# Patient Record
Sex: Female | Born: 1965
Health system: Southern US, Community
[De-identification: ages and names within clinical notes are randomized; demographics above are authoritative.]

## PROBLEM LIST (undated history)

## (undated) DIAGNOSIS — Z789 Other specified health status: Secondary | ICD-10-CM

## (undated) DIAGNOSIS — Z973 Presence of spectacles and contact lenses: Secondary | ICD-10-CM

## (undated) DIAGNOSIS — K219 Gastro-esophageal reflux disease without esophagitis: Secondary | ICD-10-CM

## (undated) DIAGNOSIS — D649 Anemia, unspecified: Secondary | ICD-10-CM

## (undated) DIAGNOSIS — Z8619 Personal history of other infectious and parasitic diseases: Secondary | ICD-10-CM

## (undated) HISTORY — DX: Personal history of other infectious and parasitic diseases: Z86.19

## (undated) HISTORY — PX: FRACTURE SURGERY: SHX138

## (undated) HISTORY — PX: HEMORROIDECTOMY: SUR656

## (undated) HISTORY — PX: COLONOSCOPY: SHX174

## (undated) HISTORY — PX: INTRAUTERINE DEVICE INSERTION: SHX323

## (undated) HISTORY — DX: Gastro-esophageal reflux disease without esophagitis: K21.9

## (undated) HISTORY — DX: Anemia, unspecified: D64.9

---

## 2009-12-01 DIAGNOSIS — R5381 Other malaise: Secondary | ICD-10-CM | POA: Insufficient documentation

## 2009-12-01 DIAGNOSIS — J309 Allergic rhinitis, unspecified: Secondary | ICD-10-CM | POA: Insufficient documentation

## 2009-12-01 DIAGNOSIS — G43909 Migraine, unspecified, not intractable, without status migrainosus: Secondary | ICD-10-CM | POA: Insufficient documentation

## 2009-12-01 DIAGNOSIS — R41840 Attention and concentration deficit: Secondary | ICD-10-CM | POA: Insufficient documentation

## 2009-12-03 DIAGNOSIS — N765 Ulceration of vagina: Secondary | ICD-10-CM | POA: Insufficient documentation

## 2009-12-03 DIAGNOSIS — M546 Pain in thoracic spine: Secondary | ICD-10-CM | POA: Insufficient documentation

## 2009-12-03 DIAGNOSIS — M549 Dorsalgia, unspecified: Secondary | ICD-10-CM | POA: Insufficient documentation

## 2009-12-03 DIAGNOSIS — D649 Anemia, unspecified: Secondary | ICD-10-CM | POA: Insufficient documentation

## 2009-12-13 DIAGNOSIS — E559 Vitamin D deficiency, unspecified: Secondary | ICD-10-CM | POA: Insufficient documentation

## 2009-12-13 DIAGNOSIS — K5900 Constipation, unspecified: Secondary | ICD-10-CM | POA: Insufficient documentation

## 2012-06-11 ENCOUNTER — Other Ambulatory Visit (HOSPITAL_COMMUNITY)
Admission: RE | Admit: 2012-06-11 | Discharge: 2012-06-11 | Disposition: A | Discharge status: Home / Self Care | Payer: Self-pay | Source: Ambulatory Visit | Attending: Family Medicine | Admitting: Family Medicine

## 2012-06-11 DIAGNOSIS — Z124 Encounter for screening for malignant neoplasm of cervix: Secondary | ICD-10-CM | POA: Insufficient documentation

## 2014-07-21 ENCOUNTER — Emergency Department (HOSPITAL_COMMUNITY): Payer: 59

## 2014-07-21 ENCOUNTER — Emergency Department (HOSPITAL_COMMUNITY)
Admission: EM | Admit: 2014-07-21 | Discharge: 2014-07-21 | Disposition: A | Discharge status: Home / Self Care | Payer: 59 | Attending: Emergency Medicine | Admitting: Emergency Medicine

## 2014-07-21 ENCOUNTER — Encounter (HOSPITAL_COMMUNITY): Payer: Self-pay

## 2014-07-21 DIAGNOSIS — Y9389 Activity, other specified: Secondary | ICD-10-CM | POA: Diagnosis not present

## 2014-07-21 DIAGNOSIS — S161XXA Strain of muscle, fascia and tendon at neck level, initial encounter: Secondary | ICD-10-CM | POA: Insufficient documentation

## 2014-07-21 DIAGNOSIS — Y92410 Unspecified street and highway as the place of occurrence of the external cause: Secondary | ICD-10-CM | POA: Diagnosis not present

## 2014-07-21 DIAGNOSIS — S199XXA Unspecified injury of neck, initial encounter: Secondary | ICD-10-CM | POA: Diagnosis present

## 2014-07-21 DIAGNOSIS — R911 Solitary pulmonary nodule: Secondary | ICD-10-CM | POA: Insufficient documentation

## 2014-07-21 DIAGNOSIS — Y998 Other external cause status: Secondary | ICD-10-CM | POA: Diagnosis not present

## 2014-07-21 LAB — I-STAT CHEM 8, ED
BUN: 13 mg/dL (ref 6–23)
Calcium, Ion: 1.16 mmol/L (ref 1.12–1.23)
Chloride: 103 mmol/L (ref 96–112)
Creatinine, Ser: 1 mg/dL (ref 0.50–1.10)
GLUCOSE: 73 mg/dL (ref 70–99)
HEMATOCRIT: 39 % (ref 36.0–46.0)
HEMOGLOBIN: 13.3 g/dL (ref 12.0–15.0)
POTASSIUM: 3.6 mmol/L (ref 3.5–5.1)
SODIUM: 140 mmol/L (ref 135–145)
TCO2: 25 mmol/L (ref 0–100)

## 2014-07-21 LAB — I-STAT BETA HCG BLOOD, ED (MC, WL, AP ONLY): I-stat hCG, quantitative: <5 m[IU]/mL (ref <5)

## 2014-07-21 MED ORDER — MORPHINE SULFATE 4 MG/ML IJ SOLN
4.0000 mg | Freq: Once | INTRAMUSCULAR | Status: AC
  Administered 2014-07-21: 4 mg via INTRAVENOUS
  Filled 2014-07-21: qty 1

## 2014-07-21 MED ORDER — ONDANSETRON HCL 4 MG/2ML IJ SOLN
4.0000 mg | Freq: Once | INTRAMUSCULAR | Status: AC
  Administered 2014-07-21: 4 mg via INTRAVENOUS
  Filled 2014-07-21: qty 2

## 2014-07-21 MED ORDER — IOHEXOL 300 MG/ML  SOLN
80.0000 mL | Freq: Once | INTRAMUSCULAR | Status: AC | PRN
  Administered 2014-07-21: 80 mL via INTRAVENOUS

## 2014-07-21 MED ORDER — OXYCODONE-ACETAMINOPHEN 5-325 MG PO TABS: ORAL_TABLET | ORAL | Status: DC

## 2014-07-21 MED ORDER — SODIUM CHLORIDE 0.9 % IV SOLN
INTRAVENOUS | Status: DC
  Administered 2014-07-21: 08:00:00 via INTRAVENOUS

## 2014-07-21 NOTE — ED Notes (Signed)
Soft c collar given for support and comfort
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
## 2014-07-21 NOTE — Discharge Instructions (Signed)
Your imaging today shows nodules in your lungs. Your primary care doctor must be made aware of this and further imaging may be ordered. Please make them aware that they will need to obtain records for further management.  Take percocet for breakthrough pain, do not drink alcohol, drive, care for children or do other critical tasks while taking percocet.   It is very important that you take deep breaths to prevent lung collapse and infection.  Take 10 deep breaths every hour to prevent lung collapse.  If you develop cough, fever or shortness of breath return immediately to the emergency room.  Do not hesitate to return to the emergency room for any new, worsening or concerning symptoms.  Please obtain primary care using resource guide below. But the minute you were seen in the emergency room and that they will need to obtain records for further outpatient management.   Cervical Sprain A cervical sprain is an injury in the neck in which the strong, fibrous tissues (ligaments) that connect your neck bones stretch or tear. Cervical sprains can range from mild to severe. Severe cervical sprains can cause the neck vertebrae to be unstable. This can lead to damage of the spinal cord and can result in serious nervous system problems. The amount of time it takes for a cervical sprain to get better depends on the cause and extent of the injury. Most cervical sprains heal in 1 to 3 weeks. CAUSES  Severe cervical sprains may be caused by:   Contact sport injuries (such as from football, rugby, wrestling, hockey, auto racing, gymnastics, diving, martial arts, or boxing).   Motor vehicle collisions.   Whiplash injuries. This is an injury from a sudden forward and backward whipping movement of the head and neck.  Falls.  Mild cervical sprains may be caused by:   Being in an awkward position, such as while cradling a telephone between your ear and shoulder.   Sitting in a chair that does not  offer proper support.   Working at a poorly Marketing executive station.   Looking up or down for long periods of time.  SYMPTOMS   Pain, soreness, stiffness, or a burning sensation in the front, back, or sides of the neck. This discomfort may develop immediately after the injury or slowly, 24 hours or more after the injury.   Pain or tenderness directly in the middle of the back of the neck.   Shoulder or upper back pain.   Limited ability to move the neck.   Headache.   Dizziness.   Weakness, numbness, or tingling in the hands or arms.   Muscle spasms.   Difficulty swallowing or chewing.   Tenderness and swelling of the neck.  DIAGNOSIS  Most of the time your health care provider can diagnose a cervical sprain by taking your history and doing a physical exam. Your health care provider will ask about previous neck injuries and any known neck problems, such as arthritis in the neck. X-rays may be taken to find out if there are any other problems, such as with the bones of the neck. Other tests, such as a CT scan or MRI, may also be needed.  TREATMENT  Treatment depends on the severity of the cervical sprain. Mild sprains can be treated with rest, keeping the neck in place (immobilization), and pain medicines. Severe cervical sprains are immediately immobilized. Further treatment is done to help with pain, muscle spasms, and other symptoms and may include:  Medicines, such as pain  relievers, numbing medicines, or muscle relaxants.   Physical therapy. This may involve stretching exercises, strengthening exercises, and posture training. Exercises and improved posture can help stabilize the neck, strengthen muscles, and help stop symptoms from returning.  HOME CARE INSTRUCTIONS   Put ice on the injured area.   Put ice in a plastic bag.   Place a towel between your skin and the bag.   Leave the ice on for 15-20 minutes, 3-4 times a day.   If your injury was  severe, you may have been given a cervical collar to wear. A cervical collar is a two-piece collar designed to keep your neck from moving while it heals.  Do not remove the collar unless instructed by your health care provider.  If you have long hair, keep it outside of the collar.  Ask your health care provider before making any adjustments to your collar. Minor adjustments may be required over time to improve comfort and reduce pressure on your chin or on the back of your head.  Ifyou are allowed to remove the collar for cleaning or bathing, follow your health care provider's instructions on how to do so safely.  Keep your collar clean by wiping it with mild soap and water and drying it completely. If the collar you have been given includes removable pads, remove them every 1-2 days and hand wash them with soap and water. Allow them to air dry. They should be completely dry before you wear them in the collar.  If you are allowed to remove the collar for cleaning and bathing, wash and dry the skin of your neck. Check your skin for irritation or sores. If you see any, tell your health care provider.  Do not drive while wearing the collar.   Only take over-the-counter or prescription medicines for pain, discomfort, or fever as directed by your health care provider.   Keep all follow-up appointments as directed by your health care provider.   Keep all physical therapy appointments as directed by your health care provider.   Make any needed adjustments to your workstation to promote good posture.   Avoid positions and activities that make your symptoms worse.   Warm up and stretch before being active to help prevent problems.  SEEK MEDICAL CARE IF:   Your pain is not controlled with medicine.   You are unable to decrease your pain medicine over time as planned.   Your activity level is not improving as expected.  SEEK IMMEDIATE MEDICAL CARE IF:   You develop any  bleeding.  You develop stomach upset.  You have signs of an allergic reaction to your medicine.   Your symptoms get worse.   You develop new, unexplained symptoms.   You have numbness, tingling, weakness, or paralysis in any part of your body.  MAKE SURE YOU:   Understand these instructions.  Will watch your condition.  Will get help right away if you are not doing well or get worse. Document Released: 03/19/2007 Document Revised: 05/27/2013 Document Reviewed: 11/27/2012 [facility] Patient Information 2015 [place], [place]. This information is not intended to replace advice given to you by your health care provider. Make sure you discuss any questions you have with your health care provider.  Pulmonary Nodule A pulmonary nodule is a small, round growth of tissue in the lung. Pulmonary nodules can range in size from less than 1/5 inch (4 mm) to a little bigger than an inch (25 mm). Most pulmonary nodules are detected when imaging  tests of the lung are being performed for a different problem. Pulmonary nodules are usually not cancerous (benign). However, some pulmonary nodules are cancerous (malignant). Follow-up treatment or testing is based on the size of the pulmonary nodule and your risk of getting lung cancer.  CAUSES Benign pulmonary nodules can be caused by various things. Some of the causes include:   Bacterial, fungal, or viral infections. This is usually an old infection that is no longer active, but it can sometimes be a current, active infection.  A benign mass of tissue.  Inflammation from conditions such as rheumatoid arthritis.   Abnormal blood vessels in the lungs. Malignant pulmonary nodules can result from lung cancer or from cancers that spread to the lung from other places in the body. SIGNS AND SYMPTOMS Pulmonary nodules usually do not cause symptoms. DIAGNOSIS Most often, pulmonary nodules are found incidentally when an X-ray or CT scan is performed to look  for some other problem in the lung area. To help determine whether a pulmonary nodule is benign or malignant, your health care provider will take a medical history and order a variety of tests. Tests done may include:   Blood tests.  A skin test called a tuberculin test. This test is used to determine if you have been exposed to the germ that causes tuberculosis.   Chest X-rays. If possible, a new X-ray may be compared with X-rays you have had in the past.   CT scan. This test shows smaller pulmonary nodules more clearly than an X-ray.   Positron emission tomography (PET) scan. In this test, a safe amount of a radioactive substance is injected into the bloodstream. Then, the scan takes a picture of the pulmonary nodule. The radioactive substance is eliminated from your body in your urine.   Biopsy. A tiny piece of the pulmonary nodule is removed so it can be checked under a microscope. TREATMENT  Pulmonary nodules that are benign normally do not require any treatment because they usually do not cause symptoms or breathing problems. Your health care provider may want to monitor the pulmonary nodule through follow-up CT scans. The frequency of these CT scans will vary based on the size of the nodule and the risk factors for lung cancer. For example, CT scans will need to be done more frequently if the pulmonary nodule is larger and if you have a history of smoking and a family history of cancer. Further testing or biopsies may be done if any follow-up CT scan shows that the size of the pulmonary nodule has increased. HOME CARE INSTRUCTIONS  Only take over-the-counter or prescription medicines as directed by your health care provider.  Keep all follow-up appointments with your health care provider. SEEK MEDICAL CARE IF:  You have trouble breathing when you are active.   You feel sick or unusually tired.   You do not feel like eating.   You lose weight without trying to.   You  develop chills or night sweats.  SEEK IMMEDIATE MEDICAL CARE IF:  You cannot catch your breath, or you begin wheezing.   You cannot stop coughing.   You cough up blood.   You become dizzy or feel like you are going to pass out.   You have sudden chest pain.   You have a fever or persistent symptoms for more than 2-3 days.   You have a fever and your symptoms suddenly get worse. MAKE SURE YOU:  Understand these instructions.  Will watch your condition.  Will get help right away if you are not doing well or get worse. Document Released: 03/19/2009 Document Revised: 01/22/2013 Document Reviewed: 11/11/2012 [facility] Patient Information 2015 [place], [place]. This information is not intended to replace advice given to you by your health care provider. Make sure you discuss any questions you have with your health care provider.   Emergency Department Resource Guide 1) Find a Doctor and Pay Out of Pocket Although you won't have to find out who is covered by your insurance plan, it is a good idea to ask around and get recommendations. You will then need to call the office and see if the doctor you have chosen will accept you as a new patient and what types of options they offer for patients who are self-pay. Some doctors offer discounts or will set up payment plans for their patients who do not have insurance, but you will need to ask so you aren't surprised when you get to your appointment.  2) Contact Your Local Health Department Not all health departments have doctors that can see patients for sick visits, but many do, so it is worth a call to see if yours does. If you don't know where your local health department is, you can check in your phone book. The CDC also has a tool to help you locate your state's health department, and many state websites also have listings of all of their local health departments.  3) Find a Walk-in Clinic If your illness is not likely to be very severe  or complicated, you may want to try a walk in clinic. These are popping up all over the country in pharmacies, drugstores, and shopping centers. They're usually staffed by nurse practitioners or physician assistants that have been trained to treat common illnesses and complaints. They're usually fairly quick and inexpensive. However, if you have serious medical issues or chronic medical problems, these are probably not your best option.  No Primary Care Doctor: - Call Health Connect at  [phone] - they can help you locate a primary care doctor that  accepts your insurance, provides certain services, etc. - Physician Referral Service- [phone]  Chronic Pain Problems: Organization         Address  Phone   Notes  [facility]  [phone] Patients need to be referred by their primary care doctor.   Medication Assistance: Organization         Address  Phone   Notes  [facility] Medication [facility] [address] [place], [place] [address] [phone] --Must be a resident of [facility] -- Must have NO insurance coverage whatsoever (no Medicaid/ Medicare, etc.) -- The pt. MUST have a primary care doctor that directs their care regularly and follows them in the community   [facility]ist  [phone]   [employer]  [phone]    Agencies that provide inexpensive medical care: Organization         Address  Phone   Notes  [facility]  [phone]   [facility]    [phone]   [facility] [address] [place], [place] [address] [phone]   [facility] [address] [place], [place] [phone]   Planned Parenthood    [phone]   [facility]    [phone]   Community Health and [facility]  [address], [place] Phone:  [phone], Fax:  [phone] Hours of Operation:  9 am - 6 pm, M-F.  Also accepts  Medicaid/Medicare and self-pay.  [facility]  [address], [place] Phone: [phone], Fax: [phone]. Hours of Operation:  8:30 am - 5:30 pm, M-F.  Also accepts Medicaid and self-pay.  [facility] [place] [address], [place] Phone: [phone]   [facility] [address] Natasha Bence [place], [place] [phone], Ext. [phone] Mondays & Thursdays: 7-9 AM.  First 15 patients are seen on a first come, first serve basis.    Medicaid-accepting [facility] Providers:  Organization         Address  Phone   Notes  [facility] [address], [place] [phone] Also accepts self-pay patients.  [facility] [address] Laurell Josephs [place], [place]  [phone]   [facility] [address], [place] [phone]   [facility] [address] [place] [phone]   Renaye Rakers [address], [place]   [phone] Only accepts [place] Access [place] patients after they have their name applied to their card.   Self-Pay (no insurance) in [facility]:  Organization         Address  Phone   Notes  Sickle Cell Patients, [facility] [address] [place], [place] [phone]   [facility] [address] [place], [place] [phone]   [facility] [place] [facility]  [address], [place] [phone]   [facility]/Dr. Osei-Bonsu  [address], [place] or [address], [place] [phone] Phone number for both [place] and [place] locations is the same.  [facility]gent [facility] and [facility] [address], [place] [phone]   [facility] [address], [place] or [address] [phone]   [facility] [address] [place] [phone], phone; [phone], fax Sees patients 1st and 3rd Saturday of every month.  Must not qualify for public or private insurance (i.e. Medicaid, Medicare, [place] Health Choice, Veterans' Benefits)  Household income should be no more than 200% of the poverty level The clinic cannot treat you if you are pregnant or think you are pregnant  Sexually transmitted diseases are not treated at the clinic.    Dental Care: Organization         Address  Phone  Notes  [facility] Department of [facility] [address] [place], [place] [phone] Accepts children up to age 76 who are enrolled in [place] or [place] Health Choice; pregnant women with a Medicaid card; and children who have applied for Medicaid or [place] Health Choice, but were declined, whose parents can pay a reduced fee at time of service.  [facility] Department of [facility]  [address], [place] [phone] Accepts children up to age 74 who are enrolled in [place] or [place] Health Choice; pregnant women with a Medicaid card; and children who have applied for Medicaid or  Health Choice, but were declined, whose parents can pay a reduced fee at time of service.  [facility]  [address] [place], [place] [phone] Patients are seen by appointment only. Walk-ins are not accepted. [facility] will see patients 36 years of age and older. Monday - Tuesday (8am-5pm) Most Wednesdays (8:30-5pm) $30 per visit, cash only  [facility] Adult Dental Access PRO[facility]  [address], [facility] [phone] Patients are seen by appointment only. Walk-ins are not accepted. [facility] will see patients 81 years of age and older. One Wednesday Evening (Monthly: Volunteer Based).  $30 per visit, cash only  [employer]  [phone] for adults; Children under age 41, call Graduate Pediatric Dentistry at [phone]. Children aged  60-14, please call [phone] to request a pediatric application.  Dental services are provided in all areas of dental care including fillings, crowns and bridges, complete and partial dentures, implants, gum treatment, root canals, and extractions. Preventive care is also provided. Treatment is provided to both adults and children. Patients are selected via a lottery and there is often a waiting list.   [facility] [address], [place]  [phone] [email or web address]   [facility] [address] [place], [place] [phone], Ext. [phone] Second and Fourth Thursday of each month, opens at 6:30 AM; Clinic ends at 9 AM.  Patients are seen on a first-come first-served basis, and a limited number are seen during each clinic.   [facility]  [address] Ether Griffins [place], [place] [phone]   Eligibility Requirements You must have lived in [place], [place], or [place] counties for at least the last three months.   You cannot be eligible for state or federal sponsored [place], including [employer], [place], or [employer].   You generally cannot be eligible for healthcare insurance through your employer.    How to apply: Eligibility screenings are held every Tuesday and Wednesday afternoon from 1:00 pm until 4:00 pm. You do not need an appointment for the interview!  [facility] [address], [place], [place] [phone]   [facility] Health Department  [phone]   [facility] Health Department  [phone]   [facility] Health Department  [phone]    Behavioral Health Resources in the Community: Intensive Outpatient Programs Organization         Address  Phone  Notes  [facility] [address] [place], [place] [phone]   [facility] [address] [place], [place] [phone]   [facility] [address],  [place], [place]  [phone]   [facility] Mental Health [address]  [place], [place] [phone] or [phone]   Substance Abuse Resources Organization         Address  Phone  Notes  [facility]  [phone]   [facility]  [phone]   [facility]  [phone]   Floydene Flock  [phone]   Residential & Outpatient Substance Abuse Program  [phone]   Psychological Services Organization         Address  Phone  Notes  [facility]al Health  [phone]   [facility]  [phone]   [facility] Mental Health [address] [place] [phone] or [phone]    Mobile Crisis Teams Organization         Address  Phone  Notes  [facility], Mobile Crisis Care Unit  [phone]   [facility]  [address] [place], [place] [phone]   [facility] [address] [place] [phone]    Self-Help/Support Groups Organization  Address  Phone             Notes  Mental Health Assoc. of  - variety of support groups  336- I7437963 Call for more information  Narcotics Anonymous (NA), [facility] [address], [employer] [place]  2 meetings at this location   Statistician         Address  Phone  Notes  [facility] [address],    [place]  [phone]   [facility]  [address] [place] [address], [place], [place] [phone]   [facility] [address] [place], [place] [phone] Admissions: 8am-3pm M-F  [facility] [address],    [place], [place] [phone]   [facility] [address] [place], [place], [place] [phone]   [facility] [address],  [place], [place] [phone]   [facility] - Intensive Outpatient [address]ure[address], [place], [place] [phone]   [facility]  (Addiction Recovery Care [facility]oc.) [address] [place]  [place], [place] [phone] or [phone]   [facility] ([facility]) [address], [place], [place] [phone] Accepts Medicaid  [facility] [address],  [place] [phone] Substance Abuse/Addiction Treatment   [facility] Organization         Address  Phone  Notes  [facility]  5407583440   Angie Fava, PhD [address] Ervin Knack [place], [place]   [phone] or [phone]   [facility]   [address] [place], [place] [phone]   [facility] [address] [place], [place] [phone] Insurance/Medicaid/sponsorship through [facility] and Families [address]                                    [place], [place] [phone] Therapy/tele-psych/case  [facility] [address], [place] [phone]    Dr. Lolly Mustache  [phone]   [facility] [place]  [employer] [facility] Dept. 1) [address], [place] 2) [address] [place] 3)  [address], [place] [phone]  [phone]   [facility] Child Abuse Hotline 9066406669 or [phone] (After Hours)

## 2014-07-21 NOTE — ED Notes (Signed)
Pt still in CT

## 2014-07-21 NOTE — ED Notes (Signed)
Pt escorted to discharge window. Pt verbalized understanding discharge instructions. In no acute distress.  

## 2014-07-21 NOTE — ED Notes (Signed)
Bed: WA03 Expected date: 07/21/14 Expected time: 6:15 AM Means of arrival: Ambulance Comments: Neck and back pain

## 2014-07-21 NOTE — ED Notes (Signed)
Pt alert and oriented x4. Respirations even and unlabored, bilateral symmetrical rise and fall of chest. Skin warm and dry. In no acute distress. Denies needs.   

## 2014-07-26 ENCOUNTER — Encounter (HOSPITAL_COMMUNITY): Payer: Self-pay | Admitting: Emergency Medicine

## 2014-07-26 ENCOUNTER — Emergency Department (HOSPITAL_COMMUNITY)
Admission: EM | Admit: 2014-07-26 | Discharge: 2014-07-26 | Disposition: A | Discharge status: Home / Self Care | Payer: BC Managed Care – PPO | Attending: Emergency Medicine | Admitting: Emergency Medicine

## 2014-07-26 ENCOUNTER — Emergency Department (HOSPITAL_COMMUNITY): Payer: BC Managed Care – PPO

## 2014-07-26 DIAGNOSIS — S59902A Unspecified injury of left elbow, initial encounter: Secondary | ICD-10-CM | POA: Diagnosis not present

## 2014-07-26 DIAGNOSIS — S6292XB Unspecified fracture of left wrist and hand, initial encounter for open fracture: Secondary | ICD-10-CM

## 2014-07-26 DIAGNOSIS — S4992XA Unspecified injury of left shoulder and upper arm, initial encounter: Secondary | ICD-10-CM | POA: Insufficient documentation

## 2014-07-26 DIAGNOSIS — Y92003 Bedroom of unspecified non-institutional (private) residence as the place of occurrence of the external cause: Secondary | ICD-10-CM | POA: Diagnosis not present

## 2014-07-26 DIAGNOSIS — W06XXXA Fall from bed, initial encounter: Secondary | ICD-10-CM | POA: Insufficient documentation

## 2014-07-26 DIAGNOSIS — S62632B Displaced fracture of distal phalanx of right middle finger, initial encounter for open fracture: Secondary | ICD-10-CM | POA: Diagnosis not present

## 2014-07-26 DIAGNOSIS — S6992XA Unspecified injury of left wrist, hand and finger(s), initial encounter: Secondary | ICD-10-CM | POA: Diagnosis present

## 2014-07-26 DIAGNOSIS — Y998 Other external cause status: Secondary | ICD-10-CM | POA: Diagnosis not present

## 2014-07-26 DIAGNOSIS — Y9389 Activity, other specified: Secondary | ICD-10-CM | POA: Insufficient documentation

## 2014-07-26 MED ORDER — ONDANSETRON HCL 4 MG/2ML IJ SOLN: 4.0000 mg | Freq: Once | INTRAMUSCULAR | Status: DC

## 2014-07-26 MED ORDER — HYDROCODONE-ACETAMINOPHEN 5-325 MG PO TABS: 1.0000 | ORAL_TABLET | Freq: Four times a day (QID) | ORAL | Status: DC | PRN

## 2014-07-26 MED ORDER — HYDROCODONE-ACETAMINOPHEN 5-325 MG PO TABS
2.0000 | ORAL_TABLET | Freq: Once | ORAL | Status: AC
  Administered 2014-07-26: 2 via ORAL
  Filled 2014-07-26: qty 2

## 2014-07-26 MED ORDER — ONDANSETRON 4 MG PO TBDP
4.0000 mg | ORAL_TABLET | Freq: Once | ORAL | Status: AC
  Administered 2014-07-26: 4 mg via ORAL
  Filled 2014-07-26: qty 1

## 2014-07-26 MED ORDER — OXYCODONE-ACETAMINOPHEN 5-325 MG PO TABS
2.0000 | ORAL_TABLET | Freq: Once | ORAL | Status: DC
  Filled 2014-07-26: qty 2

## 2014-07-26 NOTE — ED Notes (Signed)
Ortho tech at bedside 

## 2014-07-26 NOTE — Discharge Instructions (Signed)
Cast or Splint Care °Casts and splints support injured limbs and keep bones from moving while they heal. It is important to care for your cast or splint at home.   °HOME CARE INSTRUCTIONS °· Keep the cast or splint uncovered during the drying period. It can take 24 to 48 hours to dry if it is made of plaster. A fiberglass cast will dry in less than 1 hour. °· Do not rest the cast on anything harder than a pillow for the first 24 hours. °· Do not put weight on your injured limb or apply pressure to the cast until your health care provider gives you permission. °· Keep the cast or splint dry. Wet casts or splints can lose their shape and may not support the limb as well. A wet cast that has lost its shape can also create harmful pressure on your skin when it dries. Also, wet skin can become infected. °· Cover the cast or splint with a plastic bag when bathing or when out in the rain or snow. If the cast is on the trunk of the body, take sponge baths until the cast is removed. °· If your cast does become wet, dry it with a towel or a blow dryer on the cool setting only. °· Keep your cast or splint clean. Soiled casts may be wiped with a moistened cloth. °· Do not place any hard or soft foreign objects under your cast or splint, such as cotton, toilet paper, lotion, or powder. °· Do not try to scratch the skin under the cast with any object. The object could get stuck inside the cast. Also, scratching could lead to an infection. If itching is a problem, use a blow dryer on a cool setting to relieve discomfort. °· Do not trim or cut your cast or remove padding from inside of it. °· Exercise all joints next to the injury that are not immobilized by the cast or splint. For example, if you have a long leg cast, exercise the hip joint and toes. If you have an arm cast or splint, exercise the shoulder, elbow, thumb, and fingers. °· Elevate your injured arm or leg on 1 or 2 pillows for the first 1 to 3 days to decrease  swelling and pain. It is best if you can comfortably elevate your cast so it is higher than your heart. °SEEK MEDICAL CARE IF:  °· Your cast or splint cracks. °· Your cast or splint is too tight or too loose. °· You have unbearable itching inside the cast. °· Your cast becomes wet or develops a soft spot or area. °· You have a bad smell coming from inside your cast. °· You get an object stuck under your cast. °· Your skin around the cast becomes red or raw. °· You have new pain or worsening pain after the cast has been applied. °SEEK IMMEDIATE MEDICAL CARE IF:  °· You have fluid leaking through the cast. °· You are unable to move your fingers or toes. °· You have discolored (blue or white), cool, painful, or very swollen fingers or toes beyond the cast. °· You have tingling or numbness around the injured area. °· You have severe pain or pressure under the cast. °· You have any difficulty with your breathing or have shortness of breath. °· You have chest pain. °Document Released: 05/19/2000 Document Revised: 03/12/2013 Document Reviewed: 11/28/2012 °[employer]® Patient Information ©2015 [employer]. This information is not intended to replace advice given to you by your health care   provider. Make sure you discuss any questions you have with your health care provider.  Hand Fracture, Metacarpals Fractures of metacarpals are breaks in the bones of the hand. They extend from the knuckles to the wrist. These bones can undergo many types of fractures. There are different ways of treating these fractures, all of which may be correct. TREATMENT  Hand fractures can be treated with:   Non-reduction - The fracture is casted without changing the positions of the fracture (bone pieces) involved. This fracture is usually left in a cast for 4 to 6 weeks or as your caregiver thinks necessary.  Closed reduction - The bones are moved back into position without surgery and then casted.  ORIF (open reduction and internal  fixation) - The fracture site is opened and the bone pieces are fixed into place with some type of hardware, such as screws, etc. They are then casted. Your caregiver will discuss the type of fracture you have and the treatment that should be best for that problem. If surgery is chosen, let your caregivers know about the following.  LET YOUR CAREGIVERS KNOW ABOUT:  Allergies.  Medications you are taking, including herbs, eye drops, over the counter medications, and creams.  Use of steroids (by mouth or creams).  Previous problems with anesthetics or novocaine.  Possibility of pregnancy.  History of blood clots (thrombophlebitis).  History of bleeding or blood problems.  Previous surgeries.  Other health problems. AFTER THE PROCEDURE After surgery, you will be taken to the recovery area where a nurse will watch and check your progress. Once you are awake, stable, and taking fluids well, barring other problems, you'll be allowed to go home. Once home, an ice pack applied to your operative site may help with pain and keep the swelling down. HOME CARE INSTRUCTIONS   Follow your caregiver's instructions as to activities, exercises, physical therapy, and driving a car.  Daily exercise is helpful for keeping range of motion and strength. Exercise as instructed.  To lessen swelling, keep the injured hand elevated above the level of your heart as much as possible.  Apply ice to the injury for 15-20 minutes each hour while awake for the first 2 days. Put the ice in a plastic bag and place a thin towel between the bag of ice and your cast.  Move the fingers of your casted hand several times a day.  If a plaster or fiberglass cast was applied:  Do not try to scratch the skin under the cast using a sharp or pointed object.  Check the skin around the cast every day. You may put lotion on red or sore areas.  Keep your cast dry. Your cast can be protected during bathing with a plastic bag.  Do not put your cast into the water.  If a plaster splint was applied:  Wear your splint for as long as directed by your caregiver or until seen again.  Do not get your splint wet. Protect it during bathing with a plastic bag.  You may loosen the elastic bandage around the splint if your fingers start to get numb, tingle, get cold or turn blue.  Do not put pressure on your cast or splint; this may cause it to break. Especially, do not lean plaster casts on hard surfaces for 24 hours after application.  Take medications as directed by your caregiver.  Only take over-the-counter or prescription medicines for pain, discomfort, or fever as directed by your caregiver.  Follow-up as provided by  your caregiver. This is very important in order to avoid permanent injury or disability and chronic pain. SEEK MEDICAL CARE IF:   Increased bleeding (more than a small spot) from beneath your cast or splint if there is beneath the cast as with an open reduction.  Redness, swelling, or increasing pain in the wound or from beneath your cast or splint.  Pus coming from wound or from beneath your cast or splint.  An unexplained oral temperature above 102 F (38.9 C) develops, or as your caregiver suggests.  A foul smell coming from the wound or dressing or from beneath your cast or splint.  You have a problem moving any of your fingers. SEEK IMMEDIATE MEDICAL CARE IF:   You develop a rash  You have difficulty breathing  You have any allergy problems If you do not have a window in your cast for observing the wound, a discharge or minor bleeding may show up as a stain on the outside of your cast. Report these findings to your caregiver. MAKE SURE YOU:   Understand these instructions.  Will watch your condition.  Will get help right away if you are not doing well or get worse. Document Released: 05/22/2005 Document Revised: 08/14/2011 Document Reviewed: 01/09/2008 [facility] Patient  Information 2015 [place], [place]. This information is not intended to replace advice given to you by your health care provider. Make sure you discuss any questions you have with your health care provider.
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
## 2014-07-27 ENCOUNTER — Other Ambulatory Visit: Payer: Self-pay | Admitting: Orthopedic Surgery

## 2014-07-27 ENCOUNTER — Encounter (HOSPITAL_BASED_OUTPATIENT_CLINIC_OR_DEPARTMENT_OTHER): Payer: Self-pay | Admitting: *Deleted

## 2014-07-28 ENCOUNTER — Encounter (HOSPITAL_BASED_OUTPATIENT_CLINIC_OR_DEPARTMENT_OTHER)
Admission: RE | Disposition: A | Discharge status: Home / Self Care | Payer: Self-pay | Source: Ambulatory Visit | Attending: Orthopedic Surgery

## 2014-07-28 ENCOUNTER — Encounter (HOSPITAL_BASED_OUTPATIENT_CLINIC_OR_DEPARTMENT_OTHER): Payer: Self-pay | Admitting: *Deleted

## 2014-07-28 ENCOUNTER — Ambulatory Visit (HOSPITAL_BASED_OUTPATIENT_CLINIC_OR_DEPARTMENT_OTHER): Payer: BC Managed Care – PPO | Admitting: Anesthesiology

## 2014-07-28 ENCOUNTER — Ambulatory Visit (HOSPITAL_BASED_OUTPATIENT_CLINIC_OR_DEPARTMENT_OTHER)
Admission: RE | Admit: 2014-07-28 | Discharge: 2014-07-28 | Disposition: A | Discharge status: Home / Self Care | Payer: BC Managed Care – PPO | Source: Ambulatory Visit | Attending: Orthopedic Surgery | Admitting: Orthopedic Surgery

## 2014-07-28 DIAGNOSIS — Y9289 Other specified places as the place of occurrence of the external cause: Secondary | ICD-10-CM | POA: Insufficient documentation

## 2014-07-28 DIAGNOSIS — S62613A Displaced fracture of proximal phalanx of left middle finger, initial encounter for closed fracture: Secondary | ICD-10-CM | POA: Insufficient documentation

## 2014-07-28 DIAGNOSIS — Y939 Activity, unspecified: Secondary | ICD-10-CM | POA: Diagnosis not present

## 2014-07-28 DIAGNOSIS — S62615A Displaced fracture of proximal phalanx of left ring finger, initial encounter for closed fracture: Secondary | ICD-10-CM | POA: Diagnosis not present

## 2014-07-28 DIAGNOSIS — S62617A Displaced fracture of proximal phalanx of left little finger, initial encounter for closed fracture: Secondary | ICD-10-CM | POA: Insufficient documentation

## 2014-07-28 DIAGNOSIS — Z888 Allergy status to other drugs, medicaments and biological substances status: Secondary | ICD-10-CM | POA: Insufficient documentation

## 2014-07-28 HISTORY — PX: CLOSED REDUCTION METACARPAL WITH PERCUTANEOUS PINNING: SHX5613

## 2014-07-28 HISTORY — DX: Other specified health status: Z78.9

## 2014-07-28 HISTORY — DX: Presence of spectacles and contact lenses: Z97.3

## 2014-07-28 SURGERY — CLOSED REDUCTION, FRACTURE, METACARPAL BONE, WITH PERCUTANEOUS PINNING
Anesthesia: General | Site: Finger | Laterality: Left

## 2014-07-28 MED ORDER — HYDROMORPHONE HCL 1 MG/ML IJ SOLN: 0.2500 mg | INTRAMUSCULAR | Status: DC | PRN

## 2014-07-28 MED ORDER — ONDANSETRON HCL 4 MG/2ML IJ SOLN
INTRAMUSCULAR | Status: DC | PRN
  Administered 2014-07-28: 4 mg via INTRAVENOUS

## 2014-07-28 MED ORDER — FENTANYL CITRATE 0.05 MG/ML IJ SOLN
50.0000 ug | INTRAMUSCULAR | Status: DC | PRN
  Administered 2014-07-28: 100 ug via INTRAVENOUS

## 2014-07-28 MED ORDER — PROPOFOL INFUSION 10 MG/ML OPTIME
INTRAVENOUS | Status: DC | PRN
  Administered 2014-07-28: 140 ug/kg/min via INTRAVENOUS

## 2014-07-28 MED ORDER — MIDAZOLAM HCL 2 MG/2ML IJ SOLN
INTRAMUSCULAR | Status: AC
  Filled 2014-07-28: qty 2

## 2014-07-28 MED ORDER — FENTANYL CITRATE 0.05 MG/ML IJ SOLN
INTRAMUSCULAR | Status: AC
  Filled 2014-07-28: qty 2

## 2014-07-28 MED ORDER — ONDANSETRON HCL 4 MG/2ML IJ SOLN: 4.0000 mg | Freq: Once | INTRAMUSCULAR | Status: DC | PRN

## 2014-07-28 MED ORDER — MIDAZOLAM HCL 2 MG/2ML IJ SOLN
1.0000 mg | INTRAMUSCULAR | Status: DC | PRN
  Administered 2014-07-28: 2 mg via INTRAVENOUS

## 2014-07-28 MED ORDER — MEPERIDINE HCL 25 MG/ML IJ SOLN: 6.2500 mg | INTRAMUSCULAR | Status: DC | PRN

## 2014-07-28 MED ORDER — LACTATED RINGERS IV SOLN
INTRAVENOUS | Status: DC
Start: 2014-07-28 — End: 2014-07-28
  Administered 2014-07-28 (×2): via INTRAVENOUS

## 2014-07-28 MED ORDER — CEFAZOLIN SODIUM-DEXTROSE 2-3 GM-% IV SOLR
2.0000 g | INTRAVENOUS | Status: AC
  Administered 2014-07-28: 2 g via INTRAVENOUS

## 2014-07-28 MED ORDER — CEFAZOLIN SODIUM-DEXTROSE 2-3 GM-% IV SOLR
INTRAVENOUS | Status: AC
  Filled 2014-07-28: qty 50

## 2014-07-28 MED ORDER — HYDROCODONE-ACETAMINOPHEN 5-325 MG PO TABS: ORAL_TABLET | ORAL | Status: DC

## 2014-07-28 MED ORDER — CHLORHEXIDINE GLUCONATE 4 % EX LIQD: 60.0000 mL | Freq: Once | CUTANEOUS | Status: DC

## 2014-07-28 MED ORDER — FENTANYL CITRATE 0.05 MG/ML IJ SOLN
INTRAMUSCULAR | Status: AC
  Filled 2014-07-28: qty 6

## 2014-07-28 SURGICAL SUPPLY — 58 items
BANDAGE ELASTIC 3 VELCRO ST LF (GAUZE/BANDAGES/DRESSINGS) ×3 IMPLANT
BLADE MINI RND TIP GREEN BEAV (BLADE) IMPLANT
BLADE SURG 15 STRL LF DISP TIS (BLADE) ×1 IMPLANT
BLADE SURG 15 STRL SS (BLADE) ×2
BNDG ELASTIC 2 VLCR STRL LF (GAUZE/BANDAGES/DRESSINGS) IMPLANT
BNDG ESMARK 4X9 LF (GAUZE/BANDAGES/DRESSINGS) ×3 IMPLANT
BNDG GAUZE ELAST 4 BULKY (GAUZE/BANDAGES/DRESSINGS) ×3 IMPLANT
CHLORAPREP W/TINT 26ML (MISCELLANEOUS) ×3 IMPLANT
CORDS BIPOLAR (ELECTRODE) IMPLANT
COVER BACK TABLE 60X90IN (DRAPES) ×3 IMPLANT
COVER MAYO STAND STRL (DRAPES) ×3 IMPLANT
CUFF TOURNIQUET SINGLE 18IN (TOURNIQUET CUFF) ×3 IMPLANT
DRAPE EXTREMITY T 121X128X90 (DRAPE) ×3 IMPLANT
DRAPE OEC MINIVIEW 54X84 (DRAPES) ×3 IMPLANT
DRAPE SURG 17X23 STRL (DRAPES) ×3 IMPLANT
GAUZE SPONGE 4X4 12PLY STRL (GAUZE/BANDAGES/DRESSINGS) ×3 IMPLANT
GAUZE XEROFORM 1X8 LF (GAUZE/BANDAGES/DRESSINGS) ×3 IMPLANT
GLOVE BIO SURGEON STRL SZ7 (GLOVE) ×3 IMPLANT
GLOVE BIO SURGEON STRL SZ7.5 (GLOVE) ×3 IMPLANT
GLOVE BIOGEL PI IND STRL 7.5 (GLOVE) ×1 IMPLANT
GLOVE BIOGEL PI IND STRL 8 (GLOVE) ×1 IMPLANT
GLOVE BIOGEL PI IND STRL 8.5 (GLOVE) ×1 IMPLANT
GLOVE BIOGEL PI INDICATOR 7.5 (GLOVE) ×2
GLOVE BIOGEL PI INDICATOR 8 (GLOVE) ×2
GLOVE BIOGEL PI INDICATOR 8.5 (GLOVE) ×2
GLOVE SURG ORTHO 8.0 STRL STRW (GLOVE) ×3 IMPLANT
GOWN STRL REUS W/ TWL LRG LVL3 (GOWN DISPOSABLE) ×2 IMPLANT
GOWN STRL REUS W/ TWL XL LVL3 (GOWN DISPOSABLE) ×1 IMPLANT
GOWN STRL REUS W/TWL LRG LVL3 (GOWN DISPOSABLE) ×4
GOWN STRL REUS W/TWL XL LVL3 (GOWN DISPOSABLE) ×8 IMPLANT
K-WIRE .035X4 (WIRE) ×21 IMPLANT
NEEDLE HYPO 22GX1.5 SAFETY (NEEDLE) IMPLANT
NEEDLE HYPO 25X1 1.5 SAFETY (NEEDLE) IMPLANT
NS IRRIG 1000ML POUR BTL (IV SOLUTION) ×3 IMPLANT
PACK BASIN DAY SURGERY FS (CUSTOM PROCEDURE TRAY) ×3 IMPLANT
PAD CAST 3X4 CTTN HI CHSV (CAST SUPPLIES) IMPLANT
PAD CAST 4YDX4 CTTN HI CHSV (CAST SUPPLIES) IMPLANT
PADDING CAST ABS 4INX4YD NS (CAST SUPPLIES) ×2
PADDING CAST ABS COTTON 4X4 ST (CAST SUPPLIES) ×1 IMPLANT
PADDING CAST COTTON 3X4 STRL (CAST SUPPLIES)
PADDING CAST COTTON 4X4 STRL (CAST SUPPLIES)
SLEEVE SCD COMPRESS KNEE MED (MISCELLANEOUS) ×3 IMPLANT
SLING ARM MED ADULT FOAM STRAP (SOFTGOODS) ×3 IMPLANT
SPLINT PLASTER CAST XFAST 3X15 (CAST SUPPLIES) ×20 IMPLANT
SPLINT PLASTER CAST XFAST 4X15 (CAST SUPPLIES) IMPLANT
SPLINT PLASTER XTRA FAST SET 4 (CAST SUPPLIES)
SPLINT PLASTER XTRA FASTSET 3X (CAST SUPPLIES) ×40
STOCKINETTE 4X48 STRL (DRAPES) ×3 IMPLANT
SUT ETHILON 3 0 PS 1 (SUTURE) IMPLANT
SUT ETHILON 4 0 PS 2 18 (SUTURE) IMPLANT
SUT MERSILENE 4 0 P 3 (SUTURE) IMPLANT
SUT VIC AB 3-0 PS1 18 (SUTURE)
SUT VIC AB 3-0 PS1 18XBRD (SUTURE) IMPLANT
SUT VICRYL 4-0 PS2 18IN ABS (SUTURE) IMPLANT
SYR BULB 3OZ (MISCELLANEOUS) IMPLANT
SYR CONTROL 10ML LL (SYRINGE) IMPLANT
TOWEL OR 17X24 6PK STRL BLUE (TOWEL DISPOSABLE) ×6 IMPLANT
UNDERPAD 30X30 INCONTINENT (UNDERPADS AND DIAPERS) ×3 IMPLANT

## 2014-07-28 NOTE — Op Note (Signed)
[id number] 

## 2014-07-28 NOTE — Brief Op Note (Signed)
07/28/2014  3:31 PM  PATIENT:  Altovise Viveiros  49 y.o. female  PRE-OPERATIVE DIAGNOSIS:  DISPLACED PROXIMAL PHALANX FRACTURES LEFT MIDDLE RING AND SMALL FINGERS  POST-OPERATIVE DIAGNOSIS:  Displaced Proximal Phalanx Fractures Left Hand  PROCEDURE:  Procedure(s): CLOSED REDUCTION METACARPAL WITH PERCUTANEOUS PINNING PROXIMAL PHALANX FRACTURES LEFT MIDDLE RING AND SMALL FINGERS (Left)  SURGEON:  Surgeon(s) and Role:    * Betha LoaKevin Travoris Bushey, MD - Primary    * Cindee SaltGary Lenox Bink, MD - Assisting  PHYSICIAN ASSISTANT:   ASSISTANTS: Cindee SaltGary Kerigan Narvaez, MD   ANESTHESIA:   regional and general  EBL:  Total I/O In: 1000 [I.V.:1000] Out: -   BLOOD ADMINISTERED:none  DRAINS: none   LOCAL MEDICATIONS USED:  NONE  SPECIMEN:  No Specimen  DISPOSITION OF SPECIMEN:  N/A  COUNTS:  YES  TOURNIQUET:   Total Tourniquet Time Documented: Upper Arm (Left) - 35 minutes Total: Upper Arm (Left) - 35 minutes   DICTATION: .Other Dictation: Dictation Number [id number]  PLAN OF CARE: Discharge to home after PACU  PATIENT DISPOSITION:  PACU - hemodynamically stable.

## 2014-07-28 NOTE — Anesthesia Preprocedure Evaluation (Signed)
Anesthesia Evaluation  Patient identified by MRN, date of birth, ID band Patient awake    Reviewed: Allergy & Precautions, NPO status , Patient's Chart, lab work & pertinent test results  Airway Mallampati: I  TM Distance: >3 FB Neck ROM: Full    Dental   Pulmonary          Cardiovascular     Neuro/Psych    GI/Hepatic   Endo/Other    Renal/GU      Musculoskeletal   Abdominal   Peds  Hematology   Anesthesia Other Findings   Reproductive/Obstetrics                             Anesthesia Physical Anesthesia Plan  ASA: II  Anesthesia Plan: General   Post-op Pain Management:    Induction: Intravenous  Airway Management Planned: LMA  Additional Equipment:   Intra-op Plan:   Post-operative Plan: Extubation in OR  Informed Consent: I have reviewed the patients History and Physical, chart, labs and discussed the procedure including the risks, benefits and alternatives for the proposed anesthesia with the patient or authorized representative who has indicated his/her understanding and acceptance.     Plan Discussed with: CRNA and Surgeon  Anesthesia Plan Comments:         Anesthesia Quick Evaluation  

## 2014-07-28 NOTE — Anesthesia Procedure Notes (Signed)
Procedure Name: MAC Date/Time: 07/28/2014 2:30 PM Performed by: Zenia ResidesPAYNE, Jeremi Losito D Pre-anesthesia Checklist: Patient identified, Emergency Drugs available, Suction available, Patient being monitored and Timeout performed Oxygen Delivery Method: Simple face mask

## 2014-07-28 NOTE — Op Note (Signed)
Intra-operative fluoroscopic images in the AP, lateral, and oblique views were taken and evaluated by myself.  Reduction and hardware placement were confirmed.  There was no intraarticular penetration of permanent hardware.  

## 2014-07-28 NOTE — Discharge Instructions (Addendum)
[facility] Instructions °Hand Surgery ° °Wound Care: °Keep your hand elevated above the level of your heart.  Do not allow it to dangle by your side.  Keep the dressing dry and do not remove it unless your doctor advises you to do so.  He will usually change it at the time of your post-op visit.  Moving your fingers is advised to stimulate circulation but will depend on the site of your surgery.  If you have a splint applied, your doctor will advise you regarding movement. ° °Activity: °Do not drive or operate machinery today.  Rest today and then you may return to your normal activity and work as indicated by your physician. ° °Diet:  °Drink liquids today or eat a light diet.  You may resume a regular diet tomorrow.   ° °General expectations: °Pain for two to three days. °Fingers may become slightly swollen. ° °Call your doctor if any of the following occur: °Severe pain not relieved by pain medication. °Elevated temperature. °Dressing soaked with blood. °Inability to move fingers. °White or bluish color to fingers. ° ° °Regional Anesthesia Blocks ° °1. Numbness or the inability to move the "blocked" extremity may last from 3-48 hours after placement. The length of time depends on the medication injected and your individual response to the medication. If the numbness is not going away after 48 hours, call your surgeon. ° °2. The extremity that is blocked will need to be protected until the numbness is gone and the  Strength has returned. Because you cannot feel it, you will need to take extra care to avoid injury. Because it may be weak, you may have difficulty moving it or using it. You may not know what position it is in without looking at it while the block is in effect. ° °3. For blocks in the legs and feet, returning to weight bearing and walking needs to be done carefully. You will need to wait until the numbness is entirely gone and the strength has returned. You should be able to move your leg and foot  normally before you try and bear weight or walk. You will need someone to be with you when you first try to ensure you do not fall and possibly risk injury. ° °4. Bruising and tenderness at the needle site are common side effects and will resolve in a few days. ° °5. Persistent numbness or new problems with movement should be communicated to the surgeon or the [facility] ([phone])/ [facility] ([phone]). ° ° °Post Anesthesia Home Care Instructions ° °Activity: °Get plenty of rest for the remainder of the day. A responsible adult should stay with you for 24 hours following the procedure.  °For the next 24 hours, DO NOT: °-Drive a car °-Operate machinery °-Drink alcoholic beverages °-Take any medication unless instructed by your physician °-Make any legal decisions or sign important papers. ° °Meals: °Start with liquid foods such as gelatin or soup. Progress to regular foods as tolerated. Avoid greasy, spicy, heavy foods. If nausea and/or vomiting occur, drink only clear liquids until the nausea and/or vomiting subsides. Call your physician if vomiting continues. ° °Special Instructions/Symptoms: °Your throat may feel dry or sore from the anesthesia or the breathing tube placed in your throat during surgery. If this causes discomfort, gargle with warm salt water. The discomfort should disappear within 24 hours. ° °
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
## 2014-07-28 NOTE — Anesthesia Postprocedure Evaluation (Signed)
Anesthesia Post Note  Patient: Crystal Morris  Procedure(s) Performed: Procedure(s) (LRB): CLOSED REDUCTION METACARPAL WITH PERCUTANEOUS PINNING PROXIMAL PHALANX FRACTURES LEFT MIDDLE RING AND SMALL FINGERS (Left)  Anesthesia type: regional  Patient location: PACU  Post pain: Pain level controlled  Post assessment: Patient's Cardiovascular Status Stable  Last Vitals:  Filed Vitals:   07/28/14 1538  BP:   Pulse: 77  Temp:   Resp: 17    Post vital signs: Reviewed and stable  Level of consciousness: awake and pain free  Complications: No apparent anesthesia complications

## 2014-07-28 NOTE — Progress Notes (Signed)
Assisted Dr. Ossey with left, ultrasound guided, supraclavicular block. Side rails up, monitors on throughout procedure. See vital signs in flow sheet. Tolerated Procedure well. 

## 2014-07-28 NOTE — Transfer of Care (Signed)
Immediate Anesthesia Transfer of Care Note  Patient: Crystal Morris  Procedure(s) Performed: Procedure(s): CLOSED REDUCTION METACARPAL WITH PERCUTANEOUS PINNING PROXIMAL PHALANX FRACTURES LEFT MIDDLE RING AND SMALL FINGERS (Left)  Patient Location: PACU  Anesthesia Type:MAC and Regional  Level of Consciousness: awake, alert  and oriented  Airway & Oxygen Therapy: Patient Spontanous Breathing and Patient connected to face mask oxygen  Post-op Assessment: Report given to RN and Post -op Vital signs reviewed and stable  Post vital signs: Reviewed and stable  Last Vitals:  Filed Vitals:   07/28/14 1538  BP:   Pulse: 77  Temp:   Resp: 17    Complications: No apparent anesthesia complications
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
## 2014-07-30 ENCOUNTER — Encounter (HOSPITAL_BASED_OUTPATIENT_CLINIC_OR_DEPARTMENT_OTHER): Payer: Self-pay | Admitting: Orthopedic Surgery

## 2014-08-03 LAB — POCT HEMOGLOBIN-HEMACUE: Hemoglobin: 8.8 g/dL — ABNORMAL LOW (ref 12.0–15.0)

## 2014-08-09 ENCOUNTER — Emergency Department (HOSPITAL_COMMUNITY)
Admission: EM | Admit: 2014-08-09 | Discharge: 2014-08-09 | Disposition: A | Discharge status: Home / Self Care | Payer: BC Managed Care – PPO | Attending: Emergency Medicine | Admitting: Emergency Medicine

## 2014-08-09 ENCOUNTER — Emergency Department (HOSPITAL_COMMUNITY): Payer: BC Managed Care – PPO

## 2014-08-09 ENCOUNTER — Encounter (HOSPITAL_COMMUNITY): Payer: Self-pay | Admitting: *Deleted

## 2014-08-09 DIAGNOSIS — M79642 Pain in left hand: Secondary | ICD-10-CM | POA: Insufficient documentation

## 2014-08-09 DIAGNOSIS — Z8781 Personal history of (healed) traumatic fracture: Secondary | ICD-10-CM | POA: Insufficient documentation

## 2014-08-09 DIAGNOSIS — G8918 Other acute postprocedural pain: Secondary | ICD-10-CM
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
## 2014-08-09 NOTE — Discharge Instructions (Signed)
Continue your to pin care as discussed by your surgeon.

## 2014-08-09 NOTE — ED Provider Notes (Signed)
CSN: [id number]     Arrival date & time 08/09/14  1237 History   First MD Initiated Contact with Patient 08/09/14 1255     Chief Complaint  Patient presents with  . post op problem, hand fracture      HPI  Patient is reevaluation of her left hand. Recent hand fracture with fractures of the P1 of the third, fourth, and fifth rays. Underwent K wire stabilization with Dr. Merlyn LotKuzma. The pins were protruding through her skin. She was changing her dressing for 5 days ago when the radial most been making partially dislodged. She saw Dr. Merlyn LotKuzma. The pin was cut and rebent, and slightly shortened. She states that when she took her splint off again to clean it she noticed that the pin tip  is now not visible through the skin and she was concerned. States it might be "a little more swollen" no drainage. No redness. Minimal pain. Still taking OxyContin at home.  Past Medical History  Diagnosis Date  . Medical history non-contributory   . Wears glasses    Past Surgical History  Procedure Laterality Date  . Hemorroidectomy    . Intrauterine device insertion    . Closed reduction metacarpal with percutaneous pinning Left 07/28/2014    Procedure: CLOSED REDUCTION METACARPAL WITH PERCUTANEOUS PINNING PROXIMAL PHALANX FRACTURES LEFT MIDDLE RING AND SMALL FINGERS;  Surgeon: Betha LoaKevin Kuzma, MD;  Location: [facility];  Service: Orthopedics;  Laterality: Left;   History reviewed. No pertinent family history. History  Substance Use Topics  . Smoking status: Never Smoker   . Smokeless tobacco: Not on file  . Alcohol Use: No   OB History    No data available     Review of Systems  Musculoskeletal:       Pain left hand. Decreasing each day. No redness or drainage.      Allergies  Oxycodone  Home Medications   Prior to Admission medications   Medication Sig Start Date End Date Taking? Authorizing Provider  aspirin-acetaminophen-caffeine (EXCEDRIN MIGRAINE) [phone] MG per tablet Take 1  tablet by mouth every 6 (six) hours as needed for headache.   Yes Historical Provider, MD  diphenhydrAMINE (BENADRYL) 25 mg capsule Take 25 mg by mouth at bedtime as needed for itching, allergies or sleep.   Yes Historical Provider, MD  HYDROcodone-acetaminophen (NORCO) 5-325 MG per tablet Take 1-2 tablets by mouth every 6 (six) hours as needed for moderate pain. 07/26/14  Yes Arthor CaptainAbigail Harris, PA-C  HYDROcodone-acetaminophen (NORCO) 5-325 MG per tablet 1-2 tabs po q6 hours prn pain Patient not taking: Reported on 08/09/2014 07/28/14   Betha LoaKevin Kuzma, MD   BP 118/92 mmHg  Pulse 81  Temp(Src) 98.3 F (36.8 C) (Oral)  Resp 16  SpO2 100% Physical Exam  Musculoskeletal:       Hands:   ED Course  Procedures (including critical care time) Labs Review Labs Reviewed - No data to display  Imaging Review No results found.   EKG Interpretation None      MDM   Final diagnoses:  Post-op pain    X-rays obtained. Her K wires also be crossing and peripherally stabilizing her fractures. I discussed with her that her head shows no sign of infection. Astra follow-up with her surgeon. Do not feel that the pin and that has this. The skin is not appear to be causing compromise or skin or obvious infection. That her follow with her surgeon regarding this.    Rolland PorterMark Elyna Pangilinan, MD 08/09/14 [id number]

## 2014-08-11 ENCOUNTER — Emergency Department (HOSPITAL_COMMUNITY): Payer: BC Managed Care – PPO

## 2014-08-11 ENCOUNTER — Emergency Department (HOSPITAL_COMMUNITY)
Admission: EM | Admit: 2014-08-11 | Discharge: 2014-08-11 | Disposition: A | Discharge status: Home / Self Care | Payer: BC Managed Care – PPO | Attending: Emergency Medicine | Admitting: Emergency Medicine

## 2014-08-11 ENCOUNTER — Encounter (HOSPITAL_COMMUNITY): Payer: Self-pay | Admitting: *Deleted

## 2014-08-11 DIAGNOSIS — R0602 Shortness of breath: Secondary | ICD-10-CM | POA: Insufficient documentation

## 2014-08-11 DIAGNOSIS — Z862 Personal history of diseases of the blood and blood-forming organs and certain disorders involving the immune mechanism: Secondary | ICD-10-CM

## 2014-08-11 DIAGNOSIS — R531 Weakness: Secondary | ICD-10-CM | POA: Diagnosis not present

## 2014-08-11 DIAGNOSIS — D649 Anemia, unspecified: Secondary | ICD-10-CM | POA: Diagnosis present

## 2014-08-11 DIAGNOSIS — R630 Anorexia: Secondary | ICD-10-CM | POA: Insufficient documentation

## 2014-08-11 DIAGNOSIS — Z79899 Other long term (current) drug therapy: Secondary | ICD-10-CM | POA: Diagnosis not present

## 2014-08-11 DIAGNOSIS — R42 Dizziness and giddiness: Secondary | ICD-10-CM | POA: Diagnosis not present

## 2014-08-11 DIAGNOSIS — R5383 Other fatigue: Secondary | ICD-10-CM | POA: Diagnosis not present

## 2014-08-11 LAB — BASIC METABOLIC PANEL
ANION GAP: 8 (ref 5–15)
BUN: 19 mg/dL (ref 6–23)
CHLORIDE: 102 mmol/L (ref 96–112)
CO2: 25 mmol/L (ref 19–32)
CREATININE: 1.26 mg/dL — AB (ref 0.50–1.10)
Calcium: 8.9 mg/dL (ref 8.4–10.5)
GFR calc non Af Amer: 49 mL/min — ABNORMAL LOW (ref >90)
GFR, EST AFRICAN AMERICAN: 57 mL/min — AB (ref >90)
Glucose, Bld: 89 mg/dL (ref 70–99)
Potassium: 3.6 mmol/L (ref 3.5–5.1)
Sodium: 135 mmol/L (ref 135–145)

## 2014-08-11 LAB — CBC
HCT: 33 % — ABNORMAL LOW (ref 36.0–46.0)
Hemoglobin: 10 g/dL — ABNORMAL LOW (ref 12.0–15.0)
MCH: 24.6 pg — ABNORMAL LOW (ref 26.0–34.0)
MCHC: 30.3 g/dL (ref 30.0–36.0)
MCV: 81.1 fL (ref 78.0–100.0)
PLATELETS: 489 10*3/uL — AB (ref 150–400)
RBC: 4.07 MIL/uL (ref 3.87–5.11)
RDW: 17.4 % — ABNORMAL HIGH (ref 11.5–15.5)
WBC: 4.1 10*3/uL (ref 4.0–10.5)

## 2014-08-11 LAB — BRAIN NATRIURETIC PEPTIDE: B Natriuretic Peptide: 10.4 pg/mL (ref 0.0–100.0)

## 2014-08-11 LAB — I-STAT TROPONIN, ED: Troponin i, poc: 0 ng/mL (ref 0.00–0.08)

## 2014-08-11 NOTE — ED Notes (Signed)
Patient transported to X-ray 

## 2014-08-11 NOTE — ED Provider Notes (Signed)
CSN: [id number]     Arrival date & time 08/11/14  1713 History   First MD Initiated Contact with Patient 08/11/14 1856     Chief Complaint  Patient presents with  . Anemia     (Consider location/radiation/quality/duration/timing/severity/associated sxs/prior Treatment) HPI Pt is a 49yo female with hx of anemia, reports having a Hgb of 8.8 two weeks ago and Hgb of 9 two days ago per pt, presenting to ED with c/o gradually worsening generalized weakness and DOE.  States she cannot clean her house w/o becoming fatigued.  Pt states she believes her Hgb is down again and she needs a transfusion as she is eating a lot of ice and feels more fatigued.  Pt denies chest pain. Denies SOB at this time. Denies abdominal pain, n/v/d. Denies headache.  Pt states her PCP has already r/o GI bleed. Denies blood in stool or urine. Denies hx of blood clots. Denies leg pain or swelling. Pt does have splint on left hand, currently being followed by Dr. Merlyn Lot for recent hand surgery.    Past Medical History  Diagnosis Date  . Medical history non-contributory   . Wears glasses    Past Surgical History  Procedure Laterality Date  . Hemorroidectomy    . Intrauterine device insertion    . Closed reduction metacarpal with percutaneous pinning Left 07/28/2014    Procedure: CLOSED REDUCTION METACARPAL WITH PERCUTANEOUS PINNING PROXIMAL PHALANX FRACTURES LEFT MIDDLE RING AND SMALL FINGERS;  Surgeon: Betha Loa, MD;  Location: [facility];  Service: Orthopedics;  Laterality: Left;   No family history on file. History  Substance Use Topics  . Smoking status: Never Smoker   . Smokeless tobacco: Not on file  . Alcohol Use: No   OB History    No data available     Review of Systems  Constitutional: Positive for fatigue and unexpected weight change (10 lbs in 3 weeks). Negative for chills.  Respiratory: Positive for shortness of breath. Negative for cough.   Cardiovascular: Negative for chest pain and  palpitations.  Gastrointestinal: Negative for nausea, vomiting, abdominal pain and diarrhea.  Musculoskeletal: Negative for myalgias and back pain.  Neurological: Positive for weakness (generalized) and light-headedness. Negative for dizziness, tremors, syncope, numbness and headaches.  All other systems reviewed and are negative.     Allergies  Oxycodone  Home Medications   Prior to Admission medications   Medication Sig Start Date End Date Taking? Authorizing Provider  aspirin-acetaminophen-caffeine (EXCEDRIN MIGRAINE) [phone] MG per tablet Take 1 tablet by mouth every 6 (six) hours as needed for headache.   Yes Historical Provider, MD  diphenhydrAMINE (BENADRYL) 25 mg capsule Take 25 mg by mouth at bedtime as needed for itching, allergies or sleep.   Yes Historical Provider, MD  escitalopram (LEXAPRO) 20 MG tablet Take 1 tablet by mouth daily. 06/11/14  Yes Historical Provider, MD  HYDROcodone-acetaminophen (NORCO) 5-325 MG per tablet Take 1-2 tablets by mouth every 6 (six) hours as needed for moderate pain. 07/26/14  Yes Arthor Captain, PA-C  Polyethyl Glycol-Propyl Glycol (SYSTANE OP) Place 1 drop into both eyes at bedtime.   Yes Historical Provider, MD  PREMPRO 0.3-1.5 MG per tablet Take 1 tablet by mouth daily. 06/11/14  Yes Historical Provider, MD  HYDROcodone-acetaminophen (NORCO) 5-325 MG per tablet 1-2 tabs po q6 hours prn pain Patient not taking: Reported on 08/09/2014 07/28/14   Betha Loa, MD   BP 147/86 mmHg  Pulse 92  Temp(Src) 98.1 F (36.7 C) (Oral)  Resp  18  SpO2 100% Physical Exam  Constitutional: She is oriented to person, place, and time. She appears well-developed and well-nourished. No distress.  HENT:  Head: Normocephalic and atraumatic.  Eyes: Conjunctivae and EOM are normal. Pupils are equal, round, and reactive to light. No scleral icterus.  Neck: Normal range of motion. Neck supple.  Cardiovascular: Normal rate, regular rhythm and normal heart sounds.    Regular rate and rhythm  Pulmonary/Chest: Effort normal and breath sounds normal. No respiratory distress. She has no wheezes. She has no rales. She exhibits no tenderness.  No respiratory distress, no accessory muscle use. Able to speak in full sentences w/o difficulty. Lungs: CTAB  Abdominal: Soft. Bowel sounds are normal. She exhibits no distension and no mass. There is no tenderness. There is no rebound and no guarding.  Soft, non-distended, non-tender.  Musculoskeletal:  Left hand in splint (s/p hand surgery) Bilateral lower extremities: no edema, no erythema, non-tender  Neurological: She is alert and oriented to person, place, and time.  Skin: Skin is warm and dry. She is not diaphoretic.  Nursing note and vitals reviewed.   ED Course  Procedures (including critical care time) Labs Review Labs Reviewed  CBC - Abnormal; Notable for the following:    Hemoglobin 10.0 (*)    HCT 33.0 (*)    MCH 24.6 (*)    RDW 17.4 (*)    Platelets 489 (*)    All other components within normal limits  BASIC METABOLIC PANEL - Abnormal; Notable for the following:    Creatinine, Ser 1.26 (*)    GFR calc non Af Amer 49 (*)    GFR calc Af Amer 57 (*)    All other components within normal limits  BRAIN NATRIURETIC PEPTIDE  I-STAT TROPOININ, ED    Imaging Review Dg Chest 2 View  08/11/2014   CLINICAL DATA:  Anemia. Sent here for blood transfusion. Fatigue, shortness of breath, and decreased appetite.  EXAM: CHEST  2 VIEW  COMPARISON:  CT chest 07/21/2014  FINDINGS: The heart size and mediastinal contours are within normal limits. Both lungs are clear. The visualized skeletal structures are unremarkable.  IMPRESSION: No active cardiopulmonary disease.   Electronically Signed   By: Burman Nieves M.D.   On: 08/11/2014 20:23     EKG Interpretation   Date/Time:  Tuesday August 11 2014 19:49:35 EST Ventricular Rate:  87 PR Interval:  165 QRS Duration: 74 QT Interval:  368 QTC Calculation:  443 R Axis:   70 Text Interpretation:  Sinus rhythm No prior for comparison Confirmed by  Gwendolyn Grant  MD, BLAIR ([id number]) on 08/11/2014 8:00:24 PM      MDM   Final diagnoses:  Generalized weakness  Other fatigue  History of anemia    Pt is a 49yo female with hx of anemia, reports Hgb of 8.8 last week and Hgb of 9 two days ago.  Today, Hgb is 10.0 but pt came to ED as she reports increased craving for ice chips, weight loss of 10 pounds in 3 weeks, generalized weakness and worsening fatigue. Pt concerned she needs a blood transfusion. Denies CP or SOB in ED but states she becomes SOB on exertion. Denies previous CAD.  Denies n/v/d.  Pt states her PCP already ruled out a GI bleed. Pt denies blood in stool or urine.   Discussed pt with Dr. Gwendolyn Grant, due to Hgb being only slightly below normal levels at 10.0 today, additional SOB workup should be performed including CXR and BNP, will  also add troponin.  Labs: unremarkable. CXR: no active cardiopulmonary disease. Upon ambulation with pulse-ox, pt found to have an O2 Sat of 92% on RA, down from 100% on RA at rest. Discussed this with Dr. Gwendolyn GrantWalden who states he doubts PE, no additional workup for SOB indicated in ED setting.  Filed Vitals:   08/11/14 2006  BP: 147/86  Pulse: 92  Temp: 98.1 F (36.7 C)  Resp: 18  O2 Sat- 100% on RA.  Discussed pt with Dr. Gwendolyn GrantWalden after he examined pt. Agrees she may be discharged home as labs are unremarkable, Hgb of 10.0 does not warrant a blood transfusion.  EKG: unremarkable.  Advised to f/u with her PCP for recheck of symptoms in 2-3 days. Return precautions provided. Pt verbalized understanding and agreement with tx plan.     Junius Finnerrin O'Malley, PA-C 08/11/14 2135  Elwin MochaBlair Walden, MD 08/12/14 [phone]

## 2014-08-11 NOTE — ED Notes (Signed)
Pt O2 sat dipped to 92% during ambulation.

## 2014-08-11 NOTE — ED Notes (Signed)
Pt reports anemia and that her Dr sent her for blood transfusion because she is symptomatic, fatigue, sob, decreased appetite. 2 wks ago hemoglobin 8.8, 2 days ago 9 per pt report. Pt sts they have ruled out GI bleeding.

## 2014-08-12 ENCOUNTER — Telehealth: Payer: Self-pay | Admitting: Oncology

## 2014-08-12 NOTE — Telephone Encounter (Signed)
pt confirmed appt on 09/09/14 at 10:30 Dx: chronic and worsening anemia Referring Dr. Clelia CroftShaw

## 2014-08-13 ENCOUNTER — Telehealth: Payer: Self-pay | Admitting: Oncology

## 2014-08-13 NOTE — Telephone Encounter (Signed)
CHART DELIVERED ON 08/13/14.  TG °

## 2014-09-09 ENCOUNTER — Telehealth: Payer: Self-pay | Admitting: Oncology

## 2014-09-09 ENCOUNTER — Ambulatory Visit: Payer: BC Managed Care – PPO

## 2014-09-09 ENCOUNTER — Other Ambulatory Visit: Payer: BC Managed Care – PPO

## 2014-09-09 ENCOUNTER — Encounter: Payer: Self-pay | Admitting: Oncology

## 2014-09-09 ENCOUNTER — Ambulatory Visit (HOSPITAL_BASED_OUTPATIENT_CLINIC_OR_DEPARTMENT_OTHER): Payer: BC Managed Care – PPO | Admitting: Oncology

## 2014-09-09 VITALS — BP 129/86 | HR 103 | Temp 98.5°F | Resp 20 | Ht 62.0 in | Wt 129.5 lb

## 2014-09-09 DIAGNOSIS — D509 Iron deficiency anemia, unspecified: Secondary | ICD-10-CM | POA: Diagnosis not present

## 2014-09-09 NOTE — Progress Notes (Signed)
Please see consult note.  
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
## 2014-09-09 NOTE — Telephone Encounter (Signed)
gave and printed appt sched and avs for pt for April and July....sed added tx.

## 2014-09-09 NOTE — Progress Notes (Signed)
Checked in new pt with no financial concerns at this time. ° °

## 2014-09-10 ENCOUNTER — Ambulatory Visit
Admission: RE | Admit: 2014-09-10 | Discharge: 2014-09-10 | Disposition: A | Discharge status: Home / Self Care | Payer: BC Managed Care – PPO | Source: Ambulatory Visit | Attending: Family Medicine | Admitting: Family Medicine

## 2014-09-10 ENCOUNTER — Other Ambulatory Visit: Payer: Self-pay | Admitting: Family Medicine

## 2014-09-10 DIAGNOSIS — R52 Pain, unspecified: Secondary | ICD-10-CM

## 2014-09-11 ENCOUNTER — Other Ambulatory Visit: Payer: Self-pay | Admitting: *Deleted

## 2014-09-11 ENCOUNTER — Ambulatory Visit (HOSPITAL_BASED_OUTPATIENT_CLINIC_OR_DEPARTMENT_OTHER): Payer: BC Managed Care – PPO

## 2014-09-11 DIAGNOSIS — D509 Iron deficiency anemia, unspecified: Secondary | ICD-10-CM | POA: Diagnosis not present

## 2014-09-11 MED ORDER — SODIUM CHLORIDE 0.9 % IV SOLN
Freq: Once | INTRAVENOUS | Status: AC
  Administered 2014-09-11: 09:00:00 via INTRAVENOUS

## 2014-09-11 MED ORDER — ONDANSETRON HCL 8 MG PO TABS
8.0000 mg | ORAL_TABLET | Freq: Once | ORAL | Status: AC
  Administered 2014-09-11: 8 mg via ORAL

## 2014-09-11 MED ORDER — SODIUM CHLORIDE 0.9 % IV SOLN
510.0000 mg | Freq: Once | INTRAVENOUS | Status: AC
  Administered 2014-09-11: 510 mg via INTRAVENOUS
  Filled 2014-09-11: qty 17

## 2014-09-11 MED ORDER — ONDANSETRON HCL 8 MG PO TABS
ORAL_TABLET | ORAL | Status: AC
  Filled 2014-09-11: qty 1

## 2014-09-11 NOTE — Patient Instructions (Signed)

## 2014-09-16 ENCOUNTER — Telehealth: Payer: Self-pay | Admitting: *Deleted

## 2014-09-16 NOTE — Telephone Encounter (Signed)
Patient called and moved appt from this week to next week

## 2014-09-18 ENCOUNTER — Ambulatory Visit: Payer: BC Managed Care – PPO

## 2014-09-23 ENCOUNTER — Ambulatory Visit (HOSPITAL_BASED_OUTPATIENT_CLINIC_OR_DEPARTMENT_OTHER): Payer: BC Managed Care – PPO

## 2014-09-23 VITALS — BP 116/81 | HR 92 | Temp 97.7°F | Resp 20

## 2014-09-23 DIAGNOSIS — D509 Iron deficiency anemia, unspecified: Secondary | ICD-10-CM | POA: Diagnosis not present

## 2014-09-23 MED ORDER — SODIUM CHLORIDE 0.9 % IV SOLN
Freq: Once | INTRAVENOUS | Status: AC
  Administered 2014-09-23: 14:00:00 via INTRAVENOUS

## 2014-09-23 MED ORDER — SODIUM CHLORIDE 0.9 % IV SOLN
510.0000 mg | Freq: Once | INTRAVENOUS | Status: AC
  Administered 2014-09-23: 510 mg via INTRAVENOUS
  Filled 2014-09-23: qty 17

## 2014-09-23 NOTE — Patient Instructions (Signed)

## 2014-10-28 ENCOUNTER — Telehealth: Payer: Self-pay | Admitting: Oncology

## 2014-10-28 NOTE — Telephone Encounter (Signed)
Called and left a message with [facility]

## 2014-12-16 ENCOUNTER — Other Ambulatory Visit: Payer: BC Managed Care – PPO

## 2014-12-16 ENCOUNTER — Ambulatory Visit: Payer: BC Managed Care – PPO | Admitting: Oncology

## 2014-12-17 ENCOUNTER — Other Ambulatory Visit: Payer: BC Managed Care – PPO

## 2014-12-17 ENCOUNTER — Ambulatory Visit: Payer: BC Managed Care – PPO | Admitting: Oncology

## 2015-01-01 ENCOUNTER — Telehealth: Payer: Self-pay | Admitting: *Deleted

## 2015-01-01 ENCOUNTER — Ambulatory Visit (HOSPITAL_BASED_OUTPATIENT_CLINIC_OR_DEPARTMENT_OTHER): Payer: 59 | Admitting: Oncology

## 2015-01-01 ENCOUNTER — Telehealth: Payer: Self-pay | Admitting: Oncology

## 2015-01-01 ENCOUNTER — Encounter: Payer: Self-pay | Admitting: *Deleted

## 2015-01-01 ENCOUNTER — Other Ambulatory Visit (HOSPITAL_BASED_OUTPATIENT_CLINIC_OR_DEPARTMENT_OTHER): Payer: 59

## 2015-01-01 VITALS — BP 131/91 | HR 92 | Temp 98.4°F | Resp 17 | Ht 62.0 in | Wt 133.2 lb

## 2015-01-01 DIAGNOSIS — D509 Iron deficiency anemia, unspecified: Secondary | ICD-10-CM

## 2015-01-01 LAB — IRON AND TIBC CHCC
%SAT: 33 % (ref 21–57)
Iron: 117 ug/dL (ref 41–142)
TIBC: 359 ug/dL (ref 236–444)
UIBC: 242 ug/dL (ref 120–384)

## 2015-01-01 LAB — CBC WITH DIFFERENTIAL/PLATELET
BASO%: 0.3 % (ref 0.0–2.0)
BASOS ABS: 0 10*3/uL (ref 0.0–0.1)
EOS%: 0.2 % (ref 0.0–7.0)
Eosinophils Absolute: 0 10*3/uL (ref 0.0–0.5)
HEMATOCRIT: 33.7 % — AB (ref 34.8–46.6)
HGB: 10.7 g/dL — ABNORMAL LOW (ref 11.6–15.9)
LYMPH%: 38.1 % (ref 14.0–49.7)
MCH: 26.5 pg (ref 25.1–34.0)
MCHC: 31.8 g/dL (ref 31.5–36.0)
MCV: 83.2 fL (ref 79.5–101.0)
MONO#: 0.3 10*3/uL (ref 0.1–0.9)
MONO%: 8 % (ref 0.0–14.0)
NEUT#: 1.8 10*3/uL (ref 1.5–6.5)
NEUT%: 53.4 % (ref 38.4–76.8)
PLATELETS: 306 10*3/uL (ref 145–400)
RBC: 4.05 10*6/uL (ref 3.70–5.45)
RDW: 13.1 % (ref 11.2–14.5)
WBC: 3.4 10*3/uL — ABNORMAL LOW (ref 3.9–10.3)
lymph#: 1.3 10*3/uL (ref 0.9–3.3)

## 2015-01-01 LAB — COMPREHENSIVE METABOLIC PANEL (CC13)
ALT: 17 U/L (ref 0–55)
AST: 18 U/L (ref 5–34)
Albumin: 4 g/dL (ref 3.5–5.0)
Alkaline Phosphatase: 35 U/L — ABNORMAL LOW (ref 40–150)
Anion Gap: 8 mEq/L (ref 3–11)
BUN: 15.1 mg/dL (ref 7.0–26.0)
CALCIUM: 9.3 mg/dL (ref 8.4–10.4)
CHLORIDE: 103 meq/L (ref 98–109)
CO2: 27 mEq/L (ref 22–29)
Creatinine: 1.1 mg/dL (ref 0.6–1.1)
EGFR: 66 mL/min/{1.73_m2} — AB (ref >90)
GLUCOSE: 68 mg/dL — AB (ref 70–140)
Potassium: 3.8 mEq/L (ref 3.5–5.1)
SODIUM: 139 meq/L (ref 136–145)
TOTAL PROTEIN: 7.5 g/dL (ref 6.4–8.3)
Total Bilirubin: 0.2 mg/dL (ref 0.20–1.20)

## 2015-01-01 LAB — FERRITIN CHCC: Ferritin: 284 ng/ml — ABNORMAL HIGH (ref 9–269)

## 2015-01-01 NOTE — Telephone Encounter (Signed)
Per staff message and POF I have scheduled appts. Advised scheduler of appts. JMW  

## 2015-01-01 NOTE — Telephone Encounter (Signed)
per pof to sch pt appt-sent Dr Feng per Michelle to adv pt need to be in Infusion by 11-awaiting reply by Dr Feng so I can call pt to advise of appt on 8/1- °

## 2015-01-01 NOTE — Progress Notes (Signed)
Hematology and Oncology Follow Up Visit  Crystal Morris [id number] 1965-06-21 49 y.o. 01/01/2015 10:15 AM  Principle Diagnosis: 49 year old woman with Iron deficiency diagnosed in March 2016. She presented there with a hemoglobin of 8.8, MCV of 81, RDW of 17.4, platelet count of 489 and ferritin of 18.   Prior Therapy: Feraheme at total of 1 g completed on 09/23/2014.  Current therapy: Observation and surveillance.  Interim History: Crystal Morris presents today for a follow-up visit. Since the last visit, she received IV iron without any complications. She reports her symptoms have improved including fatigue and tiredness but not completely resolved. She does not report any hematochezia or melanoma. Does not report any hemoptysis or hematemesis. Does not report any GI symptoms. She has not reported any constitutional symptoms of weight loss or appetite changes and she has gained weight.  She does not report any headaches, blurry vision, syncope or seizures. She does not report any fevers, chills, sweats. She does not report any chest pain, palpitation orthopnea. Does not report any cough, wheezing or hemoptysis. She does not report any nausea, vomiting, abdominal pain, or early satiety. She does not report any frequency urgency or hesitancy. She does not report any skeletal complaints. Remaining review of systems unremarkable.  Medications: I have reviewed the patient's current medications.  Current Outpatient Prescriptions  Medication Sig Dispense Refill  . aspirin-acetaminophen-caffeine (EXCEDRIN MIGRAINE) 250-250-65 MG per tablet Take 1 tablet by mouth every 6 (six) hours as needed for headache.    . diphenhydrAMINE (BENADRYL) 25 mg capsule Take 25 mg by mouth at bedtime as needed for itching, allergies or sleep.    Marland Kitchen escitalopram (LEXAPRO) 20 MG tablet Take 1 tablet by mouth daily.    Marland Kitchen HYDROcodone-acetaminophen (NORCO) 5-325 MG per tablet Take 1-2 tablets by mouth every 6 (six) hours as needed  for moderate pain. 20 tablet 0  . Polyethyl Glycol-Propyl Glycol (SYSTANE OP) Place 1 drop into both eyes at bedtime.    Marland Kitchen PREMPRO 0.3-1.5 MG per tablet Take 1 tablet by mouth daily.     No current facility-administered medications for this visit.     Allergies:  Allergies  Allergen Reactions  . Oxycodone Itching    Past Medical History, Surgical history, Social history, and Family History were reviewed and updated.   Physical Exam: Blood pressure 131/91, pulse 92, temperature 98.4 F (36.9 C), temperature source Oral, resp. rate 17, height  (1.575 m), weight 133 lb 3.2 oz (60.419 kg), SpO2 100 %. ECOG: 0 General appearance: alert and cooperative Head: Normocephalic, without obvious abnormality Neck: no adenopathy Lymph nodes: Cervical, supraclavicular, and axillary nodes normal. Heart:regular rate and rhythm, S1, S2 normal, no murmur, click, rub or gallop Lung:chest clear, no wheezing, rales, normal symmetric air entry Abdomin: soft, non-tender, without masses or organomegaly EXT:no erythema, induration, or nodules   Lab Results: Lab Results  Component Value Date   WBC 3.4* 01/01/2015   HGB 10.7* 01/01/2015   HCT 33.7* 01/01/2015   MCV 83.2 01/01/2015   PLT 306 01/01/2015     Chemistry      Component Value Date/Time   NA 139 01/01/2015 0930   NA 135 08/11/2014 1802   K 3.8 01/01/2015 0930   K 3.6 08/11/2014 1802   CL 102 08/11/2014 1802   CO2 27 01/01/2015 0930   CO2 25 08/11/2014 1802   BUN 15.1 01/01/2015 0930   BUN 19 08/11/2014 1802   CREATININE 1.1 01/01/2015 0930   CREATININE 1.26* 08/11/2014 1802  Component Value Date/Time   CALCIUM 9.3 01/01/2015 0930   CALCIUM 8.9 08/11/2014 1802   ALKPHOS 35* 01/01/2015 0930   AST 18 01/01/2015 0930   ALT 17 01/01/2015 0930   BILITOT 0.20 01/01/2015 0930       Impression and Plan:   Assessment and Plan:   49 year old woman with the following issues:  1. Iron deficiency anemia manifesting  itself with a hemoglobin that have been fluctuating between 9.3 and 10.8 last 4 years. Her MCV is currently around 80.7 with iron level of 46, ferritin level of 18 and they're both low. Her iron binding capacity which is increased at 579 consistent with iron deficiency. She is intolerant to oral iron and was symptomatic.  She is status post Feraheme replacement with improvement in her hemoglobin. I'm rechecking her iron studies and repeating iron infusion if needed to.  2. Colon cancer screening: She will require colonoscopy given the fact that she is 49 years of age and has developed iron deficiency.    3. Follow-up: I will see her back in 3 months to recheck her iron stores and follow-up on her symptomatology.            [facility], MD 7/29/201610:15 AM

## 2015-01-01 NOTE — Telephone Encounter (Signed)
per reply from Michelle-cld pt & gave pt Fereheme appt times & dates

## 2015-01-07 ENCOUNTER — Ambulatory Visit: Payer: Self-pay

## 2015-01-12 ENCOUNTER — Encounter: Payer: Self-pay | Admitting: Oncology

## 2015-01-12 NOTE — Progress Notes (Signed)
I mailed amag application for patient for possible asst with feraheme.

## 2015-01-14 ENCOUNTER — Ambulatory Visit (HOSPITAL_BASED_OUTPATIENT_CLINIC_OR_DEPARTMENT_OTHER): Payer: 59

## 2015-01-14 VITALS — BP 128/88 | HR 95 | Temp 99.0°F | Resp 20

## 2015-01-14 DIAGNOSIS — D509 Iron deficiency anemia, unspecified: Secondary | ICD-10-CM | POA: Diagnosis not present

## 2015-01-14 MED ORDER — SODIUM CHLORIDE 0.9 % IV SOLN
Freq: Once | INTRAVENOUS | Status: AC
  Administered 2015-01-14: 15:00:00 via INTRAVENOUS

## 2015-01-14 MED ORDER — SODIUM CHLORIDE 0.9 % IV SOLN
510.0000 mg | Freq: Once | INTRAVENOUS | Status: AC
  Administered 2015-01-14: 510 mg via INTRAVENOUS
  Filled 2015-01-14: qty 17

## 2015-01-14 NOTE — Patient Instructions (Signed)

## 2015-03-03 LAB — HM MAMMOGRAPHY

## 2015-03-23 ENCOUNTER — Telehealth: Payer: Self-pay | Admitting: Oncology

## 2015-03-23 ENCOUNTER — Other Ambulatory Visit (HOSPITAL_BASED_OUTPATIENT_CLINIC_OR_DEPARTMENT_OTHER): Payer: 59

## 2015-03-23 ENCOUNTER — Ambulatory Visit (HOSPITAL_BASED_OUTPATIENT_CLINIC_OR_DEPARTMENT_OTHER): Payer: 59 | Admitting: Oncology

## 2015-03-23 VITALS — BP 129/82 | HR 92 | Temp 98.4°F | Resp 18 | Ht 62.0 in | Wt 137.4 lb

## 2015-03-23 DIAGNOSIS — D509 Iron deficiency anemia, unspecified: Secondary | ICD-10-CM

## 2015-03-23 LAB — COMPREHENSIVE METABOLIC PANEL (CC13)
ALK PHOS: 48 U/L (ref 40–150)
ALT: 31 U/L (ref 0–55)
AST: 26 U/L (ref 5–34)
Albumin: 3.9 g/dL (ref 3.5–5.0)
Anion Gap: 9 mEq/L (ref 3–11)
BUN: 19.1 mg/dL (ref 7.0–26.0)
CO2: 25 meq/L (ref 22–29)
CREATININE: 1 mg/dL (ref 0.6–1.1)
Calcium: 8.7 mg/dL (ref 8.4–10.4)
Chloride: 105 mEq/L (ref 98–109)
EGFR: 80 mL/min/{1.73_m2} — ABNORMAL LOW (ref >90)
GLUCOSE: 86 mg/dL (ref 70–140)
Potassium: 4.9 mEq/L (ref 3.5–5.1)
SODIUM: 138 meq/L (ref 136–145)
TOTAL PROTEIN: 7.6 g/dL (ref 6.4–8.3)

## 2015-03-23 LAB — CBC WITH DIFFERENTIAL/PLATELET
BASO%: 0.3 % (ref 0.0–2.0)
Basophils Absolute: 0 10*3/uL (ref 0.0–0.1)
EOS%: 0.8 % (ref 0.0–7.0)
Eosinophils Absolute: 0 10*3/uL (ref 0.0–0.5)
HEMATOCRIT: 37.4 % (ref 34.8–46.6)
HGB: 11.6 g/dL (ref 11.6–15.9)
LYMPH#: 1.5 10*3/uL (ref 0.9–3.3)
LYMPH%: 37.9 % (ref 14.0–49.7)
MCH: 26.7 pg (ref 25.1–34.0)
MCHC: 31 g/dL — ABNORMAL LOW (ref 31.5–36.0)
MCV: 86.2 fL (ref 79.5–101.0)
MONO#: 0.3 10*3/uL (ref 0.1–0.9)
MONO%: 7.5 % (ref 0.0–14.0)
NEUT#: 2.1 10*3/uL (ref 1.5–6.5)
NEUT%: 53.5 % (ref 38.4–76.8)
Platelets: 302 10*3/uL (ref 145–400)
RBC: 4.34 10*6/uL (ref 3.70–5.45)
RDW: 13 % (ref 11.2–14.5)
WBC: 4 10*3/uL (ref 3.9–10.3)

## 2015-03-23 LAB — IRON AND TIBC CHCC
%SAT: 40 % (ref 21–57)
IRON: 143 ug/dL — AB (ref 41–142)
TIBC: 360 ug/dL (ref 236–444)
UIBC: 217 ug/dL (ref 120–384)

## 2015-03-23 LAB — FERRITIN CHCC: Ferritin: 420 ng/ml — ABNORMAL HIGH (ref 9–269)

## 2015-03-23 NOTE — Progress Notes (Signed)
Hematology and Oncology Follow Up Visit  Ina HomesRoselyn Morris [id number] 08-Dec-1965 49 y.o. 03/23/2015 8:40 AM  Principle Diagnosis: 49 year old woman with Iron deficiency diagnosed in March 2016. She presented there with a hemoglobin of 8.8, MCV of 81, RDW of 17.4, platelet count of 489 and ferritin of 18.   Prior Therapy: Feraheme at total of 1 g completed on 09/23/2014. She received 510 mg on 01/14/2015.  Current therapy: Observation and surveillance.  Interim History: Ms. Crystal Morris presents today for a follow-up visit. Since the last visit, she reports doing relatively fair. She does report periodic fatigue and weight gain but still able to perform activities of daily living. She received IV iron on 01/14/2015 without any complications. She does not report any hematochezia or melanoma. Does not report any hemoptysis or hematemesis. Does not report any GI symptoms. She has not reported any constitutional symptoms of weight loss or appetite changes and she has gained weight.  She does not report any headaches, blurry vision, syncope or seizures. She does not report any fevers, chills, sweats. She does not report any chest pain, palpitation orthopnea. Does not report any cough, wheezing or hemoptysis. She does not report any nausea, vomiting, abdominal pain, or early satiety. She does not report any frequency urgency or hesitancy. She does not report any skeletal complaints. Remaining review of systems unremarkable.  Medications: I have reviewed the patient's current medications.  Current Outpatient Prescriptions  Medication Sig Dispense Refill  . aspirin-acetaminophen-caffeine (EXCEDRIN MIGRAINE) 250-250-65 MG per tablet Take 1 tablet by mouth every 6 (six) hours as needed for headache.    . diphenhydrAMINE (BENADRYL) 25 mg capsule Take 25 mg by mouth at bedtime as needed for itching, allergies or sleep.    Crystal Morris. escitalopram (LEXAPRO) 20 MG tablet Take 1 tablet by mouth daily.    Crystal Morris. Polyethyl Glycol-Propyl  Glycol (SYSTANE OP) Place 1 drop into both eyes at bedtime.    Crystal Morris. PREMPRO 0.3-1.5 MG per tablet Take 1 tablet by mouth daily.    Crystal Morris. zolpidem (AMBIEN CR) 12.5 MG CR tablet As directed  5   No current facility-administered medications for this visit.     Allergies:  Allergies  Allergen Reactions  . Oxycodone Itching    Past Medical History, Surgical history, Social history, and Family History were reviewed and updated.   Physical Exam: Blood pressure 129/82, pulse 92, temperature 98.4 F (36.9 C), temperature source Oral, resp. rate 18, height 5\' 2"  (1.575 m), weight 137 lb 6.4 oz (62.324 kg), SpO2 100 %. ECOG: 0 General appearance: alert and cooperative appeared in no distress. Head: Normocephalic, without obvious abnormality Neck: no adenopathy Lymph nodes: Cervical, supraclavicular, and axillary nodes normal. Heart:regular rate and rhythm, S1, S2 normal, no murmur, click, rub or gallop Lung:chest clear, no wheezing, rales, normal symmetric air entry Abdomin: soft, non-tender, without masses or organomegaly shifting dullness or ascites. EXT:no erythema, induration, or nodules   Lab Results: Lab Results  Component Value Date   WBC 4.0 03/23/2015   HGB 11.6 03/23/2015   HCT 37.4 03/23/2015   MCV 86.2 03/23/2015   PLT 302 03/23/2015     Chemistry      Component Value Date/Time   NA 139 01/01/2015 0930   NA 135 08/11/2014 1802   K 3.8 01/01/2015 0930   K 3.6 08/11/2014 1802   CL 102 08/11/2014 1802   CO2 27 01/01/2015 0930   CO2 25 08/11/2014 1802   BUN 15.1 01/01/2015 0930   BUN 19 08/11/2014 1802  CREATININE 1.1 01/01/2015 0930   CREATININE 1.26* 08/11/2014 1802      Component Value Date/Time   CALCIUM 9.3 01/01/2015 0930   CALCIUM 8.9 08/11/2014 1802   ALKPHOS 35* 01/01/2015 0930   AST 18 01/01/2015 0930   ALT 17 01/01/2015 0930   BILITOT 0.20 01/01/2015 0930       Impression and Plan:  49 year old woman with the following issues:  1. Iron  deficiency anemia diagnosed in April 2016 with a hemoglobin of 8.8, MCV of 81 ferritin of 18. She received IV iron infusion in 2 separate occasions including April 2016 and August 2016. Her iron stores are close to adequate at this time with a hemoglobin have normalized. The plan is to continue to measure her iron levels periodically and replace as needed. She does not report any GI blood losses and predominantly related to menstruation.  2. Fatigue and tiredness: I think it's multifactorial with elements of possible thyroid disease. She had noted weight gain and she'll follow-up with her primary care provider regarding this issue.  3. Colon cancer screening: I continue to urge her to obtain a colonoscopy given her iron deficiency as well as the fact that she is 49 years of age and will require colon cancer screening.     [facility], MD 10/18/20168:40 AM

## 2015-03-23 NOTE — Telephone Encounter (Signed)
Gave adn pritned appt sched and avs for pt for Jan 2017 °

## 2015-05-04 ENCOUNTER — Encounter: Payer: Self-pay | Admitting: Oncology

## 2015-05-04 NOTE — Progress Notes (Signed)
Received medical release form from [employer] requesting bills from 07/21/14 to present.  I faxed bills on 04/26/15.  I've left 5 messages for Crystal Morris [phone] to confirmed she received the requested info with no return call.

## 2015-06-03 ENCOUNTER — Other Ambulatory Visit: Payer: Self-pay | Admitting: Nurse Practitioner

## 2015-06-10 MED FILL — ZOLPIDEM TART ER 12.5 MG TA: 12.5 | 30 days supply | Qty: 30 | Fill #0

## 2015-06-15 ENCOUNTER — Ambulatory Visit: Payer: 59 | Admitting: Oncology

## 2015-06-15 ENCOUNTER — Other Ambulatory Visit: Payer: 59

## 2015-07-08 MED FILL — ESCITALOPRAM 20 MG TABLET: 20 | 30 days supply | Qty: 30 | Fill #1

## 2015-07-08 MED FILL — PREMPRO 0.625-5 MG TABLET: 0.625-5 | 28 days supply | Qty: 28 | Fill #1

## 2015-07-08 MED FILL — ZOLPIDEM TART ER 12.5 MG TA: 12.5 | 30 days supply | Qty: 30 | Fill #1

## 2015-08-09 MED FILL — PREMPRO 0.625-5 MG TABLET: 0.625-5 | 28 days supply | Qty: 28 | Fill #2

## 2015-08-09 MED FILL — ESCITALOPRAM 20 MG TABLET: 20 | 30 days supply | Qty: 30 | Fill #2

## 2015-08-09 MED FILL — ZOLPIDEM TART ER 12.5 MG TA: 12.5 | 30 days supply | Qty: 30 | Fill #2

## 2015-08-23 DIAGNOSIS — F329 Major depressive disorder, single episode, unspecified: Secondary | ICD-10-CM | POA: Diagnosis not present

## 2015-09-08 MED FILL — PREMPRO 0.625-5 MG TABLET: 0.625-5 | 28 days supply | Qty: 28 | Fill #3

## 2015-09-08 MED FILL — ESCITALOPRAM 20 MG TABLET: 20 | 30 days supply | Qty: 30 | Fill #3

## 2015-09-08 MED FILL — ZOLPIDEM TART ER 12.5 MG TA: 12.5 | 30 days supply | Qty: 30 | Fill #3

## 2015-09-21 DIAGNOSIS — F329 Major depressive disorder, single episode, unspecified: Secondary | ICD-10-CM | POA: Diagnosis not present

## 2015-10-05 MED FILL — ESCITALOPRAM 20 MG TABLET: 20 | 30 days supply | Qty: 30 | Fill #4

## 2015-10-05 MED FILL — PREMPRO 0.625-5 MG TABLET: 0.625-5 | 28 days supply | Qty: 28 | Fill #4

## 2015-10-06 MED FILL — ZOLPIDEM TART ER 12.5 MG TA: 12.5 | 30 days supply | Qty: 30 | Fill #4

## 2015-10-06 MED FILL — FLUoxetine HCL 20 MG TABS: 20 | 30 days supply | Qty: 30 | Fill #0

## 2015-10-08 MED FILL — RESTASIS 0.05% EYE EMULSION: 0.05 | 90 days supply | Qty: 180 | Fill #0

## 2015-10-29 MED FILL — PREMPRO 0.625-5 MG TABLET: 0.625-5 | 28 days supply | Qty: 28 | Fill #0

## 2015-10-29 MED FILL — FLUoxetine HCL 20 MG TABS: 20 | 30 days supply | Qty: 30 | Fill #1

## 2015-11-03 MED FILL — ZOLPIDEM TART ER 12.5 MG TA: 12.5 | 30 days supply | Qty: 30 | Fill #0

## 2015-11-29 MED FILL — FLUoxetine HCL 20 MG TABS: 20 | 30 days supply | Qty: 30 | Fill #2

## 2015-11-29 MED FILL — PREMPRO 0.625-5 MG TABLET: 0.625-5 | 28 days supply | Qty: 28 | Fill #1

## 2015-12-01 MED FILL — ZOLPIDEM TART ER 12.5 MG TA: 12.5 | 30 days supply | Qty: 30 | Fill #1

## 2015-12-03 IMAGING — CR DG HAND COMPLETE 3+V*L*
3 series · 3 of 3 positions shown · non-contrast
Comparison: 07/26/2014

CLINICAL DATA: Surgery on [DATE].  Pain.

EXAM:
LEFT HAND - COMPLETE 3+ VIEW

[x hand pa left]
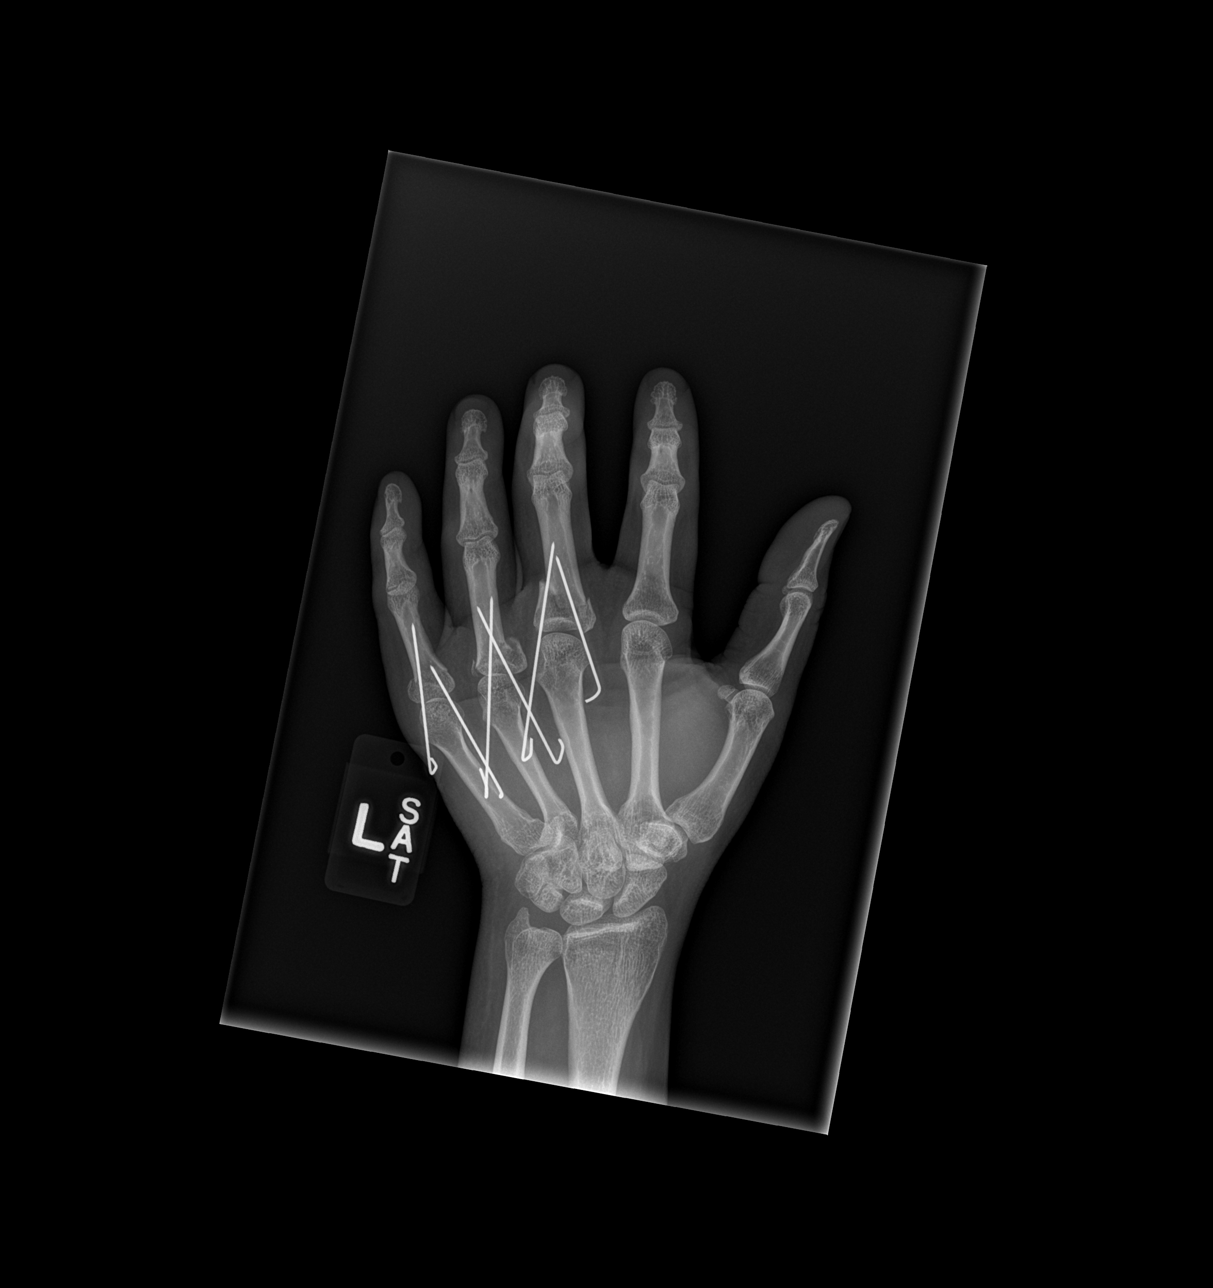

[x hand obl left]
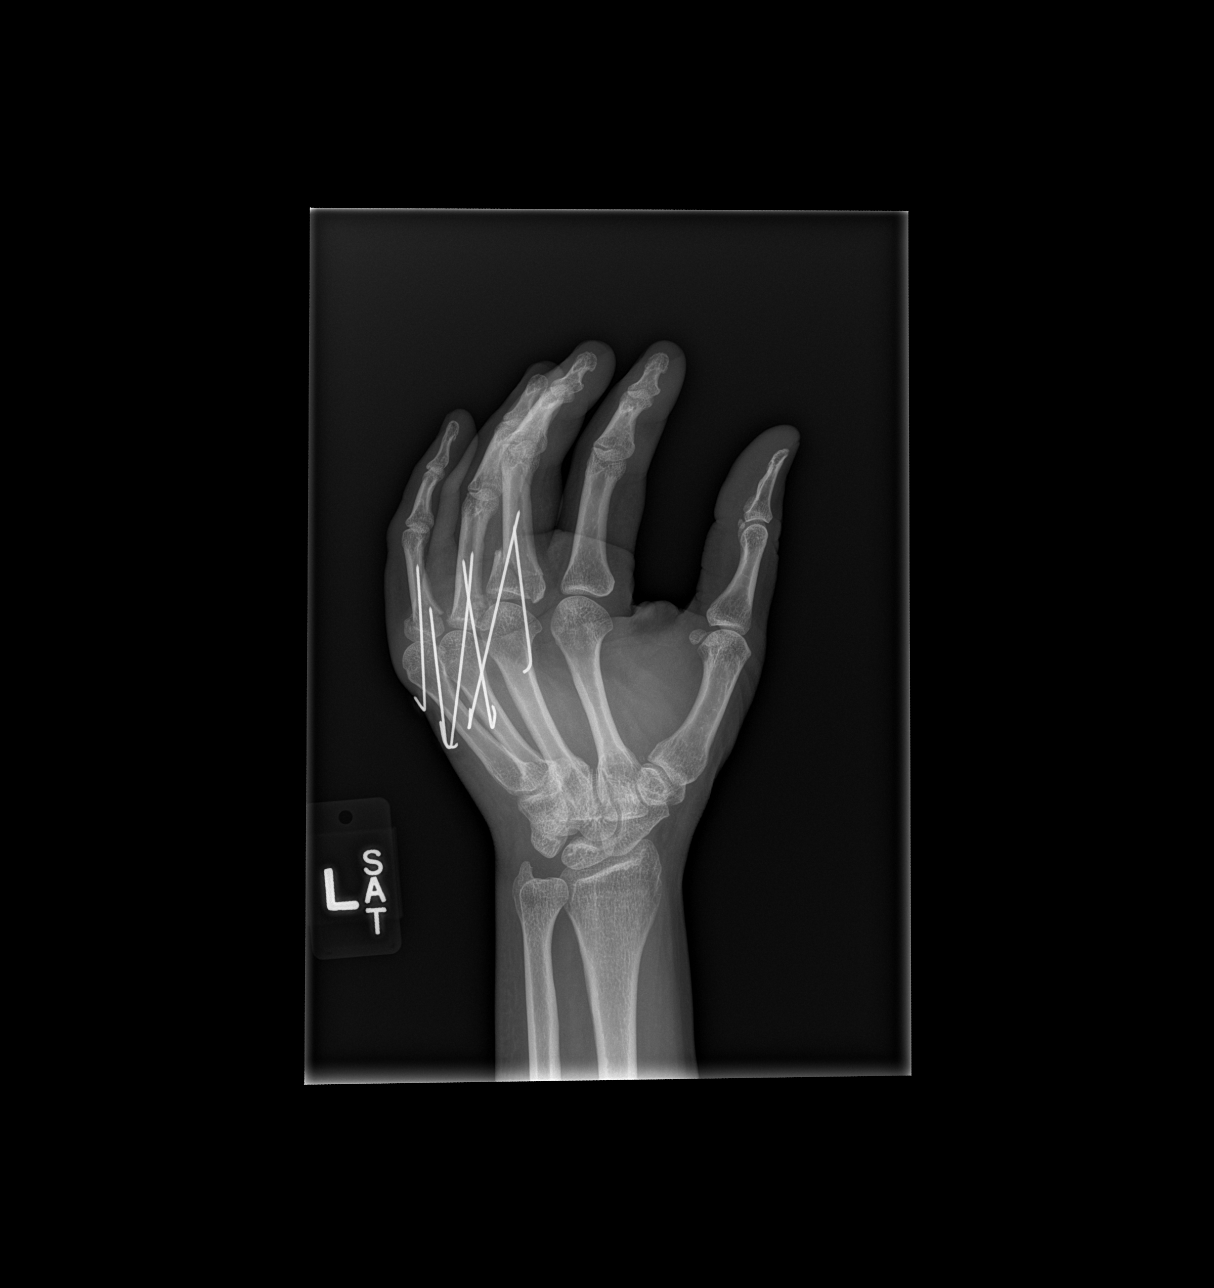

[x hand lat left]
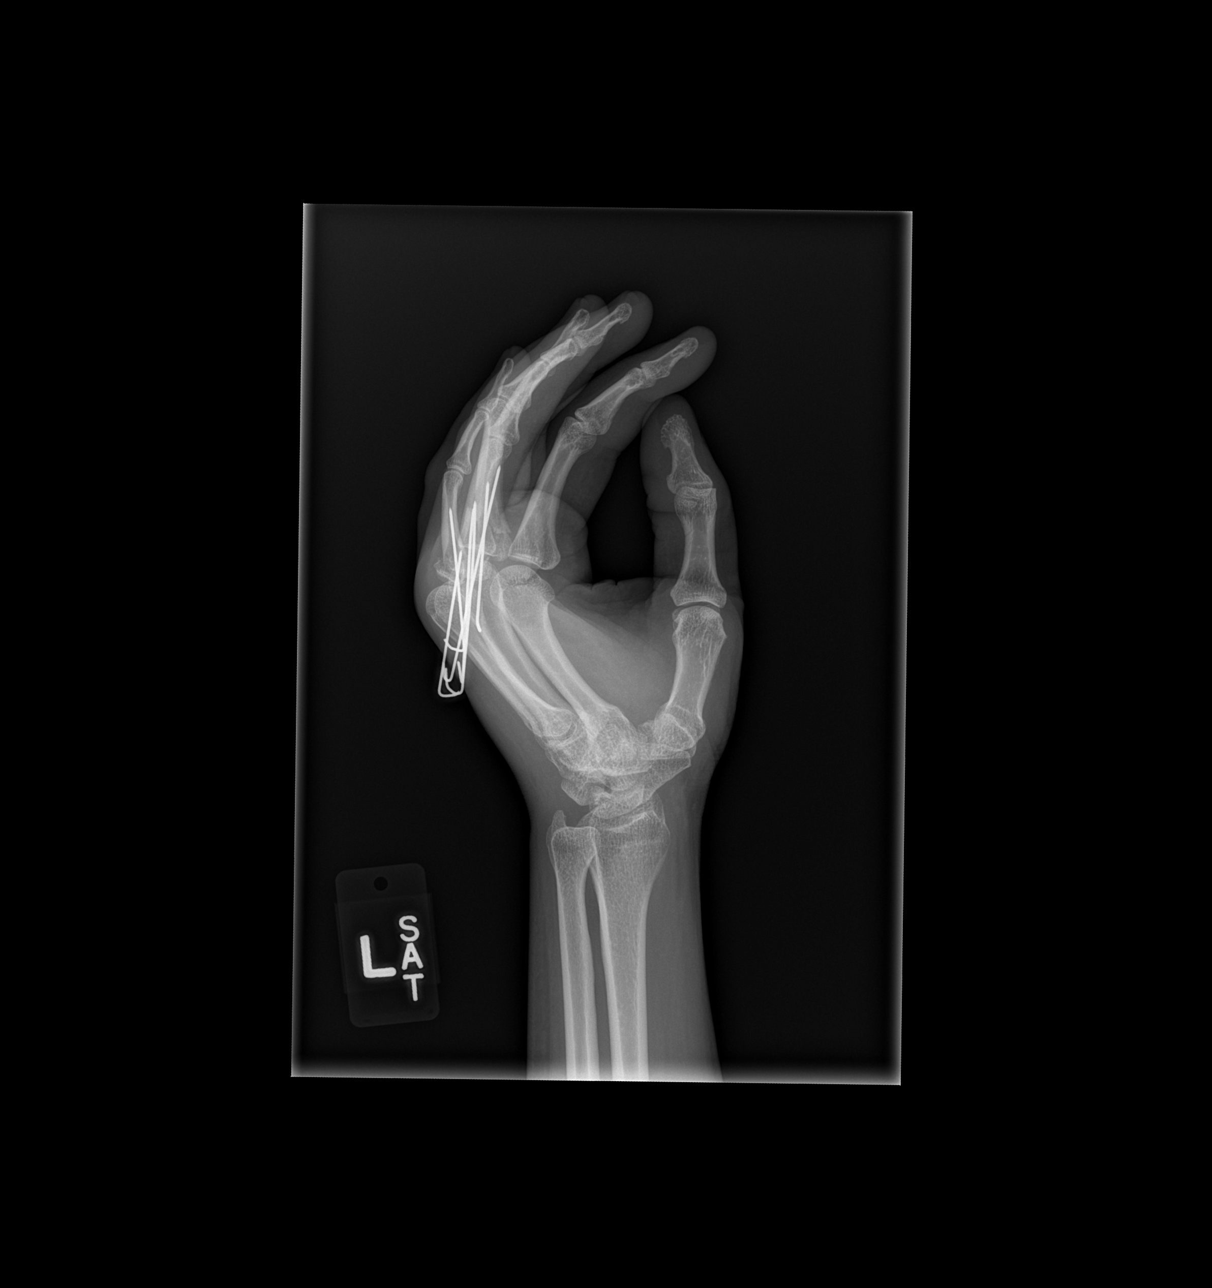

[3 of 3 positions shown; findings below may reference images not displayed]

FINDINGS: Multiple pins are identified across proximal phalangeal fractures of
the third through fifth digits. No new fracture identified.
Alignment is not significantly changed. Minimal if any healing has
occurred.
IMPRESSION: Fixation of proximal phalangeal fractures of the third through fifth
digits. No new findings.

## 2015-12-27 MED FILL — PREMPRO 0.625-5 MG TABLET: 0.625-5 | 28 days supply | Qty: 28 | Fill #2

## 2015-12-27 MED FILL — FLUoxetine HCL 20 MG TABS: 20 | 30 days supply | Qty: 30 | Fill #3

## 2015-12-29 MED FILL — ZOLPIDEM TART ER 12.5 MG TA: 12.5 | 30 days supply | Qty: 30 | Fill #2

## 2016-01-21 DIAGNOSIS — R41840 Attention and concentration deficit: Secondary | ICD-10-CM | POA: Diagnosis not present

## 2016-01-21 DIAGNOSIS — F419 Anxiety disorder, unspecified: Secondary | ICD-10-CM | POA: Diagnosis not present

## 2016-01-21 MED FILL — VYVANSE 20 MG CAPSULE: 20 | 30 days supply | Qty: 30 | Fill #0

## 2016-01-21 MED FILL — ESCITALOPRAM 20 MG TABLET: 20 | 30 days supply | Qty: 30 | Fill #0

## 2016-01-21 MED FILL — PREMPRO 0.625-5 MG TABLET: 0.625-5 | 28 days supply | Qty: 28 | Fill #0

## 2016-01-28 MED FILL — ZOLPIDEM TART ER 12.5 MG TA: 12.5 | 30 days supply | Qty: 30 | Fill #3

## 2016-02-10 MED FILL — VYVANSE 20 MG CAPSULE: 20 | 30 days supply | Qty: 30 | Fill #0

## 2016-02-25 MED FILL — PREMPRO 0.625-5 MG TABLET: 0.625-5 | 28 days supply | Qty: 28 | Fill #1

## 2016-02-25 MED FILL — ZOLPIDEM TART ER 12.5 MG TA: 12.5 | 30 days supply | Qty: 30 | Fill #4

## 2016-02-25 MED FILL — ESCITALOPRAM 20 MG TABLET: 20 | 30 days supply | Qty: 30 | Fill #1

## 2016-03-24 MED FILL — PREMPRO 0.625-5 MG TABLET: 0.625-5 | 28 days supply | Qty: 28 | Fill #2

## 2016-03-24 MED FILL — ZOLPIDEM TART ER 12.5 MG TA: 12.5 | 30 days supply | Qty: 30 | Fill #5

## 2016-03-24 MED FILL — ESCITALOPRAM 20 MG TABLET: 20 | 30 days supply | Qty: 30 | Fill #2

## 2016-03-30 DIAGNOSIS — J020 Streptococcal pharyngitis: Secondary | ICD-10-CM | POA: Diagnosis not present

## 2016-03-30 DIAGNOSIS — J029 Acute pharyngitis, unspecified: Secondary | ICD-10-CM | POA: Diagnosis not present

## 2016-03-30 MED FILL — AMOXICILLIN 875 MG TABLET: 875 | 10 days supply | Qty: 20 | Fill #0

## 2016-03-30 MED FILL — LIDOCAINE 2% VISCOUS SOLN: 2 | 5 days supply | Qty: 100 | Fill #0

## 2016-04-04 DIAGNOSIS — F909 Attention-deficit hyperactivity disorder, unspecified type: Secondary | ICD-10-CM | POA: Diagnosis not present

## 2016-04-04 DIAGNOSIS — K5904 Chronic idiopathic constipation: Secondary | ICD-10-CM | POA: Diagnosis not present

## 2016-04-04 DIAGNOSIS — F329 Major depressive disorder, single episode, unspecified: Secondary | ICD-10-CM | POA: Diagnosis not present

## 2016-04-26 MED FILL — ESCITALOPRAM 20 MG TABLET: 20 | 30 days supply | Qty: 30 | Fill #3

## 2016-04-26 MED FILL — PREMPRO 0.625-5 MG TABLET: 0.625-5 | 28 days supply | Qty: 28 | Fill #3

## 2016-05-09 MED FILL — LINZESS 72 MCG CAPSULE: 72 | 30 days supply | Qty: 30 | Fill #0

## 2016-05-17 MED FILL — PREMPRO 0.625-5 MG TABLET: 0.625-5 | 28 days supply | Qty: 28 | Fill #4

## 2016-05-23 MED FILL — ESCITALOPRAM 20 MG TABLET: 20 | 30 days supply | Qty: 30 | Fill #4

## 2016-05-26 MED FILL — ZOLPIDEM TART ER 12.5 MG TA: 12.5 | 30 days supply | Qty: 30 | Fill #0

## 2016-06-07 DIAGNOSIS — N898 Other specified noninflammatory disorders of vagina: Secondary | ICD-10-CM | POA: Diagnosis not present

## 2016-06-07 DIAGNOSIS — N760 Acute vaginitis: Secondary | ICD-10-CM | POA: Diagnosis not present

## 2016-06-07 DIAGNOSIS — N8110 Cystocele, unspecified: Secondary | ICD-10-CM | POA: Diagnosis not present

## 2016-06-07 DIAGNOSIS — K623 Rectal prolapse: Secondary | ICD-10-CM | POA: Diagnosis not present

## 2016-06-07 MED FILL — VYVANSE 50 MG CAPSULE: 50 | 30 days supply | Qty: 30 | Fill #0

## 2016-06-07 MED FILL — SULFAMETHOXAZOLE/TMP DS TAB: 800-160 | 7 days supply | Qty: 14 | Fill #0

## 2016-06-12 MED FILL — FLUCONAZOLE 100 MG TABLET: 100 | 3 days supply | Qty: 3 | Fill #0

## 2016-06-17 DIAGNOSIS — J209 Acute bronchitis, unspecified: Secondary | ICD-10-CM | POA: Diagnosis not present

## 2016-06-17 DIAGNOSIS — R05 Cough: Secondary | ICD-10-CM | POA: Diagnosis not present

## 2016-06-17 DIAGNOSIS — R5381 Other malaise: Secondary | ICD-10-CM | POA: Diagnosis not present

## 2016-06-17 DIAGNOSIS — J069 Acute upper respiratory infection, unspecified: Secondary | ICD-10-CM | POA: Diagnosis not present

## 2016-06-23 DIAGNOSIS — J069 Acute upper respiratory infection, unspecified: Secondary | ICD-10-CM | POA: Diagnosis not present

## 2016-06-30 DIAGNOSIS — N898 Other specified noninflammatory disorders of vagina: Secondary | ICD-10-CM | POA: Diagnosis not present

## 2016-06-30 DIAGNOSIS — N9089 Other specified noninflammatory disorders of vulva and perineum: Secondary | ICD-10-CM | POA: Diagnosis not present

## 2016-06-30 MED FILL — valACYclovir HCL 1 GM TABS: 1 | 10 days supply | Qty: 20 | Fill #0

## 2016-06-30 MED FILL — metroNIDAZOLE 500 MG TABS: 500 | 7 days supply | Qty: 14 | Fill #0

## 2016-06-30 MED FILL — LIDOCAINE 2% VISCOUS SOLN: 2 | 5 days supply | Qty: 100 | Fill #0

## 2016-06-30 MED FILL — ESCITALOPRAM 20 MG TABLET: 20 | 30 days supply | Qty: 30 | Fill #5

## 2016-06-30 MED FILL — PREMPRO 0.625-5 MG TABLET: 0.625-5 | 28 days supply | Qty: 28 | Fill #5

## 2016-06-30 MED FILL — ZOLPIDEM TART ER 12.5 MG TA: 12.5 | 30 days supply | Qty: 30 | Fill #1

## 2016-06-30 MED FILL — LINZESS 72 MCG CAPSULE: 72 | 30 days supply | Qty: 30 | Fill #1

## 2016-07-05 MED FILL — VYVANSE 50 MG CAPSULE: 50 | 30 days supply | Qty: 30 | Fill #0

## 2016-07-17 DIAGNOSIS — N949 Unspecified condition associated with female genital organs and menstrual cycle: Secondary | ICD-10-CM | POA: Diagnosis not present

## 2016-07-17 DIAGNOSIS — N898 Other specified noninflammatory disorders of vagina: Secondary | ICD-10-CM | POA: Diagnosis not present

## 2016-07-17 MED FILL — FLUCONAZOLE 150 MG TAB: 150 | 3 days supply | Qty: 2 | Fill #0

## 2016-07-17 MED FILL — LIDOCAINE 2% VISCOUS SOLN: 2 | 5 days supply | Qty: 100 | Fill #0

## 2016-07-21 DIAGNOSIS — N950 Postmenopausal bleeding: Secondary | ICD-10-CM | POA: Insufficient documentation

## 2016-07-21 DIAGNOSIS — Z124 Encounter for screening for malignant neoplasm of cervix: Secondary | ICD-10-CM | POA: Diagnosis not present

## 2016-07-21 DIAGNOSIS — N765 Ulceration of vagina: Secondary | ICD-10-CM | POA: Diagnosis not present

## 2016-07-21 DIAGNOSIS — R102 Pelvic and perineal pain: Secondary | ICD-10-CM | POA: Diagnosis not present

## 2016-07-21 LAB — HM PAP SMEAR: HM Pap smear: NORMAL

## 2016-07-21 MED FILL — CLOBETASOL 0.05% OINTMENT: 0.05 | 10 days supply | Qty: 30 | Fill #0

## 2016-07-28 MED FILL — SULFAMETHOXAZOLE/TMP DS TAB: 800-160 | 7 days supply | Qty: 14 | Fill #0

## 2016-08-01 DIAGNOSIS — N765 Ulceration of vagina: Secondary | ICD-10-CM | POA: Diagnosis not present

## 2016-08-01 DIAGNOSIS — N83202 Unspecified ovarian cyst, left side: Secondary | ICD-10-CM | POA: Diagnosis not present

## 2016-08-01 DIAGNOSIS — N950 Postmenopausal bleeding: Secondary | ICD-10-CM | POA: Diagnosis not present

## 2016-08-01 DIAGNOSIS — N949 Unspecified condition associated with female genital organs and menstrual cycle: Secondary | ICD-10-CM | POA: Diagnosis not present

## 2016-08-01 MED FILL — CLOBETASOL 0.05% OINTMENT: 0.05 | 20 days supply | Qty: 30 | Fill #0

## 2016-08-04 MED FILL — VYVANSE 50 MG CAPSULE: 50 | 30 days supply | Qty: 30 | Fill #0

## 2016-08-08 MED FILL — PREMPRO 0.625-5 MG TABLET: 0.625-5 | 28 days supply | Qty: 28 | Fill #0

## 2016-08-08 MED FILL — ESCITALOPRAM 20 MG TABLET: 20 | 30 days supply | Qty: 30 | Fill #0

## 2016-08-08 MED FILL — ZOLPIDEM TART ER 12.5 MG TA: 12.5 | 30 days supply | Qty: 30 | Fill #0

## 2016-08-09 MED FILL — LINZESS 72 MCG CAPSULE: 72 | 30 days supply | Qty: 30 | Fill #0

## 2016-09-14 MED FILL — ZOLPIDEM TART ER 12.5 MG TA: 12.5 | 30 days supply | Qty: 30 | Fill #1

## 2016-09-14 MED FILL — ESCITALOPRAM 20 MG TABLET: 20 | 30 days supply | Qty: 30 | Fill #0

## 2016-09-27 MED FILL — PREMPRO 0.625-5 MG TABLET: 0.625-5 | 28 days supply | Qty: 28 | Fill #0

## 2016-09-28 MED FILL — VYVANSE 50 MG CAPSULE: 50 | 30 days supply | Qty: 30 | Fill #0

## 2016-10-27 MED FILL — LINZESS 72 MCG CAPSULE: 72 | 30 days supply | Qty: 30 | Fill #0

## 2016-10-27 MED FILL — ZOLPIDEM TART ER 12.5 MG TA: 12.5 | 30 days supply | Qty: 30 | Fill #0

## 2016-10-27 MED FILL — ESCITALOPRAM 20 MG TABLET: 20 | 30 days supply | Qty: 30 | Fill #0

## 2016-10-27 MED FILL — PREMPRO 0.625-5 MG TABLET: 0.625-5 | 28 days supply | Qty: 28 | Fill #0

## 2016-12-19 MED FILL — ZOLPIDEM TART ER 12.5 MG TA: 12.5 | 30 days supply | Qty: 30 | Fill #1

## 2016-12-19 MED FILL — ESCITALOPRAM 20 MG TABLET: 20 | 30 days supply | Qty: 30 | Fill #1

## 2016-12-19 MED FILL — LINZESS 72 MCG CAPSULE: 72 | 30 days supply | Qty: 30 | Fill #1

## 2016-12-19 MED FILL — PREMPRO 0.625-5 MG TABLET: 0.625-5 | 28 days supply | Qty: 28 | Fill #1

## 2017-01-18 MED FILL — ZOLPIDEM TART ER 12.5 MG TA: 12.5 | 30 days supply | Qty: 30 | Fill #2

## 2017-01-18 MED FILL — ESCITALOPRAM 20 MG TABLET: 20 | 30 days supply | Qty: 30 | Fill #2

## 2017-01-18 MED FILL — LINZESS 72 MCG CAPSULE: 72 | 30 days supply | Qty: 30 | Fill #2

## 2017-01-18 MED FILL — PREMPRO 0.625-5 MG TABLET: 0.625-5 | 28 days supply | Qty: 28 | Fill #2

## 2017-03-01 MED FILL — ESCITALOPRAM 20 MG TABLET: 20 | 30 days supply | Qty: 30 | Fill #0

## 2017-03-01 MED FILL — LINZESS 72 MCG CAPSULE: 72 | 30 days supply | Qty: 30 | Fill #0

## 2017-03-01 MED FILL — PREMPRO 0.625-5 MG TABLET: 0.625-5 | 28 days supply | Qty: 28 | Fill #0

## 2017-03-15 ENCOUNTER — Other Ambulatory Visit: Payer: Self-pay | Admitting: Family Medicine

## 2017-03-15 DIAGNOSIS — F419 Anxiety disorder, unspecified: Secondary | ICD-10-CM | POA: Diagnosis not present

## 2017-03-15 DIAGNOSIS — Z0000 Encounter for general adult medical examination without abnormal findings: Secondary | ICD-10-CM | POA: Diagnosis not present

## 2017-03-15 DIAGNOSIS — E2839 Other primary ovarian failure: Secondary | ICD-10-CM

## 2017-03-15 DIAGNOSIS — Z23 Encounter for immunization: Secondary | ICD-10-CM | POA: Diagnosis not present

## 2017-03-15 DIAGNOSIS — R5383 Other fatigue: Secondary | ICD-10-CM | POA: Diagnosis not present

## 2017-03-15 DIAGNOSIS — R0602 Shortness of breath: Secondary | ICD-10-CM | POA: Diagnosis not present

## 2017-03-15 DIAGNOSIS — Z1231 Encounter for screening mammogram for malignant neoplasm of breast: Secondary | ICD-10-CM

## 2017-03-15 DIAGNOSIS — E559 Vitamin D deficiency, unspecified: Secondary | ICD-10-CM | POA: Diagnosis not present

## 2017-03-15 DIAGNOSIS — Z780 Asymptomatic menopausal state: Secondary | ICD-10-CM

## 2017-03-16 MED FILL — ZOLPIDEM TART ER 12.5 MG TA: 12.5 | 30 days supply | Qty: 30 | Fill #0

## 2017-04-05 ENCOUNTER — Ambulatory Visit: Payer: 59

## 2017-04-05 ENCOUNTER — Inpatient Hospital Stay
Admission: RE | Admit: 2017-04-05 | Discharge: 2017-04-05 | Disposition: A | Discharge status: Home / Self Care | Payer: 59 | Source: Ambulatory Visit | Attending: Family Medicine | Admitting: Family Medicine

## 2017-05-03 DIAGNOSIS — K5900 Constipation, unspecified: Secondary | ICD-10-CM | POA: Diagnosis not present

## 2017-05-03 DIAGNOSIS — R739 Hyperglycemia, unspecified: Secondary | ICD-10-CM | POA: Diagnosis not present

## 2017-05-03 DIAGNOSIS — R509 Fever, unspecified: Secondary | ICD-10-CM | POA: Diagnosis not present

## 2017-05-03 DIAGNOSIS — Z780 Asymptomatic menopausal state: Secondary | ICD-10-CM | POA: Diagnosis not present

## 2017-05-03 DIAGNOSIS — G4700 Insomnia, unspecified: Secondary | ICD-10-CM | POA: Diagnosis not present

## 2017-05-03 MED FILL — ESCITALOPRAM 20 MG TABLET: 20 | 30 days supply | Qty: 30 | Fill #0

## 2017-05-03 MED FILL — PREMPRO 0.625-5 MG TABLET: 0.625-5 | 28 days supply | Qty: 28 | Fill #0

## 2017-05-03 MED FILL — LINZESS 72 MCG CAPSULE: 72 | 30 days supply | Qty: 30 | Fill #0

## 2017-05-04 DIAGNOSIS — R0602 Shortness of breath: Secondary | ICD-10-CM | POA: Diagnosis not present

## 2017-05-04 DIAGNOSIS — R942 Abnormal results of pulmonary function studies: Secondary | ICD-10-CM | POA: Diagnosis not present

## 2017-05-04 DIAGNOSIS — N959 Unspecified menopausal and perimenopausal disorder: Secondary | ICD-10-CM | POA: Diagnosis not present

## 2017-05-04 DIAGNOSIS — Z780 Asymptomatic menopausal state: Secondary | ICD-10-CM | POA: Diagnosis not present

## 2017-05-07 MED FILL — ZOLPIDEM TART ER 12.5 MG TA: 12.5 | 30 days supply | Qty: 30 | Fill #0

## 2017-05-08 ENCOUNTER — Ambulatory Visit: Payer: 59 | Admitting: Internal Medicine

## 2017-05-28 ENCOUNTER — Ambulatory Visit: Payer: 59 | Admitting: Internal Medicine

## 2017-06-20 MED FILL — ESCITALOPRAM 20 MG TABLET: 20 | 30 days supply | Qty: 30 | Fill #0

## 2017-06-20 MED FILL — PREMPRO 0.625-5 MG TABLET: 0.625-5 | 28 days supply | Qty: 28 | Fill #1

## 2017-07-03 ENCOUNTER — Ambulatory Visit: Payer: Self-pay | Admitting: Internal Medicine

## 2017-07-03 ENCOUNTER — Ambulatory Visit: Payer: 59 | Admitting: Internal Medicine

## 2017-08-17 MED FILL — PREMPRO 0.625-5 MG TABLET: 0.625-5 | 28 days supply | Qty: 28 | Fill #2

## 2017-08-17 MED FILL — ESCITALOPRAM 20 MG TABLET: 20 | 30 days supply | Qty: 30 | Fill #1

## 2017-10-18 ENCOUNTER — Ambulatory Visit (INDEPENDENT_AMBULATORY_CARE_PROVIDER_SITE_OTHER): Payer: Commercial Managed Care - PPO | Admitting: Internal Medicine

## 2017-10-18 ENCOUNTER — Encounter: Payer: Self-pay | Admitting: Internal Medicine

## 2017-10-18 VITALS — BP 130/82 | HR 92 | Temp 98.1°F | Ht 61.75 in | Wt 152.0 lb

## 2017-10-18 DIAGNOSIS — N951 Menopausal and female climacteric states: Secondary | ICD-10-CM | POA: Insufficient documentation

## 2017-10-18 DIAGNOSIS — F5101 Primary insomnia: Secondary | ICD-10-CM | POA: Insufficient documentation

## 2017-10-18 DIAGNOSIS — R519 Headache, unspecified: Secondary | ICD-10-CM | POA: Insufficient documentation

## 2017-10-18 DIAGNOSIS — R51 Headache: Secondary | ICD-10-CM | POA: Diagnosis not present

## 2017-10-18 MED ORDER — ZOLPIDEM TARTRATE ER 12.5 MG PO TBCR: 12.5000 mg | EXTENDED_RELEASE_TABLET | Freq: Every day | ORAL | 0 refills | Status: DC

## 2017-10-18 MED ORDER — PREMPRO 0.3-1.5 MG PO TABS: 1.0000 | ORAL_TABLET | Freq: Every day | ORAL | 3 refills | Status: DC

## 2017-10-18 MED ORDER — ESCITALOPRAM OXALATE 20 MG PO TABS: 20.0000 mg | ORAL_TABLET | Freq: Every day | ORAL | 3 refills | Status: DC

## 2017-10-18 MED FILL — ESCITALOPRAM 20 MG TABLET: 20 | 90 days supply | Qty: 90 | Fill #0

## 2017-10-18 MED FILL — PREMPRO 0.3 MG-1.5 MG TAB: 0.3-1.5 | 84 days supply | Qty: 84 | Fill #0

## 2017-10-18 MED FILL — ZOLPIDEM TART ER 12.5 MG TA: 12.5 | 30 days supply | Qty: 30 | Fill #0

## 2017-10-18 NOTE — Assessment & Plan Note (Signed)
Controlled on Prempro, refilled today

## 2017-10-18 NOTE — Assessment & Plan Note (Signed)
Stable on Ambien, refilled today

## 2017-10-18 NOTE — Progress Notes (Signed)
HPI  Pt presents to the clinic today to establish care and for management of the conditions listed below. She is transferring care from [facility].  Frequent Headaches: These occur 2 x day. She is not sure what triggers this. She takes Lexapro daily (she denies anxiety, depression or mood disorder) and Excedrin Migraine with good relief.  Ambien: She has trouble falling asleep and staying asleep. She takes Ambien CR nightly with good relief. She would like a refill of this medication today.  Menopausal Symptoms: Mainly hot flashes. Controlled on Prempro.  Flu: 03/2017 Tetanus: ? 2016 Pap Smear: 2018 Mammogram: 2018 Colon Screening: 2014 Vision Screening: annually Dentist: biannually  Past Medical History:  Diagnosis Date  . History of chicken pox   . Medical history non-contributory   . Wears glasses     Current Outpatient Medications  Medication Sig Dispense Refill  . aspirin-acetaminophen-caffeine (EXCEDRIN MIGRAINE) 250-250-65 MG per tablet Take 1 tablet by mouth every 6 (six) hours as needed for headache.    . diphenhydrAMINE (BENADRYL) 25 mg capsule Take 25 mg by mouth at bedtime as needed for itching, allergies or sleep.    Marland Kitchen escitalopram (LEXAPRO) 20 MG tablet Take 1 tablet by mouth daily.    Bertram Gala Glycol-Propyl Glycol (SYSTANE OP) Place 1 drop into both eyes at bedtime.    Marland Kitchen PREMPRO 0.3-1.5 MG per tablet Take 1 tablet by mouth daily.    Marland Kitchen zolpidem (AMBIEN CR) 12.5 MG CR tablet As directed  5   No current facility-administered medications for this visit.     Allergies  Allergen Reactions  . Oxycodone Itching    No family history on file.  Social History   Socioeconomic History  . Marital status: Married    Spouse name: Not on file  . Number of children: Not on file  . Years of education: Not on file  . Highest education level: Not on file  Occupational History  . Not on file  Social Needs  . Financial resource strain: Not on file  . Food  insecurity:    Worry: Not on file    Inability: Not on file  . Transportation needs:    Medical: Not on file    Non-medical: Not on file  Tobacco Use  . Smoking status: Never Smoker  . Smokeless tobacco: Never Used  Substance and Sexual Activity  . Alcohol use: No  . Drug use: No  . Sexual activity: Not on file  Lifestyle  . Physical activity:    Days per week: Not on file    Minutes per session: Not on file  . Stress: Not on file  Relationships  . Social connections:    Talks on phone: Not on file    Gets together: Not on file    Attends religious service: Not on file    Active member of club or organization: Not on file    Attends meetings of clubs or organizations: Not on file    Relationship status: Not on file  . Intimate partner violence:    Fear of current or ex partner: Not on file    Emotionally abused: Not on file    Physically abused: Not on file    Forced sexual activity: Not on file  Other Topics Concern  . Not on file  Social History Narrative  . Not on file    ROS:  Constitutional: Pt reports headaches. Denies fever, malaise, fatigue, or abrupt weight changes.  HEENT: Denies eye pain, eye redness,  ear pain, ringing in the ears, wax buildup, runny nose, nasal congestion, bloody nose, or sore throat. Respiratory: Denies difficulty breathing, shortness of breath, cough or sputum production.   Cardiovascular: Denies chest pain, chest tightness, palpitations or swelling in the hands or feet.  Gastrointestinal: Denies abdominal pain, bloating, constipation, diarrhea or blood in the stool.  GU: Denies frequency, urgency, pain with urination, blood in urine, odor or discharge. Musculoskeletal: Denies decrease in range of motion, difficulty with gait, muscle pain or joint pain and swelling.  Skin: Denies redness, rashes, lesions or ulcercations.  Neurological: Pt reports insomnia. Denies dizziness, difficulty with memory, difficulty with speech or problems with  balance and coordination.  Psych: Denies anxiety, depression, SI/HI.  No other specific complaints in a complete review of systems (except as listed in HPI above).  PE:  BP 130/82   Pulse 92   Temp 98.1 F (36.7 C) (Oral)   Ht 5' 1.75" (1.568 m)   Wt 152 lb (68.9 kg)   LMP  (Approximate) Comment: 15 years ago  SpO2 97%   BMI 28.03 kg/m  Wt Readings from Last 3 Encounters:  10/18/17 152 lb (68.9 kg)  03/23/15 137 lb 6.4 oz (62.3 kg)  01/01/15 133 lb 3.2 oz (60.4 kg)    General: Appears her stated age, well developed, well nourished in NAD. Skin: Dry and intact. Cardiovascular: Normal rate and rhythm. S1,S2 noted.  No murmur, rubs or gallops noted.  Pulmonary/Chest: Normal effort and positive vesicular breath sounds. No respiratory distress. No wheezes, rales or ronchi noted.  Neurological: Alert and oriented.   Psychiatric: Mood and affect normal. Behavior is normal. Judgment and thought content normal.    BMET    Component Value Date/Time   NA 138 03/23/2015 0808   K 4.9 03/23/2015 0808   CL 102 08/11/2014 1802   CO2 25 03/23/2015 0808   GLUCOSE 86 03/23/2015 0808   BUN 19.1 03/23/2015 0808   CREATININE 1.0 03/23/2015 0808   CALCIUM 8.7 03/23/2015 0808   GFRNONAA 49 (L) 08/11/2014 1802   GFRAA 57 (L) 08/11/2014 1802    Lipid Panel  No results found for: CHOL, TRIG, HDL, CHOLHDL, VLDL, LDLCALC  CBC    Component Value Date/Time   WBC 4.0 03/23/2015 0808   WBC 4.1 08/11/2014 1802   RBC 4.34 03/23/2015 0808   RBC 4.07 08/11/2014 1802   HGB 11.6 03/23/2015 0808   HCT 37.4 03/23/2015 0808   PLT 302 03/23/2015 0808   MCV 86.2 03/23/2015 0808   MCH 26.7 03/23/2015 0808   MCH 24.6 (L) 08/11/2014 1802   MCHC 31.0 (L) 03/23/2015 0808   MCHC 30.3 08/11/2014 1802   RDW 13.0 03/23/2015 0808   LYMPHSABS 1.5 03/23/2015 0808   MONOABS 0.3 03/23/2015 0808   EOSABS 0.0 03/23/2015 0808   BASOSABS 0.0 03/23/2015 0808    Hgb A1C No results found for:  HGBA1C   Assessment and Plan:

## 2017-10-18 NOTE — Assessment & Plan Note (Signed)
Chronic but stable on Lexapro and Excedrin Discussed how Lexapro may not be the best med for her and we should consider weaning off this in the future if she does not have any anxiety or depression Will monitor

## 2017-10-18 NOTE — Patient Instructions (Signed)

## 2017-11-30 ENCOUNTER — Other Ambulatory Visit: Payer: Self-pay | Admitting: Internal Medicine

## 2017-11-30 DIAGNOSIS — F5101 Primary insomnia: Secondary | ICD-10-CM

## 2017-11-30 MED FILL — ZOLPIDEM TART ER 12.5 MG TA: 12.5 | 30 days supply | Qty: 30 | Fill #0

## 2017-11-30 NOTE — Telephone Encounter (Signed)
Last filled 10/18/17 at [facility] for insomnia... Please advise

## 2018-01-07 ENCOUNTER — Other Ambulatory Visit: Payer: Self-pay | Admitting: Internal Medicine

## 2018-01-07 DIAGNOSIS — F5101 Primary insomnia: Secondary | ICD-10-CM

## 2018-01-07 MED FILL — ESCITALOPRAM 20 MG TABLET: 20 | 90 days supply | Qty: 90 | Fill #1

## 2018-01-07 MED FILL — PREMPRO 0.3 MG-1.5 MG TAB: 0.3-1.5 | 84 days supply | Qty: 84 | Fill #1

## 2018-01-09 MED FILL — ZOLPIDEM TART ER 12.5 MG TA: 12.5 | 30 days supply | Qty: 30 | Fill #0

## 2018-01-09 NOTE — Telephone Encounter (Signed)
Last filled 11/30/17.... Please advise

## 2018-02-15 ENCOUNTER — Other Ambulatory Visit: Payer: Self-pay | Admitting: Internal Medicine

## 2018-02-15 DIAGNOSIS — F5101 Primary insomnia: Secondary | ICD-10-CM

## 2018-02-15 NOTE — Telephone Encounter (Signed)
Last filled 01/09/18... Please advise

## 2018-02-21 ENCOUNTER — Telehealth: Payer: Self-pay | Admitting: Internal Medicine

## 2018-02-21 NOTE — Telephone Encounter (Signed)
Ok to work pt in, but if she misses appt, I won't work her in again.

## 2018-02-21 NOTE — Telephone Encounter (Signed)
Left message asking pt to call office  °

## 2018-02-21 NOTE — Telephone Encounter (Signed)
See below crm is it ok to work in pt?  Copied from CRM [phone]. Topic: Appointment Scheduling - Prior Auth Required for Appointment >> Feb 20, 2018  4:27 PM Trula SladeWalter, Linda F wrote: Reason for CRM:   Patient needs a physical before 04/03/18 for a report to be turned into her place of employment but there isn't an opening until 04/11/18.  Please advise.

## 2018-02-21 NOTE — Telephone Encounter (Signed)
Ok to work pt in, but if she misses appt, I won't work her in again. 

## 2018-02-28 NOTE — Telephone Encounter (Signed)
Pt calling to be worked in for a physical. Please call pt to schedule.

## 2018-03-01 NOTE — Telephone Encounter (Signed)
Left message asking pt to call office  °

## 2018-04-03 ENCOUNTER — Ambulatory Visit (INDEPENDENT_AMBULATORY_CARE_PROVIDER_SITE_OTHER): Payer: Commercial Managed Care - PPO | Admitting: Internal Medicine

## 2018-04-03 ENCOUNTER — Encounter: Payer: Self-pay | Admitting: Internal Medicine

## 2018-04-03 VITALS — BP 124/82 | HR 82 | Temp 98.1°F | Ht 61.0 in | Wt 136.0 lb

## 2018-04-03 DIAGNOSIS — Z23 Encounter for immunization: Secondary | ICD-10-CM

## 2018-04-03 DIAGNOSIS — Z0000 Encounter for general adult medical examination without abnormal findings: Secondary | ICD-10-CM

## 2018-04-03 LAB — LIPID PANEL
CHOL/HDL RATIO: 3
Cholesterol: 156 mg/dL (ref 0–200)
HDL: 52.3 mg/dL (ref >39.00)
LDL CALC: 74 mg/dL (ref 0–99)
NONHDL: 103.27
Triglycerides: 148 mg/dL (ref 0.0–149.0)
VLDL: 29.6 mg/dL (ref 0.0–40.0)

## 2018-04-03 LAB — CBC
HCT: 35.5 % — ABNORMAL LOW (ref 36.0–46.0)
Hemoglobin: 11.1 g/dL — ABNORMAL LOW (ref 12.0–15.0)
MCHC: 31.4 g/dL (ref 30.0–36.0)
MCV: 81.6 fl (ref 78.0–100.0)
PLATELETS: 302 10*3/uL (ref 150.0–400.0)
RBC: 4.35 Mil/uL (ref 3.87–5.11)
RDW: 14.1 % (ref 11.5–15.5)
WBC: 4 10*3/uL (ref 4.0–10.5)

## 2018-04-03 LAB — COMPREHENSIVE METABOLIC PANEL
ALT: 11 U/L (ref 0–35)
AST: 18 U/L (ref 0–37)
Albumin: 4 g/dL (ref 3.5–5.2)
Alkaline Phosphatase: 38 U/L — ABNORMAL LOW (ref 39–117)
BILIRUBIN TOTAL: 0.4 mg/dL (ref 0.2–1.2)
BUN: 15 mg/dL (ref 6–23)
CHLORIDE: 102 meq/L (ref 96–112)
CO2: 31 meq/L (ref 19–32)
CREATININE: 0.98 mg/dL (ref 0.40–1.20)
Calcium: 8.9 mg/dL (ref 8.4–10.5)
GFR: 76.25 mL/min (ref >60.00)
GLUCOSE: 78 mg/dL (ref 70–99)
Potassium: 3.7 mEq/L (ref 3.5–5.1)
SODIUM: 138 meq/L (ref 135–145)
Total Protein: 7 g/dL (ref 6.0–8.3)

## 2018-04-03 LAB — VITAMIN D 25 HYDROXY (VIT D DEFICIENCY, FRACTURES): VITD: 17.47 ng/mL — ABNORMAL LOW (ref 30.00–100.00)

## 2018-04-03 NOTE — Progress Notes (Signed)
Subjective:    Patient ID: Crystal Morris, female    DOB: 12/10/1965, 52 y.o.   MRN: [id number]  HPI  Pt presents to the clinic today for her annual exam.  She is asking for a refill of Vyvanse. She reports she was prescribed this for probable ADD by her previous PCP. She has not had a formal psych eval.  Flu: 03/2017 Tetanus: ? 2016 Pap Smear: 2018 Mammogram: 2018 Colon Screening: 2014 Vision Screening: annually Dentist: biannually  Diet: She does eat meat. She consumes fruits and veggies daily. She does eat fried foods. She drinks mostly water. Exercise: Walking  Review of Systems  Past Medical History:  Diagnosis Date  . History of chicken pox   . Medical history non-contributory   . Wears glasses     Current Outpatient Medications  Medication Sig Dispense Refill  . aspirin-acetaminophen-caffeine (EXCEDRIN MIGRAINE) 250-250-65 MG per tablet Take 1 tablet by mouth every 6 (six) hours as needed for headache.    . diphenhydrAMINE (BENADRYL) 25 mg capsule Take 25 mg by mouth at bedtime as needed for itching, allergies or sleep.    Marland Kitchen escitalopram (LEXAPRO) 20 MG tablet Take 1 tablet (20 mg total) by mouth daily. 90 tablet 3  . Polyethyl Glycol-Propyl Glycol (SYSTANE OP) Place 1 drop into both eyes at bedtime.    Marland Kitchen PREMPRO 0.3-1.5 MG tablet Take 1 tablet by mouth daily. 90 tablet 3  . zolpidem (AMBIEN CR) 12.5 MG CR tablet TAKE 1 TABLET BY MOUTH AT BEDTIME AS DIRECTED 30 tablet 0   No current facility-administered medications for this visit.     Allergies  Allergen Reactions  . Oxycodone Itching    No family history on file.  Social History   Socioeconomic History  . Marital status: Married    Spouse name: Not on file  . Number of children: Not on file  . Years of education: Not on file  . Highest education level: Not on file  Occupational History  . Not on file  Social Needs  . Financial resource strain: Not on file  . Food insecurity:    Worry: Not on  file    Inability: Not on file  . Transportation needs:    Medical: Not on file    Non-medical: Not on file  Tobacco Use  . Smoking status: Never Smoker  . Smokeless tobacco: Never Used  Substance and Sexual Activity  . Alcohol use: No  . Drug use: No  . Sexual activity: Not on file  Lifestyle  . Physical activity:    Days per week: Not on file    Minutes per session: Not on file  . Stress: Not on file  Relationships  . Social connections:    Talks on phone: Not on file    Gets together: Not on file    Attends religious service: Not on file    Active member of club or organization: Not on file    Attends meetings of clubs or organizations: Not on file    Relationship status: Not on file  . Intimate partner violence:    Fear of current or ex partner: Not on file    Emotionally abused: Not on file    Physically abused: Not on file    Forced sexual activity: Not on file  Other Topics Concern  . Not on file  Social History Narrative  . Not on file     Constitutional: Pt reports intermittent headaches. Denies fever, malaise, fatigue, headache or  abrupt weight changes.  HEENT: Denies eye pain, eye redness, ear pain, ringing in the ears, wax buildup, runny nose, nasal congestion, bloody nose, or sore throat. Respiratory: Denies difficulty breathing, shortness of breath, cough or sputum production.   Cardiovascular: Denies chest pain, chest tightness, palpitations or swelling in the hands or feet.  Gastrointestinal: Denies abdominal pain, bloating, constipation, diarrhea or blood in the stool.  GU: Denies urgency, frequency, pain with urination, burning sensation, blood in urine, odor or discharge. Musculoskeletal: Denies decrease in range of motion, difficulty with gait, muscle pain or joint pain and swelling.  Skin: Denies redness, rashes, lesions or ulcercations.  Neurological: Pt reports insomnia and inattention. Denies dizziness, difficulty with memory, difficulty with  speech or problems with balance and coordination.  Psych: Denies anxiety, depression, SI/HI.  No other specific complaints in a complete review of systems (except as listed in HPI above).     Objective:   Physical Exam   BP 124/82   Pulse 82   Temp 98.1 F (36.7 C) (Oral)   Ht 5\' 1"  (1.549 m)   Wt 136 lb (61.7 kg)   SpO2 100%   BMI 25.70 kg/m  Wt Readings from Last 3 Encounters:  04/03/18 136 lb (61.7 kg)  10/18/17 152 lb (68.9 kg)  03/23/15 137 lb 6.4 oz (62.3 kg)    General: Appears her stated age, well developed, well nourished in NAD. Skin: Warm, dry and intact.  HEENT: Head: normal shape and size; Eyes: sclera white, no icterus, conjunctiva pink, PERRLA and EOMs intact; Ears: Tm's gray and intact, normal light reflex; Throat/Mouth: Teeth present, mucosa pink and moist, no exudate, lesions or ulcerations noted.  Neck:  Neck supple, trachea midline. No masses, lumps or thyromegaly present.  Cardiovascular: Normal rate and rhythm. S1,S2 noted.  No murmur, rubs or gallops noted. No JVD or BLE edema. No carotid bruits noted. Pulmonary/Chest: Normal effort and positive vesicular breath sounds. No respiratory distress. No wheezes, rales or ronchi noted.  Abdomen: Soft and nontender. Normal bowel sounds. No distention or masses noted. Liver, spleen and kidneys non palpable. Musculoskeletal: Strength 5/5 BUE/BLE. No difficulty with gait.  Neurological: Alert and oriented. Cranial nerves II-XII grossly intact. Coordination normal.  Psychiatric: Mood and affect normal. Behavior is normal. Judgment and thought content normal.   BMET    Component Value Date/Time   NA 138 03/23/2015 0808   K 4.9 03/23/2015 0808   CL 102 08/11/2014 1802   CO2 25 03/23/2015 0808   GLUCOSE 86 03/23/2015 0808   BUN 19.1 03/23/2015 0808   CREATININE 1.0 03/23/2015 0808   CALCIUM 8.7 03/23/2015 0808   GFRNONAA 49 (L) 08/11/2014 1802   GFRAA 57 (L) 08/11/2014 1802    Lipid Panel  No results found  for: CHOL, TRIG, HDL, CHOLHDL, VLDL, LDLCALC  CBC    Component Value Date/Time   WBC 4.0 03/23/2015 0808   WBC 4.1 08/11/2014 1802   RBC 4.34 03/23/2015 0808   RBC 4.07 08/11/2014 1802   HGB 11.6 03/23/2015 0808   HCT 37.4 03/23/2015 0808   PLT 302 03/23/2015 0808   MCV 86.2 03/23/2015 0808   MCH 26.7 03/23/2015 0808   MCH 24.6 (L) 08/11/2014 1802   MCHC 31.0 (L) 03/23/2015 0808   MCHC 30.3 08/11/2014 1802   RDW 13.0 03/23/2015 0808   LYMPHSABS 1.5 03/23/2015 0808   MONOABS 0.3 03/23/2015 0808   EOSABS 0.0 03/23/2015 0808   BASOSABS 0.0 03/23/2015 0808    Hgb A1C No results  found for: HGBA1C         Assessment & Plan:   Preventative Health Maintenance:  Flu shot today Tetanus UTD Pap smear UTD Advised her to schedule her mammogram- ordered by her GYN Colon screening UTD Encouraged her to consume a balanced diet and exercise regimen Advised her to see an eye doctor and dentist annually Will check CBC, CMET, Lipid, Vit D today  Inattention:  Offered referral to psychology, but she declines at this time  RTC in 1 year, sooner if needed Nicki Reaper, NP

## 2018-04-03 NOTE — Patient Instructions (Signed)
Health Maintenance for Postmenopausal Women Menopause is a normal process in which your reproductive ability comes to an end. This process happens gradually over a span of months to years, usually between the ages of 22 and 9. Menopause is complete when you have missed 12 consecutive menstrual periods. It is important to talk with your health care provider about some of the most common conditions that affect postmenopausal women, such as heart disease, cancer, and bone loss (osteoporosis). Adopting a healthy lifestyle and getting preventive care can help to promote your health and wellness. Those actions can also lower your chances of developing some of these common conditions. What should I know about menopause? During menopause, you may experience a number of symptoms, such as:  Moderate-to-severe hot flashes.  Night sweats.  Decrease in sex drive.  Mood swings.  Headaches.  Tiredness.  Irritability.  Memory problems.  Insomnia.  Choosing to treat or not to treat menopausal changes is an individual decision that you make with your health care provider. What should I know about hormone replacement therapy and supplements? Hormone therapy products are effective for treating symptoms that are associated with menopause, such as hot flashes and night sweats. Hormone replacement carries certain risks, especially as you become older. If you are thinking about using estrogen or estrogen with progestin treatments, discuss the benefits and risks with your health care provider. What should I know about heart disease and stroke? Heart disease, heart attack, and stroke become more likely as you age. This may be due, in part, to the hormonal changes that your body experiences during menopause. These can affect how your body processes dietary fats, triglycerides, and cholesterol. Heart attack and stroke are both medical emergencies. There are many things that you can do to help prevent heart disease  and stroke:  Have your blood pressure checked at least every 1-2 years. High blood pressure causes heart disease and increases the risk of stroke.  If you are 53-22 years old, ask your health care provider if you should take aspirin to prevent a heart attack or a stroke.  Do not use any tobacco products, including cigarettes, chewing tobacco, or electronic cigarettes. If you need help quitting, ask your health care provider.  It is important to eat a healthy diet and maintain a healthy weight. ? Be sure to include plenty of vegetables, fruits, low-fat dairy products, and lean protein. ? Avoid eating foods that are high in solid fats, added sugars, or salt (sodium).  Get regular exercise. This is one of the most important things that you can do for your health. ? Try to exercise for at least 150 minutes each week. The type of exercise that you do should increase your heart rate and make you sweat. This is known as moderate-intensity exercise. ? Try to do strengthening exercises at least twice each week. Do these in addition to the moderate-intensity exercise.  Know your numbers.Ask your health care provider to check your cholesterol and your blood glucose. Continue to have your blood tested as directed by your health care provider.  What should I know about cancer screening? There are several types of cancer. Take the following steps to reduce your risk and to catch any cancer development as early as possible. Breast Cancer  Practice breast self-awareness. ? This means understanding how your breasts normally appear and feel. ? It also means doing regular breast self-exams. Let your health care provider know about any changes, no matter how small.  If you are 40  or older, have a clinician do a breast exam (clinical breast exam or CBE) every year. Depending on your age, family history, and medical history, it may be recommended that you also have a yearly breast X-ray (mammogram).  If you  have a family history of breast cancer, talk with your health care provider about genetic screening.  If you are at high risk for breast cancer, talk with your health care provider about having an MRI and a mammogram every year.  Breast cancer (BRCA) gene test is recommended for women who have family members with BRCA-related cancers. Results of the assessment will determine the need for genetic counseling and BRCA1 and for BRCA2 testing. BRCA-related cancers include these types: ? Breast. This occurs in males or females. ? Ovarian. ? Tubal. This may also be called fallopian tube cancer. ? Cancer of the abdominal or pelvic lining (peritoneal cancer). ? Prostate. ? Pancreatic.  Cervical, Uterine, and Ovarian Cancer Your health care provider may recommend that you be screened regularly for cancer of the pelvic organs. These include your ovaries, uterus, and vagina. This screening involves a pelvic exam, which includes checking for microscopic changes to the surface of your cervix (Pap test).  For women ages 21-65, health care providers may recommend a pelvic exam and a Pap test every three years. For women ages 79-65, they may recommend the Pap test and pelvic exam, combined with testing for human papilloma virus (HPV), every five years. Some types of HPV increase your risk of cervical cancer. Testing for HPV may also be done on women of any age who have unclear Pap test results.  Other health care providers may not recommend any screening for nonpregnant women who are considered low risk for pelvic cancer and have no symptoms. Ask your health care provider if a screening pelvic exam is right for you.  If you have had past treatment for cervical cancer or a condition that could lead to cancer, you need Pap tests and screening for cancer for at least 20 years after your treatment. If Pap tests have been discontinued for you, your risk factors (such as having a new sexual partner) need to be  reassessed to determine if you should start having screenings again. Some women have medical problems that increase the chance of getting cervical cancer. In these cases, your health care provider may recommend that you have screening and Pap tests more often.  If you have a family history of uterine cancer or ovarian cancer, talk with your health care provider about genetic screening.  If you have vaginal bleeding after reaching menopause, tell your health care provider.  There are currently no reliable tests available to screen for ovarian cancer.  Lung Cancer Lung cancer screening is recommended for adults 69-62 years old who are at high risk for lung cancer because of a history of smoking. A yearly low-dose CT scan of the lungs is recommended if you:  Currently smoke.  Have a history of at least 30 pack-years of smoking and you currently smoke or have quit within the past 15 years. A pack-year is smoking an average of one pack of cigarettes per day for one year.  Yearly screening should:  Continue until it has been 15 years since you quit.  Stop if you develop a health problem that would prevent you from having lung cancer treatment.  Colorectal Cancer  This type of cancer can be detected and can often be prevented.  Routine colorectal cancer screening usually begins at  age 42 and continues through age 45.  If you have risk factors for colon cancer, your health care provider may recommend that you be screened at an earlier age.  If you have a family history of colorectal cancer, talk with your health care provider about genetic screening.  Your health care provider may also recommend using home test kits to check for hidden blood in your stool.  A small camera at the end of a tube can be used to examine your colon directly (sigmoidoscopy or colonoscopy). This is done to check for the earliest forms of colorectal cancer.  Direct examination of the colon should be repeated every  5-10 years until age 71. However, if early forms of precancerous polyps or small growths are found or if you have a family history or genetic risk for colorectal cancer, you may need to be screened more often.  Skin Cancer  Check your skin from head to toe regularly.  Monitor any moles. Be sure to tell your health care provider: ? About any new moles or changes in moles, especially if there is a change in a mole's shape or color. ? If you have a mole that is larger than the size of a pencil eraser.  If any of your family members has a history of skin cancer, especially at a young age, talk with your health care provider about genetic screening.  Always use sunscreen. Apply sunscreen liberally and repeatedly throughout the day.  Whenever you are outside, protect yourself by wearing long sleeves, pants, a wide-brimmed hat, and sunglasses.  What should I know about osteoporosis? Osteoporosis is a condition in which bone destruction happens more quickly than new bone creation. After menopause, you may be at an increased risk for osteoporosis. To help prevent osteoporosis or the bone fractures that can happen because of osteoporosis, the following is recommended:  If you are 46-71 years old, get at least 1,000 mg of calcium and at least 600 mg of vitamin D per day.  If you are older than age 55 but younger than age 65, get at least 1,200 mg of calcium and at least 600 mg of vitamin D per day.  If you are older than age 54, get at least 1,200 mg of calcium and at least 800 mg of vitamin D per day.  Smoking and excessive alcohol intake increase the risk of osteoporosis. Eat foods that are rich in calcium and vitamin D, and do weight-bearing exercises several times each week as directed by your health care provider. What should I know about how menopause affects my mental health? Depression may occur at any age, but it is more common as you become older. Common symptoms of depression  include:  Low or sad mood.  Changes in sleep patterns.  Changes in appetite or eating patterns.  Feeling an overall lack of motivation or enjoyment of activities that you previously enjoyed.  Frequent crying spells.  Talk with your health care provider if you think that you are experiencing depression. What should I know about immunizations? It is important that you get and maintain your immunizations. These include:  Tetanus, diphtheria, and pertussis (Tdap) booster vaccine.  Influenza every year before the flu season begins.  Pneumonia vaccine.  Shingles vaccine.  Your health care provider may also recommend other immunizations. This information is not intended to replace advice given to you by your health care provider. Make sure you discuss any questions you have with your health care provider. Document Released: 07/14/2005  Document Revised: 12/10/2015 Document Reviewed: 02/23/2015 Elsevier Interactive Patient Education  2018 [employer]  

## 2018-04-04 ENCOUNTER — Other Ambulatory Visit: Payer: Self-pay | Admitting: Internal Medicine

## 2018-04-04 DIAGNOSIS — E559 Vitamin D deficiency, unspecified: Secondary | ICD-10-CM

## 2018-04-04 MED ORDER — VITAMIN D (ERGOCALCIFEROL) 1.25 MG (50000 UNIT) PO CAPS: 50000.0000 [IU] | ORAL_CAPSULE | ORAL | 0 refills | Status: DC

## 2018-04-08 ENCOUNTER — Other Ambulatory Visit: Payer: Self-pay | Admitting: Internal Medicine

## 2018-04-08 DIAGNOSIS — F5101 Primary insomnia: Secondary | ICD-10-CM

## 2018-04-08 MED FILL — PREMPRO 0.3 MG-1.5 MG TAB: 0.3-1.5 | 84 days supply | Qty: 84 | Fill #2

## 2018-04-08 MED FILL — ESCITALOPRAM 20 MG TABLET: 20 | 90 days supply | Qty: 90 | Fill #2

## 2018-04-09 ENCOUNTER — Telehealth: Payer: Self-pay

## 2018-04-09 MED FILL — ZOLPIDEM TART ER 12.5 MG TA: 12.5 | 30 days supply | Qty: 30 | Fill #0

## 2018-04-09 NOTE — Telephone Encounter (Signed)
Left message on voicemail  Vitamin d is low. Sent in prescription vitamin d 50,000 units to take weekly x 12 weeks. Liver and kidney function are normal. Cholesterol looks good. Blood counts stable.   Repeat Vitamin D lab in 3 months, schedule lab only appt.

## 2018-04-09 NOTE — Telephone Encounter (Signed)
last filled 02/15/18... Please advise

## 2018-05-14 ENCOUNTER — Other Ambulatory Visit: Payer: Self-pay | Admitting: Internal Medicine

## 2018-05-14 DIAGNOSIS — F5101 Primary insomnia: Secondary | ICD-10-CM

## 2018-05-15 NOTE — Telephone Encounter (Signed)
Last filled 04/09/2018... Please advise

## 2018-05-24 MED FILL — VIT D2 1.25 MG (50,000 UNIT: 1.25 MG | 84 days supply | Qty: 12 | Fill #0

## 2018-05-24 MED FILL — ZOLPIDEM TART ER 12.5 MG TA: 12.5 | 30 days supply | Qty: 30 | Fill #0

## 2018-07-03 ENCOUNTER — Other Ambulatory Visit: Payer: Self-pay | Admitting: Internal Medicine

## 2018-07-03 DIAGNOSIS — F5101 Primary insomnia: Secondary | ICD-10-CM

## 2018-07-03 MED FILL — ESCITALOPRAM 20 MG TABLET: 20 | 90 days supply | Qty: 90 | Fill #3

## 2018-07-03 MED FILL — PREMPRO 0.3 MG-1.5 MG TAB: 0.3-1.5 | 84 days supply | Qty: 84 | Fill #3

## 2018-07-04 NOTE — Telephone Encounter (Signed)
Last filled 05/15/18...Marland Kitchen please advise

## 2018-07-05 MED FILL — ZOLPIDEM TART ER 12.5 MG TA: 12.5 | 30 days supply | Qty: 30 | Fill #0

## 2018-08-07 ENCOUNTER — Other Ambulatory Visit: Payer: Self-pay | Admitting: Internal Medicine

## 2018-08-07 DIAGNOSIS — F5101 Primary insomnia: Secondary | ICD-10-CM

## 2018-08-07 NOTE — Telephone Encounter (Signed)
Last filled 07/05/2018... please advise  

## 2018-08-08 MED FILL — ZOLPIDEM TART ER 12.5 MG TA: 12.5 | 30 days supply | Qty: 30 | Fill #0

## 2018-09-03 ENCOUNTER — Other Ambulatory Visit: Payer: Self-pay

## 2018-09-03 ENCOUNTER — Ambulatory Visit (INDEPENDENT_AMBULATORY_CARE_PROVIDER_SITE_OTHER): Payer: Commercial Managed Care - PPO | Admitting: Internal Medicine

## 2018-09-03 DIAGNOSIS — Z91199 Patient's noncompliance with other medical treatment and regimen due to unspecified reason: Secondary | ICD-10-CM

## 2018-09-03 DIAGNOSIS — Z5329 Procedure and treatment not carried out because of patient's decision for other reasons: Secondary | ICD-10-CM

## 2018-09-03 NOTE — Progress Notes (Signed)
Unable to contact pt for Webex appt. Multiple phone attempts made. LVOM. Nicki Reaper, NP

## 2018-09-04 ENCOUNTER — Other Ambulatory Visit: Payer: Self-pay | Admitting: Internal Medicine

## 2018-09-04 DIAGNOSIS — F5101 Primary insomnia: Secondary | ICD-10-CM

## 2018-09-06 ENCOUNTER — Other Ambulatory Visit: Payer: Self-pay | Admitting: Internal Medicine

## 2018-09-06 DIAGNOSIS — F5101 Primary insomnia: Secondary | ICD-10-CM

## 2018-09-09 ENCOUNTER — Other Ambulatory Visit: Payer: Self-pay

## 2018-09-09 ENCOUNTER — Encounter: Payer: Self-pay | Admitting: Internal Medicine

## 2018-09-09 ENCOUNTER — Ambulatory Visit (INDEPENDENT_AMBULATORY_CARE_PROVIDER_SITE_OTHER): Payer: Commercial Managed Care - PPO | Admitting: Internal Medicine

## 2018-09-09 DIAGNOSIS — R51 Headache: Secondary | ICD-10-CM

## 2018-09-09 DIAGNOSIS — F39 Unspecified mood [affective] disorder: Secondary | ICD-10-CM | POA: Insufficient documentation

## 2018-09-09 DIAGNOSIS — D508 Other iron deficiency anemias: Secondary | ICD-10-CM

## 2018-09-09 DIAGNOSIS — R519 Headache, unspecified: Secondary | ICD-10-CM

## 2018-09-09 DIAGNOSIS — F5101 Primary insomnia: Secondary | ICD-10-CM | POA: Diagnosis not present

## 2018-09-09 DIAGNOSIS — J301 Allergic rhinitis due to pollen: Secondary | ICD-10-CM | POA: Diagnosis not present

## 2018-09-09 MED FILL — ZOLPIDEM TART ER 12.5 MG TA: 12.5 | 30 days supply | Qty: 30 | Fill #0

## 2018-09-09 NOTE — Assessment & Plan Note (Signed)
Allergy induced Continue Benadryl as needed

## 2018-09-09 NOTE — Assessment & Plan Note (Signed)
Will monitor CBC yearly 

## 2018-09-09 NOTE — Progress Notes (Signed)
Virtual Visit via Video Note  I connected with Crystal Morris on 09/09/18 at  3:30 PM EDT by a video enabled telemedicine application and verified that I am speaking with the correct person using two identifiers.   I discussed the limitations of evaluation and management by telemedicine and the availability of in person appointments. The patient expressed understanding and agreed to proceed.  History of Present Illness:  Pt reports watery eyes, runny nose, scratchy throat and cough. This started last week. She is blowing clear mucous out of her nose. The cough is non productive. She is mildly short of breath. She denies fever, chills but has had body aches. She thinks this was triggered by excessive use of Lysol wipes at work. She has tried Benadryl with good relief.  She also wants to follow up chronic conditions.  Frequent Headaches: Intermittent. Usually triggered by allergies. She takes Benadryl and Ibuprofen as needed with good relief.  Iron Deficiency Anemia: Her last H/H was 35.5/11.1, 03/2018. She is not taking any iron supplement OTC. She denies fatigue, chronic SOB ro s/s of bleeding.  Mood Disorder: Mostly irritability, menopause induced. She is taking Preempro and Escitalopram as prescribed. She would like these refilled today  Insomnia: She has trouble staying asleep. She takes Ambien CR as needed with good relief of symptoms.   Observations/Objective:  Alert and oriented x 3 NAD Bilateral eye tearing noted No obvious cough or SOB Behavior and thought content normal.   Assessment and Plan:  Allergic Rhinitis:  Continue Benadryl PRN  See problem based charting  Follow Up Instructions:    I discussed the assessment and treatment plan with the patient. The patient was provided an opportunity to ask questions and all were answered. The patient agreed with the plan and demonstrated an understanding of the instructions.   The patient was advised to call back or seek an  in-person evaluation if the symptoms worsen or if the condition fails to improve as anticipated.     Nicki Reaper, NP

## 2018-09-09 NOTE — Patient Instructions (Signed)

## 2018-09-09 NOTE — Assessment & Plan Note (Signed)
Stable on Ambien CR, refilled today Will monitor

## 2018-09-09 NOTE — Assessment & Plan Note (Signed)
Triggered by menopause Stable on Lexapro, refilled today Support offered

## 2018-09-10 MED FILL — ESCITALOPRAM 20 MG TABLET: 20 | 90 days supply | Qty: 90 | Fill #0

## 2018-09-10 MED FILL — PREMPRO 0.3 MG-1.5 MG TAB: 0.3-1.5 | 84 days supply | Qty: 84 | Fill #0

## 2018-09-26 MED FILL — IPRATROPIUM 0.03% SPRAY: 0.03 | 30 days supply | Qty: 30 | Fill #0

## 2018-09-26 MED FILL — LEVOCETIRIZINE 5 MG TABLET: 5 | 30 days supply | Qty: 30 | Fill #0

## 2018-09-26 MED FILL — AZELASTINE HCL 0.05% DROPS: 0.05 | 30 days supply | Qty: 6 | Fill #0

## 2018-10-04 DIAGNOSIS — Z1159 Encounter for screening for other viral diseases: Secondary | ICD-10-CM | POA: Diagnosis not present

## 2018-10-08 ENCOUNTER — Other Ambulatory Visit: Payer: Self-pay | Admitting: Internal Medicine

## 2018-10-08 DIAGNOSIS — F5101 Primary insomnia: Secondary | ICD-10-CM

## 2018-10-08 NOTE — Telephone Encounter (Signed)
Last filled 09/09/2018... please advise 

## 2018-10-09 MED FILL — ZOLPIDEM TART ER 12.5 MG TA: 12.5 | 30 days supply | Qty: 30 | Fill #0

## 2018-10-31 ENCOUNTER — Other Ambulatory Visit: Payer: Self-pay | Admitting: Internal Medicine

## 2018-10-31 DIAGNOSIS — F5101 Primary insomnia: Secondary | ICD-10-CM

## 2018-11-01 ENCOUNTER — Inpatient Hospital Stay: Admission: RE | Admit: 2018-11-01 | Payer: Commercial Managed Care - PPO | Source: Ambulatory Visit

## 2018-11-01 ENCOUNTER — Other Ambulatory Visit: Payer: Self-pay | Admitting: Internal Medicine

## 2018-11-01 DIAGNOSIS — F5101 Primary insomnia: Secondary | ICD-10-CM

## 2018-11-01 NOTE — Telephone Encounter (Signed)
Not due until next week.

## 2018-11-05 MED ORDER — ZOLPIDEM TARTRATE ER 12.5 MG PO TBCR: 12.5000 mg | EXTENDED_RELEASE_TABLET | Freq: Every day | ORAL | 0 refills | Status: DC

## 2018-11-05 NOTE — Telephone Encounter (Signed)
Last filled 10/09/2018... note to pharmacy [facility] on or after 11/08/2018

## 2018-11-08 MED FILL — ZOLPIDEM TART ER 12.5 MG TA: 12.5 | 30 days supply | Qty: 30 | Fill #0

## 2018-11-30 ENCOUNTER — Other Ambulatory Visit: Payer: Self-pay | Admitting: Internal Medicine

## 2018-11-30 DIAGNOSIS — F5101 Primary insomnia: Secondary | ICD-10-CM

## 2018-11-30 MED FILL — PREMPRO 0.3 MG-1.5 MG TAB: 0.3-1.5 | 84 days supply | Qty: 84 | Fill #1

## 2018-12-01 NOTE — Telephone Encounter (Signed)
Last filled 11/08/2018... note to pharmacy [facility] on or after 12/08/2018.Marland KitchenMarland KitchenMarland Kitchen please advise

## 2018-12-02 MED FILL — ZOLPIDEM TART ER 12.5 MG TA: 12.5 | 30 days supply | Qty: 30 | Fill #0

## 2018-12-03 ENCOUNTER — Other Ambulatory Visit: Payer: Self-pay | Admitting: Internal Medicine

## 2018-12-03 DIAGNOSIS — F5101 Primary insomnia: Secondary | ICD-10-CM

## 2018-12-03 MED FILL — ESCITALOPRAM 20 MG TABLET: 20 | 90 days supply | Qty: 90 | Fill #1

## 2019-01-06 ENCOUNTER — Other Ambulatory Visit: Payer: Self-pay | Admitting: Internal Medicine

## 2019-01-06 DIAGNOSIS — F5101 Primary insomnia: Secondary | ICD-10-CM

## 2019-01-06 NOTE — Telephone Encounter (Signed)
Last filled 12/08/2018, to be filled on 01/07/2019.Marland KitchenMarland KitchenMarland Kitchen please advise

## 2019-01-07 MED ORDER — ZOLPIDEM TARTRATE ER 12.5 MG PO TBCR: 12.5000 mg | EXTENDED_RELEASE_TABLET | Freq: Every day | ORAL | 0 refills | Status: DC

## 2019-01-07 MED FILL — ZOLPIDEM TART ER 12.5 MG TA: 12.5 | 30 days supply | Qty: 30 | Fill #0

## 2019-01-07 NOTE — Addendum Note (Signed)
Addended by: Jearld Fenton on: 01/07/2019 10:47 AM   Modules accepted: Orders

## 2019-02-15 ENCOUNTER — Other Ambulatory Visit: Payer: Self-pay | Admitting: Internal Medicine

## 2019-02-15 DIAGNOSIS — F5101 Primary insomnia: Secondary | ICD-10-CM

## 2019-02-17 MED FILL — ZOLPIDEM TART ER 12.5 MG TA: 12.5 | 30 days supply | Qty: 30 | Fill #0

## 2019-02-17 NOTE — Telephone Encounter (Signed)
Last filled 01-07-19 #30 Last OV 09-09-18 No Future OV [facility]  Forwarding to Dr Silvio Pate in [place] absence

## 2019-02-28 ENCOUNTER — Other Ambulatory Visit: Payer: Self-pay | Admitting: Internal Medicine

## 2019-03-03 ENCOUNTER — Other Ambulatory Visit: Payer: Self-pay | Admitting: Internal Medicine

## 2019-03-05 MED FILL — ESCITALOPRAM 20 MG TABLET: 20 | 90 days supply | Qty: 90 | Fill #0

## 2019-03-10 ENCOUNTER — Telehealth: Payer: No Typology Code available for payment source | Admitting: Nurse Practitioner

## 2019-03-10 ENCOUNTER — Telehealth: Payer: Self-pay | Admitting: Internal Medicine

## 2019-03-10 DIAGNOSIS — J301 Allergic rhinitis due to pollen: Secondary | ICD-10-CM | POA: Diagnosis not present

## 2019-03-10 DIAGNOSIS — D508 Other iron deficiency anemias: Secondary | ICD-10-CM

## 2019-03-10 MED ORDER — FLUTICASONE PROPIONATE 50 MCG/ACT NA SUSP: 2.0000 | Freq: Every day | NASAL | 6 refills | Status: DC

## 2019-03-10 MED FILL — FLUTICASONE PROP 50 MCG SPR: 50 | 30 days supply | Qty: 16 | Fill #0

## 2019-03-10 NOTE — Telephone Encounter (Signed)
Best number [phone] Pt called stating she had lab work done @ bariatric clinic and her hemoglobin was 7 and iron was 18. She stated they told her she need an iron infusion.  She wanted Rollene Fare do referral to [facility] so she can get iron infusion.  This is where she has had this done before   She stated she has gain 30lb

## 2019-03-10 NOTE — Progress Notes (Signed)
E visit for Allergic Rhinitis We are sorry that you are not feeling well.  Here is how we plan to help!  Based on what you have shared with me it looks like you have Allergic Rhinitis.  Rhinitis is when a reaction occurs that causes nasal congestion, runny nose, sneezing, and itching.  Most types of rhinitis are caused by an inflammation and are associated with symptoms in the eyes ears or throat. There are several types of rhinitis.  The most common are acute rhinitis, which is usually caused by a viral illness, allergic or seasonal rhinitis, and nonallergic or year-round rhinitis.  Nasal allergies occur certain times of the year.  Allergic rhinitis is caused when allergens in the air trigger the release of histamine in the body.  Histamine causes itching, swelling, and fluid to build up in the fragile linings of the nasal passages, sinuses and eyelids.  An itchy nose and clear discharge are common.  I recommend the following over the counter treatments: Clarinen 5 mg take 1 tablet daily OTC   I also would recommend a nasal spray: prescription sent to pharmacy Flonase 2 sprays into each nostril once daily   HOME CARE:   You can use an over-the-counter saline nasal spray as needed  Avoid areas where there is heavy dust, mites, or molds  Stay indoors on windy days during the pollen season  Keep windows closed in home, at least in bedroom; use air conditioner.  Use high-efficiency house air filter  Keep windows closed in car, turn AC on re-circulate  Avoid playing out with dog during pollen season  GET HELP RIGHT AWAY IF:   If your symptoms do not improve within 10 days  You become short of breath  You develop yellow or green discharge from your nose for over 3 days  You have coughing fits  MAKE SURE YOU:   Understand these instructions  Will watch your condition  Will get help right away if you are not doing well or get worse  Thank you for choosing an e-visit. Your  e-visit answers were reviewed by a board certified advanced clinical practitioner to complete your personal care plan. Depending upon the condition, your plan could have included both over the counter or prescription medications. Please review your pharmacy choice. Be sure that the pharmacy you have chosen is open so that you can pick up your prescription now.  If there is a problem you may message your provider in [place] to have the prescription routed to another pharmacy. Your safety is important to [place]. If you have drug allergies check your prescription carefully.  For the next 24 hours, you can use MyChart to ask questions about today's visit, request a non-urgent call back, or ask for a work or school excuse from your e-visit provider. You will get an email in the next two days asking about your experience. I hope that your e-visit has been valuable and will speed your recovery.   5-10 minutes spent reviewing and documenting in chart.

## 2019-03-10 NOTE — Addendum Note (Signed)
Addended by: Jearld Fenton on: 03/10/2019 03:24 PM   Modules accepted: Orders

## 2019-03-11 ENCOUNTER — Other Ambulatory Visit: Payer: Self-pay | Admitting: Oncology

## 2019-03-11 DIAGNOSIS — D508 Other iron deficiency anemias: Secondary | ICD-10-CM

## 2019-03-13 ENCOUNTER — Telehealth: Payer: Self-pay | Admitting: Oncology

## 2019-03-13 NOTE — Telephone Encounter (Signed)
Scheduled patient per request, patient's husband is notified of date and time.

## 2019-03-14 ENCOUNTER — Telehealth: Payer: Self-pay | Admitting: Oncology

## 2019-03-14 NOTE — Telephone Encounter (Signed)
Patient is an established patient, informed patient we moved the appointment to 10/21.

## 2019-03-15 ENCOUNTER — Emergency Department (HOSPITAL_COMMUNITY)
Admission: EM | Admit: 2019-03-15 | Discharge: 2019-03-15 | Disposition: A | Discharge status: Home / Self Care | Payer: No Typology Code available for payment source | Attending: Emergency Medicine | Admitting: Emergency Medicine

## 2019-03-15 ENCOUNTER — Encounter (HOSPITAL_COMMUNITY): Payer: Self-pay

## 2019-03-15 ENCOUNTER — Other Ambulatory Visit: Payer: Self-pay

## 2019-03-15 DIAGNOSIS — K9589 Other complications of other bariatric procedure: Secondary | ICD-10-CM

## 2019-03-15 DIAGNOSIS — D509 Iron deficiency anemia, unspecified: Secondary | ICD-10-CM

## 2019-03-15 DIAGNOSIS — D508 Other iron deficiency anemias: Secondary | ICD-10-CM | POA: Insufficient documentation

## 2019-03-15 DIAGNOSIS — R790 Abnormal level of blood mineral: Secondary | ICD-10-CM | POA: Diagnosis present

## 2019-03-15 LAB — CBC
HCT: 26.9 % — ABNORMAL LOW (ref 36.0–46.0)
Hemoglobin: 7.5 g/dL — ABNORMAL LOW (ref 12.0–15.0)
MCH: 19.8 pg — ABNORMAL LOW (ref 26.0–34.0)
MCHC: 27.9 g/dL — ABNORMAL LOW (ref 30.0–36.0)
MCV: 71.2 fL — ABNORMAL LOW (ref 80.0–100.0)
Platelets: 530 10*3/uL — ABNORMAL HIGH (ref 150–400)
RBC: 3.78 MIL/uL — ABNORMAL LOW (ref 3.87–5.11)
RDW: 20 % — ABNORMAL HIGH (ref 11.5–15.5)
WBC: 6.9 10*3/uL (ref 4.0–10.5)
nRBC: 0.3 % — ABNORMAL HIGH (ref 0.0–0.2)

## 2019-03-15 LAB — IRON: Iron: 22 ug/dL — ABNORMAL LOW (ref 28–170)

## 2019-03-15 LAB — TYPE AND SCREEN
ABO/RH(D): O POS
Antibody Screen: NEGATIVE

## 2019-03-15 LAB — FERRITIN: Ferritin: 4 ng/mL — ABNORMAL LOW (ref 11–307)

## 2019-03-15 MED ORDER — SODIUM CHLORIDE 0.9 % IV SOLN
510.0000 mg | Freq: Once | INTRAVENOUS | Status: AC
  Administered 2019-03-15: 510 mg via INTRAVENOUS
  Filled 2019-03-15: qty 17

## 2019-03-15 NOTE — ED Triage Notes (Addendum)
Pt presents with c/o abnormal labs. Pt reports her hemoglobin is 7.1 and her iron was 18 after having labs drawn earlier this week and was told today to come to the ER for an infusion.

## 2019-03-15 NOTE — Discharge Instructions (Addendum)
Your hemoglobin was low when you presented today but stable from your lab draw earlier this week.  You did not need any blood transfusion but we did provide an iron infusion as suggested by your physician. If you experience any dizziness, loss of consciousness, changes in vision, fast rapid heartbeat, shortness of breath, please seek care and contact your PCP.  You asked for a copy of your labs prior to discharge, however you will have to obtain this from medical records or sign up for MyChart which is an app on your phone that you can speak to your doctors over, obtain your labs, and see any of your recent encounters.

## 2019-03-15 NOTE — ED Provider Notes (Signed)
[facility] DEPT Provider Note   CSN: [id number] Arrival date & time: 03/15/19  1314     History   Chief Complaint Chief Complaint  Patient presents with  . Abnormal Lab    HPI Crystal Morris is a 53 y.o. female with past medical history significant for iron deficiency anemia requiring multiple iron infusions, headaches, mood disorder who presents today for iron infusion after having abnormal labs earlier this week.     Past Medical History:  Diagnosis Date  . History of chicken pox   . Medical history non-contributory   . Wears glasses     Patient Active Problem List   Diagnosis Date Noted  . Mood disorder ([place]) 09/09/2018  . Primary insomnia 10/18/2017  . Frequent headaches 10/18/2017  . Iron deficiency anemia 09/09/2014    Past Surgical History:  Procedure Laterality Date  . CESAREAN SECTION    . CLOSED REDUCTION METACARPAL WITH PERCUTANEOUS PINNING Left 07/28/2014   Procedure: CLOSED REDUCTION METACARPAL WITH PERCUTANEOUS PINNING PROXIMAL PHALANX FRACTURES LEFT MIDDLE RING AND SMALL FINGERS;  Surgeon: Leanora Cover, MD;  Location: [facility];  Service: Orthopedics;  Laterality: Left;  . HEMORROIDECTOMY    . INTRAUTERINE DEVICE INSERTION       OB History   No obstetric history on file.      Home Medications    Prior to Admission medications   Medication Sig Start Date End Date Taking? Authorizing Provider  aspirin-acetaminophen-caffeine (EXCEDRIN MIGRAINE) [phone] MG per tablet Take 1 tablet by mouth every 6 (six) hours as needed for headache.   Yes [provider]  diphenhydrAMINE (BENADRYL) 25 mg capsule Take 25 mg by mouth at bedtime as needed for itching, allergies or sleep.   Yes [provider]  escitalopram (LEXAPRO) 20 MG tablet Take 1 tablet (20 mg total) by mouth daily. MUST SCHEDULE PHYSICAL EXAM 03/04/19  Yes Baity, Coralie Keens, NP  fluticasone (FLONASE) 50 MCG/ACT nasal spray Place 2  sprays into both nostrils daily. 03/10/19  Yes Hassell Done, Mary-Margaret, FNP  Polyethyl Glycol-Propyl Glycol (SYSTANE OP) Place 1 drop into both eyes at bedtime.   Yes [provider]  PREMPRO 0.3-1.5 MG tablet Take 1 tablet by mouth daily. MUST SCHEDULE PHYSICAL 03/04/19  Yes Jearld Fenton, NP  zolpidem (AMBIEN CR) 12.5 MG CR tablet TAKE 1 TABLET (12.5 MG TOTAL) BY MOUTH AT BEDTIME. 02/17/19  Yes Venia Carbon, MD    Family History History reviewed. No pertinent family history.  Social History Social History   Tobacco Use  . Smoking status: Never Smoker  . Smokeless tobacco: Never Used  Substance Use Topics  . Alcohol use: No  . Drug use: No     Allergies   Oxycodone   Review of Systems Review of Systems  Constitutional: Negative for activity change, fatigue and fever.  HENT: Negative for congestion.   Eyes: Negative for visual disturbance.  Respiratory: Negative for shortness of breath.   Cardiovascular: Negative for chest pain.  Gastrointestinal: Negative for abdominal distention, anal bleeding, blood in stool, nausea and vomiting.  Genitourinary: Negative for dysuria.  Musculoskeletal: Negative for myalgias.  Neurological: Negative for dizziness and light-headedness.     Physical Exam Updated Vital Signs BP (!) 133/99 (BP Location: Right Arm)   Pulse 90   Temp 98.6 F (37 C) (Oral)   Resp 18   SpO2 100%   Physical Exam Constitutional:      General: She is not in acute distress.  Appearance: Normal appearance. She is not ill-appearing.  HENT:     Nose: Nose normal.     Mouth/Throat:     Mouth: Mucous membranes are moist.     Pharynx: Oropharynx is clear.  Eyes:     Extraocular Movements: Extraocular movements intact.     Comments: Conjunctival pallor  Neck:     Musculoskeletal: No neck rigidity.  Cardiovascular:     Rate and Rhythm: Normal rate and regular rhythm.     Pulses: Normal pulses.  Abdominal:     General: Bowel sounds are  normal.     Palpations: Abdomen is soft.  Musculoskeletal:        General: No swelling or tenderness.  Skin:    General: Skin is warm.     Capillary Refill: Capillary refill takes less than 2 seconds.     Coloration: Skin is not pale.  Neurological:     General: No focal deficit present.     Mental Status: She is alert and oriented to person, place, and time. Mental status is at baseline.      ED Treatments / Results  Labs (all labs ordered are listed, but only abnormal results are displayed) Labs Reviewed  CBC - Abnormal; Notable for the following components:      Result Value   RBC 3.78 (*)    Hemoglobin 7.5 (*)    HCT 26.9 (*)    MCV 71.2 (*)    MCH 19.8 (*)    MCHC 27.9 (*)    RDW 20.0 (*)    Platelets 530 (*)    nRBC 0.3 (*)    All other components within normal limits  IRON - Abnormal; Notable for the following components:   Iron 22 (*)    All other components within normal limits  FERRITIN - Abnormal; Notable for the following components:   Ferritin 4 (*)    All other components within normal limits  TYPE AND SCREEN  ABO/RH   EKG None  Radiology No results found.  Procedures Procedures (including critical care time)  Medications Ordered in ED Medications  ferumoxytol (FERAHEME) 510 mg in sodium chloride 0.9 % 100 mL IVPB (0 mg Intravenous Stopped 03/15/19 1945)     Initial Impression / Assessment and Plan / ED Course  I have reviewed the triage vital signs and the nursing notes.  Pertinent labs & imaging results that were available during my care of the patient were reviewed by me and considered in my medical decision making (see chart for details). Patient presenting with instructions to obtain iron infusion from her doctor after having abnormal labs earlier this week.  Patient has [facility] results that showed hemoglobin of 7.1, hematocrit 25.2, MCV 71, platelets 572.  Will obtain confirmatory labs, CBC, iron, ferritin, type and screen.  Hemoglobin  7.5 on recheck in the ED.  We will start Feraheme infusion x1.  Tolerated infusion well.  Patient is requesting copy of her labs as well as work note.   Final Clinical Impressions(s) / ED Diagnoses   Final diagnoses:  Iron deficiency anemia following bariatric surgery   Discharge home with husband ED Discharge Orders    None       Melene Plan, MD 03/15/19 2144    Maia Plan, MD 03/16/19 1200

## 2019-03-17 ENCOUNTER — Other Ambulatory Visit: Payer: Self-pay | Admitting: Internal Medicine

## 2019-03-17 DIAGNOSIS — F5101 Primary insomnia: Secondary | ICD-10-CM

## 2019-03-17 LAB — ABO/RH: ABO/RH(D): O POS

## 2019-03-18 NOTE — Telephone Encounter (Signed)
Last filled 02/17/2019... please advise  

## 2019-03-19 MED FILL — ZOLPIDEM TART ER 12.5 MG TA: 12.5 | 30 days supply | Qty: 30 | Fill #0

## 2019-03-19 NOTE — Telephone Encounter (Signed)
Needs to schedule annual exam

## 2019-03-25 ENCOUNTER — Other Ambulatory Visit: Payer: Commercial Managed Care - PPO

## 2019-03-25 ENCOUNTER — Ambulatory Visit: Payer: Commercial Managed Care - PPO | Admitting: Oncology

## 2019-03-26 ENCOUNTER — Other Ambulatory Visit: Payer: Self-pay

## 2019-03-26 ENCOUNTER — Inpatient Hospital Stay: Payer: No Typology Code available for payment source | Attending: Oncology

## 2019-03-26 ENCOUNTER — Inpatient Hospital Stay (HOSPITAL_BASED_OUTPATIENT_CLINIC_OR_DEPARTMENT_OTHER): Payer: No Typology Code available for payment source | Admitting: Oncology

## 2019-03-26 ENCOUNTER — Telehealth: Payer: Self-pay | Admitting: Oncology

## 2019-03-26 VITALS — BP 140/86 | HR 94 | Temp 98.0°F | Resp 18 | Ht 61.0 in | Wt 155.7 lb

## 2019-03-26 DIAGNOSIS — D508 Other iron deficiency anemias: Secondary | ICD-10-CM | POA: Diagnosis not present

## 2019-03-26 DIAGNOSIS — D509 Iron deficiency anemia, unspecified: Secondary | ICD-10-CM | POA: Insufficient documentation

## 2019-03-26 DIAGNOSIS — Z79899 Other long term (current) drug therapy: Secondary | ICD-10-CM | POA: Insufficient documentation

## 2019-03-26 DIAGNOSIS — Z7982 Long term (current) use of aspirin: Secondary | ICD-10-CM | POA: Insufficient documentation

## 2019-03-26 LAB — CBC WITH DIFFERENTIAL (CANCER CENTER ONLY)
Abs Immature Granulocytes: 0.04 10*3/uL (ref 0.00–0.07)
Basophils Absolute: 0 10*3/uL (ref 0.0–0.1)
Basophils Relative: 0 %
Eosinophils Absolute: 0.1 10*3/uL (ref 0.0–0.5)
Eosinophils Relative: 1 %
HCT: 30.3 % — ABNORMAL LOW (ref 36.0–46.0)
Hemoglobin: 8.6 g/dL — ABNORMAL LOW (ref 12.0–15.0)
Immature Granulocytes: 1 %
Lymphocytes Relative: 29 %
Lymphs Abs: 1.7 10*3/uL (ref 0.7–4.0)
MCH: 21.3 pg — ABNORMAL LOW (ref 26.0–34.0)
MCHC: 28.4 g/dL — ABNORMAL LOW (ref 30.0–36.0)
MCV: 75.2 fL — ABNORMAL LOW (ref 80.0–100.0)
Monocytes Absolute: 0.5 10*3/uL (ref 0.1–1.0)
Monocytes Relative: 9 %
Neutro Abs: 3.5 10*3/uL (ref 1.7–7.7)
Neutrophils Relative %: 60 %
Platelet Count: 435 10*3/uL — ABNORMAL HIGH (ref 150–400)
RBC: 4.03 MIL/uL (ref 3.87–5.11)
RDW: 26.6 % — ABNORMAL HIGH (ref 11.5–15.5)
WBC Count: 5.9 10*3/uL (ref 4.0–10.5)
nRBC: 0 % (ref 0.0–0.2)

## 2019-03-26 LAB — FERRITIN: Ferritin: 714 ng/mL — ABNORMAL HIGH (ref 11–307)

## 2019-03-26 LAB — IRON AND TIBC
Iron: 75 ug/dL (ref 41–142)
Saturation Ratios: 14 % — ABNORMAL LOW (ref 21–57)
TIBC: 554 ug/dL — ABNORMAL HIGH (ref 236–444)
UIBC: 478 ug/dL — ABNORMAL HIGH (ref 120–384)

## 2019-03-26 NOTE — Progress Notes (Signed)
Hematology and Oncology Follow Up Visit  Crystal Morris [id number] 08-06-1965 53 y.o. 03/26/2019 8:27 AM  Principle Diagnosis: 53 year old woman with Iron deficiency anemia noted in 2016.  Her anemia is related to chronic menstrual blood losses and poor iron absorption.   Prior Therapy: Feraheme at total of 1 g completed on 09/23/2014. She received 510 mg on 01/14/2015.  Current therapy: Active surveillance.  Interim History: Ms. Vokes returns today for a follow-up visit.  She is a pleasant woman with iron deficiency anemia that was last treated in 2016 with intravenous iron.  Her hemoglobin back in October 2019 was close to normal range of 11.1.  She has started developing worsening fatigue and found to have hemoglobin of 7.5 on March 15, 2019.  Iron studies at that time showed a iron level of 22 and ferritin of 4.  She was seen in the emergency department on March 15, 2019 and was given Feraheme 510 mg at that time.  She has felt slightly better since that time although she does report some occasional fatigue and tiredness and occasional dyspnea.  She denies any hematochezia, melena or hemoptysis.  She denies any epistaxis or any signs or symptoms of bleeding.  Her appetite and performance status remains unchanged.   Patient denied any alteration mental status, neuropathy, confusion or dizziness.  Denies any headaches or lethargy.  Denies any night sweats, weight loss or changes in appetite.  Denied orthopnea, dyspnea on exertion or chest discomfort.  Denies shortness of breath, difficulty breathing hemoptysis or cough.  Denies any abdominal distention, nausea, early satiety or dyspepsia.  Denies any hematuria, frequency, dysuria or nocturia.  Denies any skin irritation, dryness or rash.  Denies any ecchymosis or petechiae.  Denies any lymphadenopathy or clotting.  Denies any heat or cold intolerance.  Denies any anxiety or depression.  Remaining review of system is  negative.       Medications: Updated on review. Current Outpatient Medications  Medication Sig Dispense Refill  . aspirin-acetaminophen-caffeine (EXCEDRIN MIGRAINE) 250-250-65 MG per tablet Take 1 tablet by mouth every 6 (six) hours as needed for headache.    . diphenhydrAMINE (BENADRYL) 25 mg capsule Take 25 mg by mouth at bedtime as needed for itching, allergies or sleep.    Marland Kitchen escitalopram (LEXAPRO) 20 MG tablet Take 1 tablet (20 mg total) by mouth daily. MUST SCHEDULE PHYSICAL EXAM 90 tablet 0  . fluticasone (FLONASE) 50 MCG/ACT nasal spray Place 2 sprays into both nostrils daily. 16 g 6  . Polyethyl Glycol-Propyl Glycol (SYSTANE OP) Place 1 drop into both eyes at bedtime.    Marland Kitchen PREMPRO 0.3-1.5 MG tablet Take 1 tablet by mouth daily. MUST SCHEDULE PHYSICAL 84 tablet 0  . zolpidem (AMBIEN CR) 12.5 MG CR tablet TAKE 1 TABLET BY MOUTH AT BEDTIME 30 tablet 0   No current facility-administered medications for this visit.      Allergies:  Allergies  Allergen Reactions  . Oxycodone Itching    Past Medical History, Surgical history, Social history, and Family History without any changes on review.   Physical Exam: Blood pressure 140/86, pulse 94, temperature 98 F (36.7 C), temperature source Oral, resp. rate 18, height 5\' 1"  (1.549 m), weight 155 lb 11.2 oz (70.6 kg), SpO2 100 %.   ECOG: 0   General appearance: Comfortable appearing without any discomfort Head: Normocephalic without any trauma Oropharynx: Mucous membranes are moist and pink without any thrush or ulcers. Eyes: Pupils are equal and round reactive to light. Lymph nodes: No  cervical, supraclavicular, inguinal or axillary lymphadenopathy.   Heart:regular rate and rhythm.  S1 and S2 without leg edema. Lung: Clear without any rhonchi or wheezes.  No dullness to percussion. Abdomin: Soft, nontender, nondistended with good bowel sounds.  No hepatosplenomegaly. Musculoskeletal: No joint deformity or effusion.  Full  range of motion noted. Neurological: No deficits noted on motor, sensory and deep tendon reflex exam. Skin: No petechial rash or dryness.  Appeared moist.           Lab Results: Lab Results  Component Value Date   WBC 6.9 03/15/2019   HGB 7.5 (L) 03/15/2019   HCT 26.9 (L) 03/15/2019   MCV 71.2 (L) 03/15/2019   PLT 530 (H) 03/15/2019     Chemistry      Component Value Date/Time   NA 138 04/03/2018 1442   NA 138 03/23/2015 0808   K 3.7 04/03/2018 1442   K 4.9 03/23/2015 0808   CL 102 04/03/2018 1442   CO2 31 04/03/2018 1442   CO2 25 03/23/2015 0808   BUN 15 04/03/2018 1442   BUN 19.1 03/23/2015 0808   CREATININE 0.98 04/03/2018 1442   CREATININE 1.0 03/23/2015 0808      Component Value Date/Time   CALCIUM 8.9 04/03/2018 1442   CALCIUM 8.7 03/23/2015 0808   ALKPHOS 38 (L) 04/03/2018 1442   ALKPHOS 48 03/23/2015 0808   AST 18 04/03/2018 1442   AST 26 03/23/2015 0808   ALT 11 04/03/2018 1442   ALT 31 03/23/2015 0808   BILITOT 0.4 04/03/2018 1442   BILITOT <0.30 03/23/2015 0808       Impression and Plan:  53 year old woman with the following issues:  1. Iron deficiency anemia dating back to 2016.  He status post intravenous iron on separate occasions the last one was given in 2016.  He developed a worsening iron deficiency in October 2020 and received 510 mg of Feraheme.  The etiology of her iron deficiency remains completely unclear.  She does not have any menstrual bleeding at this time and denies any GI bleeding.  She reports that she had a colonoscopy in the past although it is unclear to me when with that.  She never had any bariatric surgery but she could have issues with oral iron absorption.  Risks and benefits of repeat intravenous iron was discussed today.  He will receive another 510 mg of Feraheme given her severe deficiency and we will repeat iron studies and 2 to 3 months.  2. Fatigue and tiredness: Related to iron deficiency and should improve  after correcting her anemia. Appears to be improving slightly.  3. Colon cancer screening: It is unclear whether she is up-to-date at this time.  I continue to urge her to have another colonoscopy if she is not up-to-date.  4.  Follow-up: Will be in 3 months for repeat evaluation.  15  minutes was spent with the patient face-to-face today.  More than 50% of time was spent on reviewing laboratory data, treatment options and answering questions regarding future plan of care.      Eli Hose, MD 10/21/20208:27 AM

## 2019-03-26 NOTE — Telephone Encounter (Signed)
Scheduled appt per 10/21 sch message - pt husband is aware of appt date and time

## 2019-03-28 ENCOUNTER — Inpatient Hospital Stay: Payer: No Typology Code available for payment source

## 2019-03-28 ENCOUNTER — Emergency Department (HOSPITAL_COMMUNITY): Payer: No Typology Code available for payment source

## 2019-03-28 ENCOUNTER — Encounter (HOSPITAL_COMMUNITY): Payer: Self-pay | Admitting: Emergency Medicine

## 2019-03-28 ENCOUNTER — Emergency Department (HOSPITAL_COMMUNITY)
Admission: EM | Admit: 2019-03-28 | Discharge: 2019-03-29 | Disposition: A | Discharge status: Home / Self Care | Payer: No Typology Code available for payment source | Attending: Emergency Medicine | Admitting: Emergency Medicine

## 2019-03-28 ENCOUNTER — Other Ambulatory Visit: Payer: Self-pay

## 2019-03-28 VITALS — BP 146/95 | HR 82 | Temp 98.6°F | Resp 18

## 2019-03-28 DIAGNOSIS — R1011 Right upper quadrant pain: Secondary | ICD-10-CM | POA: Diagnosis present

## 2019-03-28 DIAGNOSIS — D508 Other iron deficiency anemias: Secondary | ICD-10-CM

## 2019-03-28 DIAGNOSIS — K219 Gastro-esophageal reflux disease without esophagitis: Secondary | ICD-10-CM

## 2019-03-28 LAB — COMPREHENSIVE METABOLIC PANEL
ALT: 17 U/L (ref 0–44)
AST: 24 U/L (ref 15–41)
Albumin: 3.9 g/dL (ref 3.5–5.0)
Alkaline Phosphatase: 56 U/L (ref 38–126)
Anion gap: 11 (ref 5–15)
BUN: 14 mg/dL (ref 6–20)
CO2: 24 mmol/L (ref 22–32)
Calcium: 9.5 mg/dL (ref 8.9–10.3)
Chloride: 101 mmol/L (ref 98–111)
Creatinine, Ser: 1.2 mg/dL — ABNORMAL HIGH (ref 0.44–1.00)
GFR calc Af Amer: 59 mL/min — ABNORMAL LOW (ref >60)
GFR calc non Af Amer: 51 mL/min — ABNORMAL LOW (ref >60)
Glucose, Bld: 108 mg/dL — ABNORMAL HIGH (ref 70–99)
Potassium: 3.6 mmol/L (ref 3.5–5.1)
Sodium: 136 mmol/L (ref 135–145)
Total Bilirubin: 0.6 mg/dL (ref 0.3–1.2)
Total Protein: 7.9 g/dL (ref 6.5–8.1)

## 2019-03-28 LAB — I-STAT BETA HCG BLOOD, ED (MC, WL, AP ONLY): I-stat hCG, quantitative: <5 m[IU]/mL (ref <5)

## 2019-03-28 LAB — CBC
HCT: 33.7 % — ABNORMAL LOW (ref 36.0–46.0)
Hemoglobin: 9.6 g/dL — ABNORMAL LOW (ref 12.0–15.0)
MCH: 22 pg — ABNORMAL LOW (ref 26.0–34.0)
MCHC: 28.5 g/dL — ABNORMAL LOW (ref 30.0–36.0)
MCV: 77.3 fL — ABNORMAL LOW (ref 80.0–100.0)
Platelets: 410 10*3/uL — ABNORMAL HIGH (ref 150–400)
RBC: 4.36 MIL/uL (ref 3.87–5.11)
RDW: 27.5 % — ABNORMAL HIGH (ref 11.5–15.5)
WBC: 6.4 10*3/uL (ref 4.0–10.5)
nRBC: 0 % (ref 0.0–0.2)

## 2019-03-28 LAB — LIPASE, BLOOD: Lipase: 33 U/L (ref 11–51)

## 2019-03-28 LAB — URINALYSIS, ROUTINE W REFLEX MICROSCOPIC
Bilirubin Urine: NEGATIVE
Glucose, UA: NEGATIVE mg/dL
Hgb urine dipstick: NEGATIVE
Ketones, ur: NEGATIVE mg/dL
Leukocytes,Ua: NEGATIVE
Nitrite: NEGATIVE
Protein, ur: NEGATIVE mg/dL
Specific Gravity, Urine: 1.014 (ref 1.005–1.030)
pH: 5 (ref 5.0–8.0)

## 2019-03-28 MED ORDER — SODIUM CHLORIDE 0.9% FLUSH: 3.0000 mL | Freq: Once | INTRAVENOUS | Status: DC

## 2019-03-28 MED ORDER — ALUM & MAG HYDROXIDE-SIMETH 200-200-20 MG/5ML PO SUSP
30.0000 mL | Freq: Once | ORAL | Status: AC
  Administered 2019-03-28: 23:00:00 30 mL via ORAL
  Filled 2019-03-28: qty 30

## 2019-03-28 MED ORDER — SODIUM CHLORIDE 0.9 % IV BOLUS
1000.0000 mL | Freq: Once | INTRAVENOUS | Status: AC
  Administered 2019-03-28: 1000 mL via INTRAVENOUS

## 2019-03-28 MED ORDER — LIDOCAINE VISCOUS HCL 2 % MT SOLN
15.0000 mL | Freq: Once | OROMUCOSAL | Status: AC
  Administered 2019-03-28: 15 mL via ORAL
  Filled 2019-03-28: qty 15

## 2019-03-28 MED ORDER — SODIUM CHLORIDE 0.9 % IV SOLN
Freq: Once | INTRAVENOUS | Status: AC
  Administered 2019-03-28: 10:00:00 via INTRAVENOUS
  Filled 2019-03-28: qty 250

## 2019-03-28 MED ORDER — SODIUM CHLORIDE 0.9 % IV SOLN
510.0000 mg | Freq: Once | INTRAVENOUS | Status: AC
  Administered 2019-03-28: 510 mg via INTRAVENOUS
  Filled 2019-03-28: qty 510

## 2019-03-28 MED ORDER — ONDANSETRON 4 MG PO TBDP
4.0000 mg | ORAL_TABLET | Freq: Once | ORAL | Status: AC
  Administered 2019-03-28: 4 mg via ORAL
  Filled 2019-03-28: qty 1

## 2019-03-28 NOTE — ED Provider Notes (Signed)
[facility]-EMERGENCY DEPT Provider Note   CSN: [id number] Arrival date & time: 03/28/19  1753     History   Chief Complaint Chief Complaint  Patient presents with  . Abdominal Pain  . Nausea    HPI Crystal Morris is a 53 y.o. female with a past medical history significant for chronic iron deficiency anemia who had a Feraheme infusion on 03/15/2019 who presents to the ED due to intermittent RUQ pain x 1 month. Patient notes RUQ pain is associated with reflux symptoms and nausea and typically worse after meals. Patient notes she has had a few episodes of non-bloody, non-bilious emesis over the past month. She has tried Maalox for her reflux symptoms with no relief. She notes she would really like an endoscopy here in the ER tonight due to her constant reflux symptoms. Patient states her last colonoscopy was 3 years ago. Patient denies chest pain, shortness of breath, vaginal symptoms, urinary symptoms, weakness, fatigue, and change in bowel habits.   Past Medical History:  Diagnosis Date  . History of chicken pox   . Medical history non-contributory   . Wears glasses     Patient Active Problem List   Diagnosis Date Noted  . Mood disorder (HCC) 09/09/2018  . Primary insomnia 10/18/2017  . Frequent headaches 10/18/2017  . Iron deficiency anemia 09/09/2014    Past Surgical History:  Procedure Laterality Date  . CESAREAN SECTION    . CLOSED REDUCTION METACARPAL WITH PERCUTANEOUS PINNING Left 07/28/2014   Procedure: CLOSED REDUCTION METACARPAL WITH PERCUTANEOUS PINNING PROXIMAL PHALANX FRACTURES LEFT MIDDLE RING AND SMALL FINGERS;  Surgeon: Betha Loa, MD;  Location: [facility];  Service: Orthopedics;  Laterality: Left;  . HEMORROIDECTOMY    . INTRAUTERINE DEVICE INSERTION       OB History   No obstetric history on file.      Home Medications    Prior to Admission medications   Medication Sig Start Date End Date Taking? Authorizing  Provider  acetaminophen (TYLENOL) 500 MG tablet Take 500 mg by mouth every 6 (six) hours as needed for moderate pain.   Yes [provider]  alum & mag hydroxide-simeth (MAALOX/MYLANTA) 200-200-20 MG/5ML suspension Take 30 mLs by mouth every 6 (six) hours as needed for indigestion or heartburn.   Yes [provider]  amoxicillin (AMOXIL) 500 MG capsule Take 500 mg by mouth 3 (three) times daily. 03/25/19  Yes [provider]  aspirin-acetaminophen-caffeine (EXCEDRIN MIGRAINE) [phone] MG tablet Take 1 tablet by mouth every 6 (six) hours as needed for headache.   Yes [provider]  diphenhydrAMINE (BENADRYL) 25 mg capsule Take 25 mg by mouth at bedtime as needed for itching, allergies or sleep.   Yes [provider]  escitalopram (LEXAPRO) 20 MG tablet Take 1 tablet (20 mg total) by mouth daily. MUST SCHEDULE PHYSICAL EXAM 03/04/19  Yes Baity, Salvadore Oxford, NP  fluticasone (FLONASE) 50 MCG/ACT nasal spray Place 2 sprays into both nostrils daily. 03/10/19  Yes Martin, Mary-Margaret, FNP  ibuprofen (ADVIL) 200 MG tablet Take 200 mg by mouth every 6 (six) hours as needed for moderate pain.   Yes [provider]  Polyethyl Glycol-Propyl Glycol (SYSTANE OP) Place 1 drop into both eyes daily.    Yes [provider]  PREMPRO 0.3-1.5 MG tablet Take 1 tablet by mouth daily. MUST SCHEDULE PHYSICAL 03/04/19  Yes Lorre Munroe, NP  zolpidem (AMBIEN CR) 12.5 MG CR tablet TAKE 1 TABLET BY MOUTH AT BEDTIME  03/19/19  Yes Jearld Fenton, NP    Family History No family history on file.  Social History Social History   Tobacco Use  . Smoking status: Never Smoker  . Smokeless tobacco: Never Used  Substance Use Topics  . Alcohol use: No  . Drug use: No     Allergies   Oxycodone   Review of Systems Review of Systems  Constitutional: Negative for chills and fever.  Respiratory: Negative for shortness of breath.   Cardiovascular: Negative for  chest pain.  Gastrointestinal: Positive for abdominal distention, abdominal pain, nausea and vomiting. Negative for constipation and diarrhea.  All other systems reviewed and are negative.    Physical Exam Updated Vital Signs BP (!) 145/98 (BP Location: Left Arm)   Pulse 87   Temp 98 F (36.7 C) (Oral)   Resp 16   SpO2 100%   Physical Exam Constitutional:      General: She is not in acute distress.    Appearance: She is not ill-appearing.  HENT:     Head: Normocephalic.  Eyes:     Pupils: Pupils are equal, round, and reactive to light.  Neck:     Musculoskeletal: Normal range of motion and neck supple.  Cardiovascular:     Rate and Rhythm: Normal rate and regular rhythm.     Pulses: Normal pulses.     Heart sounds: Normal heart sounds. No murmur. No friction rub. No gallop.   Pulmonary:     Effort: Pulmonary effort is normal.     Breath sounds: Normal breath sounds.  Abdominal:     General: Abdomen is flat. Bowel sounds are normal. There is no distension.     Palpations: Abdomen is soft.     Tenderness: There is abdominal tenderness (mild RUQ tenderness). There is no right CVA tenderness, left CVA tenderness, guarding or rebound.     Hernia: No hernia is present.     Comments: Negative Murphy sign. Negative Mcburney. Numerous injection scars throughout abdomen. No erythema, drainage, or edema.  Musculoskeletal: Normal range of motion.  Skin:    General: Skin is warm and dry.  Neurological:     General: No focal deficit present.     Mental Status: She is alert.      ED Treatments / Results  Labs (all labs ordered are listed, but only abnormal results are displayed) Labs Reviewed  COMPREHENSIVE METABOLIC PANEL - Abnormal; Notable for the following components:      Result Value   Glucose, Bld 108 (*)    Creatinine, Ser 1.20 (*)    GFR calc non Af Amer 51 (*)    GFR calc Af Amer 59 (*)    All other components within normal limits  CBC - Abnormal; Notable for the  following components:   Hemoglobin 9.6 (*)    HCT 33.7 (*)    MCV 77.3 (*)    MCH 22.0 (*)    MCHC 28.5 (*)    RDW 27.5 (*)    Platelets 410 (*)    All other components within normal limits  LIPASE, BLOOD  URINALYSIS, ROUTINE W REFLEX MICROSCOPIC  I-STAT BETA HCG BLOOD, ED ([facility], [facility], [facility] ONLY)    EKG None  Radiology No results found.  Procedures Procedures (including critical care time)  Medications Ordered in ED Medications  sodium chloride 0.9 % bolus 1,000 mL (has no administration in time range)  alum & mag hydroxide-simeth (MAALOX/MYLANTA) 200-200-20 MG/5ML suspension 30 mL (30 mLs Oral Given 03/28/19  2304)    And  lidocaine (XYLOCAINE) 2 % viscous mouth solution 15 mL (15 mLs Oral Given 03/28/19 2304)     Initial Impression / Assessment and Plan / ED Course  I have reviewed the triage vital signs and the nursing notes.  Pertinent labs & imaging results that were available during my care of the patient were reviewed by me and considered in my medical decision making (see chart for details).       Yecenia [name] is a 53 year old female who presents to the ED for an evaluation of RUQ pain and reflux symptoms for the past month. Upon arrival, vitals all within normal limits except for elevated blood pressure of 158/96. Will continue to monitor. On physical exam, patient is in no acute distress and non-ill appearing. Her abdomen is soft, non-distended with diffuse tenderness more significant in RUQ. No rebound. No guarding. Numerous injection marks noted on abdomen. Patient notes she takes hcg injections monthly for weight loss. Negative murphy. Negative Mcburney point tenderness. Triage ordered abdominal labs. Will order RUQ to rule out gallbladder etiology. Will order GI cocktail, zofran, and IVFs due to increase in creatinine.  CBC appears to be at baseline when compared to previous labs. Hemoglobin 9.6 which has increased from 3 days ago. Patient is not symptomatic from her  anemia so no need for transfusion at this time. CMP unremarkable except for creatinine of 1.2. Will give bolus. UA with no signs of infection. Lipase normal. RUQ US negative for cholelithiasis or cholecystitis.   Will treat symptomatically for GERD with Protonix. Education was given about different foods that can cause reflux and a print out was given to patient. Patient was given the number of GI and advised to schedule an appointment. Patient also advised to follow-up with PCP for elevated BP readings. Strict ED precautions discussed with patient. Patient states understanding and agrees to plan. Patient discharged home in no acute distress and vitals within normal limits except for elevation in BP at 145/103; however, patient is asymptomatic with no signs of hypertensive emergency.    Final Clinical Impressions(s) / ED Diagnoses   Final diagnoses:  RUQ pain    ED Discharge Orders    None       Lorelle FormosaCheek, Caroline B, PA-C 03/29/19 0809    Palumbo, April, MD 03/29/19 2339

## 2019-03-28 NOTE — Patient Instructions (Signed)

## 2019-03-28 NOTE — ED Triage Notes (Signed)
Pt c/o intermittent abd pains "for a while" with nausea. Has iron infusions. Denies bowel or urinary problems.

## 2019-03-29 MED ORDER — PANTOPRAZOLE SODIUM 20 MG PO TBEC: 20.0000 mg | DELAYED_RELEASE_TABLET | Freq: Every day | ORAL | 0 refills | Status: DC

## 2019-03-29 MED FILL — PANTOPRAZOLE SOD DR 20 MG T: 20 | 30 days supply | Qty: 30 | Fill #0

## 2019-03-29 NOTE — ED Notes (Signed)
Pt was verbalized discharge instructions. Pt had no further questions at this time. NAD. 

## 2019-03-29 NOTE — Discharge Instructions (Signed)
I have given you the phone number of the GI doctor. Call and make an appointment with them within the next week. Follow-up with your primary care doctor within the next week. I am also sending you home with Protonix. Please take as prescribed. I have included an article of foods to avoid to help with your reflux symptoms. Return to the ER if you have new or worsening symptoms.

## 2019-03-30 ENCOUNTER — Encounter (HOSPITAL_COMMUNITY): Payer: Self-pay

## 2019-03-30 ENCOUNTER — Emergency Department (HOSPITAL_COMMUNITY)
Admission: EM | Admit: 2019-03-30 | Discharge: 2019-03-30 | Disposition: A | Discharge status: Home / Self Care | Payer: No Typology Code available for payment source | Attending: Emergency Medicine | Admitting: Emergency Medicine

## 2019-03-30 ENCOUNTER — Other Ambulatory Visit: Payer: Self-pay

## 2019-03-30 DIAGNOSIS — R109 Unspecified abdominal pain: Secondary | ICD-10-CM | POA: Diagnosis present

## 2019-03-30 DIAGNOSIS — Z5321 Procedure and treatment not carried out due to patient leaving prior to being seen by health care provider: Secondary | ICD-10-CM | POA: Diagnosis not present

## 2019-03-30 MED ORDER — PANTOPRAZOLE SODIUM 20 MG PO TBEC: 20.0000 mg | DELAYED_RELEASE_TABLET | Freq: Every day | ORAL | 0 refills | Status: DC

## 2019-03-30 NOTE — ED Triage Notes (Signed)
Pt states she is having twisting right sided abd pain. Pt was seen for same on 10/23. Pt had rx sent to [facility], and did not pick up that day. She has been unable to take protonix. Pt returns so she can get rx.

## 2019-04-01 ENCOUNTER — Encounter: Payer: Self-pay | Admitting: Internal Medicine

## 2019-04-01 ENCOUNTER — Ambulatory Visit (INDEPENDENT_AMBULATORY_CARE_PROVIDER_SITE_OTHER): Payer: No Typology Code available for payment source | Admitting: Internal Medicine

## 2019-04-01 DIAGNOSIS — K219 Gastro-esophageal reflux disease without esophagitis: Secondary | ICD-10-CM | POA: Diagnosis not present

## 2019-04-01 DIAGNOSIS — R1013 Epigastric pain: Secondary | ICD-10-CM

## 2019-04-01 DIAGNOSIS — R112 Nausea with vomiting, unspecified: Secondary | ICD-10-CM

## 2019-04-01 MED ORDER — PANTOPRAZOLE SODIUM 40 MG PO TBEC: 40.0000 mg | DELAYED_RELEASE_TABLET | Freq: Every day | ORAL | 2 refills | Status: DC

## 2019-04-01 NOTE — Progress Notes (Signed)
Virtual Visit via Video Note  I connected with Crystal Morris on 04/06/19 at  2:00 PM EDT by a video enabled telemedicine application and verified that I am speaking with the correct person using two identifiers.  Location: Patient: Home Provider: Office   I discussed the limitations of evaluation and management by telemedicine and the availability of in person appointments. The patient expressed understanding and agreed to proceed.  History of Present Illness:  Pt due for ER follow up . She went to the ER 10/23 with c/o abdominal pain. Labs were unremarkable. RUQ ultrasound negative for gallstone or cholelithiasis. She was given fluids, GI cocktail and Zofran with some improvement in symptoms.  She was diagnosed with GERD, started on Pantoprazole and advised to follow up with her PCP. Since discharge, she does report minimal improvement in pain. She continues intermittent nausea and vomiting.  She does not feel like this is being triggered by certain foods. Her bowels are moving normally. She denies  constipation, diarrhea or blood in her stool. She would like a referral to GI for further evaluation.    Past Medical History:  Diagnosis Date  . History of chicken pox   . Medical history non-contributory   . Wears glasses     Current Outpatient Medications  Medication Sig Dispense Refill  . acetaminophen (TYLENOL) 500 MG tablet Take 500 mg by mouth every 6 (six) hours as needed for moderate pain.    Marland Kitchen alum & mag hydroxide-simeth (MAALOX/MYLANTA) 200-200-20 MG/5ML suspension Take 30 mLs by mouth every 6 (six) hours as needed for indigestion or heartburn.    Marland Kitchen amoxicillin (AMOXIL) 500 MG capsule Take 500 mg by mouth 3 (three) times daily.    Marland Kitchen aspirin-acetaminophen-caffeine (EXCEDRIN MIGRAINE) 250-250-65 MG tablet Take 1 tablet by mouth every 6 (six) hours as needed for headache.    . diphenhydrAMINE (BENADRYL) 25 mg capsule Take 25 mg by mouth at bedtime as needed for itching, allergies  or sleep.    Marland Kitchen escitalopram (LEXAPRO) 20 MG tablet Take 1 tablet (20 mg total) by mouth daily. MUST SCHEDULE PHYSICAL EXAM 90 tablet 0  . fluticasone (FLONASE) 50 MCG/ACT nasal spray Place 2 sprays into both nostrils daily. 16 g 6  . ibuprofen (ADVIL) 200 MG tablet Take 200 mg by mouth every 6 (six) hours as needed for moderate pain.    Vladimir Faster Glycol-Propyl Glycol (SYSTANE OP) Place 1 drop into both eyes daily.     Marland Kitchen PREMPRO 0.3-1.5 MG tablet Take 1 tablet by mouth daily. MUST SCHEDULE PHYSICAL 84 tablet 0  . zolpidem (AMBIEN CR) 12.5 MG CR tablet TAKE 1 TABLET BY MOUTH AT BEDTIME 30 tablet 0  . pantoprazole (PROTONIX) 40 MG tablet Take 1 tablet (40 mg total) by mouth daily. 30 tablet 2   No current facility-administered medications for this visit.     Allergies  Allergen Reactions  . Oxycodone Itching    History reviewed. No pertinent family history.  Social History   Socioeconomic History  . Marital status: Married    Spouse name: Not on file  . Number of children: Not on file  . Years of education: Not on file  . Highest education level: Not on file  Occupational History  . Not on file  Social Needs  . Financial resource strain: Not on file  . Food insecurity    Worry: Not on file    Inability: Not on file  . Transportation needs    Medical: Not on file  Non-medical: Not on file  Tobacco Use  . Smoking status: Never Smoker  . Smokeless tobacco: Never Used  Substance and Sexual Activity  . Alcohol use: No  . Drug use: No  . Sexual activity: Not on file  Lifestyle  . Physical activity    Days per week: Not on file    Minutes per session: Not on file  . Stress: Not on file  Relationships  . Social Musicianconnections    Talks on phone: Not on file    Gets together: Not on file    Attends religious service: Not on file    Active member of club or organization: Not on file    Attends meetings of clubs or organizations: Not on file    Relationship status: Not on  file  . Intimate partner violence    Fear of current or ex partner: Not on file    Emotionally abused: Not on file    Physically abused: Not on file    Forced sexual activity: Not on file  Other Topics Concern  . Not on file  Social History Narrative  . Not on file     Constitutional: Denies fever, malaise, fatigue, headache or abrupt weight changes.  Respiratory: Denies difficulty breathing, shortness of breath, cough or sputum production.   Cardiovascular: Denies chest pain, chest tightness, palpitations or swelling in the hands or feet.  Gastrointestinal: Pt reports abdominal pain, nausea and vomiting. Denies bloating, constipation, diarrhea or blood in the stool.  GU: Denies urgency, frequency, pain with urination, burning sensation, blood in urine, odor or discharge.  No other specific complaints in a complete review of systems (except as listed in HPI above).  Observations/Objective:   Wt Readings from Last 3 Encounters:  03/26/19 70.6 kg  04/03/18 61.7 kg  10/18/17 68.9 kg    General: Appears her stated age, well developed, well nourished in NAD. Skin: Warm, dry and intact. No rashesnoted. Pulmonary/Chest: Normal effort. No respiratory distress.  Abdomen: She points to the epigastric region as her sight of pain.  Neurological: Alert and oriented.   BMET    Component Value Date/Time   NA 136 03/28/2019 1826   NA 138 03/23/2015 0808   K 3.6 03/28/2019 1826   K 4.9 03/23/2015 0808   CL 101 03/28/2019 1826   CO2 24 03/28/2019 1826   CO2 25 03/23/2015 0808   GLUCOSE 108 (H) 03/28/2019 1826   GLUCOSE 86 03/23/2015 0808   BUN 14 03/28/2019 1826   BUN 19.1 03/23/2015 0808   CREATININE 1.20 (H) 03/28/2019 1826   CREATININE 1.0 03/23/2015 0808   CALCIUM 9.5 03/28/2019 1826   CALCIUM 8.7 03/23/2015 0808   GFRNONAA 51 (L) 03/28/2019 1826   GFRAA 59 (L) 03/28/2019 1826    Lipid Panel     Component Value Date/Time   CHOL 156 04/03/2018 1442   TRIG 148.0  04/03/2018 1442   HDL 52.30 04/03/2018 1442   CHOLHDL 3 04/03/2018 1442   VLDL 29.6 04/03/2018 1442   LDLCALC 74 04/03/2018 1442    CBC    Component Value Date/Time   WBC 6.4 03/28/2019 1826   RBC 4.36 03/28/2019 1826   HGB 9.6 (L) 03/28/2019 1826   HGB 8.6 (L) 03/26/2019 0818   HGB 11.6 03/23/2015 0808   HCT 33.7 (L) 03/28/2019 1826   HCT 37.4 03/23/2015 0808   PLT 410 (H) 03/28/2019 1826   PLT 435 (H) 03/26/2019 0818   PLT 302 03/23/2015 0808   MCV 77.3 (L)  03/28/2019 1826   MCV 86.2 03/23/2015 0808   MCH 22.0 (L) 03/28/2019 1826   MCHC 28.5 (L) 03/28/2019 1826   RDW 27.5 (H) 03/28/2019 1826   RDW 13.0 03/23/2015 0808   LYMPHSABS 1.7 03/26/2019 0818   LYMPHSABS 1.5 03/23/2015 0808   MONOABS 0.5 03/26/2019 0818   MONOABS 0.3 03/23/2015 0808   EOSABS 0.1 03/26/2019 0818   EOSABS 0.0 03/23/2015 0808   BASOSABS 0.0 03/26/2019 0818   BASOSABS 0.0 03/23/2015 0808    Hgb A1C No results found for: HGBA1C     Assessment and Plan:  ER Follow Up for Abdominal Pain, GERD:  ER notes, labs and imaging reviewed She may need H Pylori testing Continue Pantoprazole as prescribed, refilled today Referral to GI placed per patients request  Return precautions discussed  Follow Up Instructions:    I discussed the assessment and treatment plan with the patient. The patient was provided an opportunity to ask questions and all were answered. The patient agreed with the plan and demonstrated an understanding of the instructions.   The patient was advised to call back or seek an in-person evaluation if the symptoms worsen or if the condition fails to improve as anticipated.     Nicki Reaper, NP

## 2019-04-02 ENCOUNTER — Encounter: Payer: Self-pay | Admitting: Physician Assistant

## 2019-04-03 ENCOUNTER — Encounter: Payer: Self-pay | Admitting: Internal Medicine

## 2019-04-06 NOTE — Patient Instructions (Signed)

## 2019-04-07 ENCOUNTER — Telehealth: Payer: Self-pay | Admitting: Internal Medicine

## 2019-04-07 NOTE — Telephone Encounter (Signed)
Done, given back to Crystal Morris 

## 2019-04-07 NOTE — Telephone Encounter (Signed)
Spouse dropped of FMLA paperwork Pt has GI appointment 11/5  Paperwork in [facility] in box For review and signature

## 2019-04-08 ENCOUNTER — Other Ambulatory Visit: Payer: Self-pay | Admitting: Internal Medicine

## 2019-04-08 ENCOUNTER — Telehealth: Payer: Self-pay | Admitting: Internal Medicine

## 2019-04-08 ENCOUNTER — Telehealth: Payer: Self-pay

## 2019-04-08 DIAGNOSIS — F5101 Primary insomnia: Secondary | ICD-10-CM

## 2019-04-08 NOTE — Telephone Encounter (Signed)
Patient called asking to speak with Threasa Beards in regards to her appt with [facility] on 11/5. I advised her that Threasa Beards was unavailable, and I asked if there was anything I could assist her with. Patient would not elaborate with me what she needed, and asked that I just have Threasa Beards return her phone call.

## 2019-04-08 NOTE — Telephone Encounter (Signed)
Pt is aware paperwork is ready for pick up and she will have to turn in  Copy for pt Copy for scan

## 2019-04-10 ENCOUNTER — Other Ambulatory Visit: Payer: Self-pay

## 2019-04-10 ENCOUNTER — Ambulatory Visit (INDEPENDENT_AMBULATORY_CARE_PROVIDER_SITE_OTHER): Payer: No Typology Code available for payment source | Admitting: Physician Assistant

## 2019-04-10 ENCOUNTER — Other Ambulatory Visit (INDEPENDENT_AMBULATORY_CARE_PROVIDER_SITE_OTHER): Payer: No Typology Code available for payment source

## 2019-04-10 ENCOUNTER — Encounter: Payer: Self-pay | Admitting: Physician Assistant

## 2019-04-10 VITALS — BP 126/70 | HR 73 | Temp 98.3°F | Ht 60.0 in | Wt 150.0 lb

## 2019-04-10 DIAGNOSIS — D509 Iron deficiency anemia, unspecified: Secondary | ICD-10-CM

## 2019-04-10 DIAGNOSIS — Z1159 Encounter for screening for other viral diseases: Secondary | ICD-10-CM | POA: Diagnosis not present

## 2019-04-10 DIAGNOSIS — K920 Hematemesis: Secondary | ICD-10-CM

## 2019-04-10 DIAGNOSIS — K921 Melena: Secondary | ICD-10-CM

## 2019-04-10 DIAGNOSIS — R1013 Epigastric pain: Secondary | ICD-10-CM

## 2019-04-10 LAB — CBC
HCT: 37.5 % (ref 36.0–46.0)
Hemoglobin: 11.4 g/dL — ABNORMAL LOW (ref 12.0–15.0)
MCHC: 30.3 g/dL (ref 30.0–36.0)
MCV: 76.3 fl — ABNORMAL LOW (ref 78.0–100.0)
Platelets: 369 10*3/uL (ref 150.0–400.0)
RBC: 4.91 Mil/uL (ref 3.87–5.11)
RDW: 28.9 % — ABNORMAL HIGH (ref 11.5–15.5)
WBC: 4.9 10*3/uL (ref 4.0–10.5)

## 2019-04-10 LAB — SARS CORONAVIRUS 2 (TAT 6-24 HRS): SARS Coronavirus 2: NEGATIVE

## 2019-04-10 MED ORDER — NA SULFATE-K SULFATE-MG SULF 17.5-3.13-1.6 GM/177ML PO SOLN: 1.0000 | ORAL | 0 refills | Status: DC

## 2019-04-10 MED FILL — SUPREP BOWEL PREP KIT: 17.5-3.13-1 | 2 days supply | Qty: 354 | Fill #0

## 2019-04-10 NOTE — Patient Instructions (Signed)
You have been scheduled for an endoscopy and colonoscopy. Please follow the written instructions given to you at your visit today. Please pick up your prep supplies at the pharmacy within the next 1-3 days. If you use inhalers (even only as needed), please bring them with you on the day of your procedure.  
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
## 2019-04-11 MED FILL — PANTOPRAZOLE SOD DR 40 MG T: 40 | 30 days supply | Qty: 30 | Fill #0

## 2019-04-11 NOTE — Telephone Encounter (Signed)
No early refill.  

## 2019-04-11 NOTE — Telephone Encounter (Signed)
Spoke to pt and let her know the last Rx was filled 03/19/2019, she should not be out until next week and this is a controlled substance Rx can not be filled early.... At first pt stated she was out of medication... after letting her know that it can not be filled until next Friday, she then reports she had 5 pills left.... I tried to verify date on Rx bottle, pt proceeded to say she will just call the pharmacy to request... I advised pt again, that it has nothing to do with requesting medication from pharmacy that if she filled the Rx on or around 03/19/2019 it is too soon to get refill... pt stated again she will just call the pharmacy and hung up telephone  [place] and it is showing that it was delivered 03/20/2019 to the pt.Marland KitchenMarland KitchenMarland Kitchen

## 2019-04-11 NOTE — Telephone Encounter (Signed)
Patient called to schedule her CPE + LABS .  She stated she is completely out of this medication and she has not had this medication in several days,

## 2019-04-14 ENCOUNTER — Encounter: Payer: Self-pay | Admitting: Gastroenterology

## 2019-04-14 ENCOUNTER — Ambulatory Visit (AMBULATORY_SURGERY_CENTER): Payer: No Typology Code available for payment source | Admitting: Gastroenterology

## 2019-04-14 ENCOUNTER — Other Ambulatory Visit: Payer: Self-pay

## 2019-04-14 VITALS — BP 143/90 | HR 77 | Temp 98.4°F | Resp 22 | Ht 60.0 in | Wt 150.0 lb

## 2019-04-14 DIAGNOSIS — K2970 Gastritis, unspecified, without bleeding: Secondary | ICD-10-CM

## 2019-04-14 DIAGNOSIS — K920 Hematemesis: Secondary | ICD-10-CM

## 2019-04-14 DIAGNOSIS — K259 Gastric ulcer, unspecified as acute or chronic, without hemorrhage or perforation: Secondary | ICD-10-CM

## 2019-04-14 DIAGNOSIS — K269 Duodenal ulcer, unspecified as acute or chronic, without hemorrhage or perforation: Secondary | ICD-10-CM

## 2019-04-14 DIAGNOSIS — K2990 Gastroduodenitis, unspecified, without bleeding: Secondary | ICD-10-CM

## 2019-04-14 DIAGNOSIS — K449 Diaphragmatic hernia without obstruction or gangrene: Secondary | ICD-10-CM

## 2019-04-14 DIAGNOSIS — K2210 Ulcer of esophagus without bleeding: Secondary | ICD-10-CM

## 2019-04-14 DIAGNOSIS — D509 Iron deficiency anemia, unspecified: Secondary | ICD-10-CM

## 2019-04-14 DIAGNOSIS — K315 Obstruction of duodenum: Secondary | ICD-10-CM

## 2019-04-14 DIAGNOSIS — K2980 Duodenitis without bleeding: Secondary | ICD-10-CM

## 2019-04-14 DIAGNOSIS — K648 Other hemorrhoids: Secondary | ICD-10-CM | POA: Diagnosis not present

## 2019-04-14 MED ORDER — SODIUM CHLORIDE 0.9 % IV SOLN: 500.0000 mL | Freq: Once | INTRAVENOUS | Status: DC

## 2019-04-14 MED ORDER — FAMOTIDINE 20 MG PO TABS: 20.0000 mg | ORAL_TABLET | Freq: Every day | ORAL | 4 refills | Status: DC

## 2019-04-14 MED ORDER — PANTOPRAZOLE SODIUM 40 MG PO TBEC: 40.0000 mg | DELAYED_RELEASE_TABLET | Freq: Two times a day (BID) | ORAL | 12 refills | Status: DC

## 2019-04-14 MED FILL — FAMOTIDINE 20 MG TABLET: 20 | 30 days supply | Qty: 30 | Fill #0

## 2019-04-14 NOTE — Progress Notes (Signed)
Temp check by:LC Vital check by:[place]  The medical and surgical history was reviewed and verified with the patient. 

## 2019-04-14 NOTE — Op Note (Signed)
[facility] Patient Name: Crystal Morris Procedure Date: 04/14/2019 8:53 AM MRN: [id number] Endoscopist: Jackquline Denmark , MD Age: 53 Referring MD:  Date of Birth: August 23, 1965 Gender: Female Account #: [id number] Procedure:                Colonoscopy Indications:              Iron deficiency anemia secondary to chronic blood                            loss Medicines:                Monitored Anesthesia Care Procedure:                Pre-Anesthesia Assessment:                           - Prior to the procedure, a History and Physical                            was performed, and patient medications and                            allergies were reviewed. The patient's tolerance of                            previous anesthesia was also reviewed. The risks                            and benefits of the procedure and the sedation                            options and risks were discussed with the patient.                            All questions were answered, and informed consent                            was obtained. Prior Anticoagulants: The patient has                            taken no previous anticoagulant or antiplatelet                            agents. ASA Grade Assessment: II - A patient with                            mild systemic disease. After reviewing the risks                            and benefits, the patient was deemed in                            satisfactory condition to undergo the procedure.  After obtaining informed consent, the colonoscope                            was passed under direct vision. Throughout the                            procedure, the patient's blood pressure, pulse, and                            oxygen saturations were monitored continuously. The                            Colonoscope was introduced through the anus and                            advanced to the 2 cm into the ileum. The            colonoscopy was somewhat difficult due to a                            redundant colon. Successful completion of the                            procedure was aided by applying abdominal pressure.                            The patient tolerated the procedure well. The                            quality of the bowel preparation was good. The                            terminal ileum, ileocecal valve, appendiceal                            orifice, and rectum were photographed. Scope In: 9:05:09 AM Scope Out: 9:24:11 AM Scope Withdrawal Time: 0 hours 10 minutes 58 seconds  Total Procedure Duration: 0 hours 19 minutes 2 seconds  Findings:                 Non-bleeding internal hemorrhoids were found during                            retroflexion. The hemorrhoids were small.                           The colon (entire examined portion) appeared normal.                           The terminal ileum appeared normal.                           The exam was otherwise without abnormality on  direct and retroflexion views. The colon was highly                            redundant. Complications:            No immediate complications. Estimated Blood Loss:     Estimated blood loss: none. Impression:               - Non-bleeding internal hemorrhoids.                           - Otherwise normal colonoscopy to TI. The colon was                            highly redundant. Recommendation:           - Patient has a contact number available for                            emergencies. The signs and symptoms of potential                            delayed complications were discussed with the                            patient. Return to normal activities tomorrow.                            Written discharge instructions were provided to the                            patient.                           - Resume previous diet.                           - Continue present  medications.                           - Repeat colonoscopy in 10 years for screening                            purposes. Earlier, if with any new problems or if                            there is any change in family history.                           - Return to GI clinic in 6 weeks. Lynann Bologna, MD 04/14/2019 9:29:53 AM This report has been signed electronically.

## 2019-04-14 NOTE — Progress Notes (Signed)
To PACU, VSS. Report to [email or web address] 

## 2019-04-14 NOTE — Op Note (Signed)
[facility] Patient Name: Crystal Morris Procedure Date: 04/14/2019 8:53 AM MRN: [id number] Endoscopist: Lynann Bologna , MD Age: 53 Referring MD:  Date of Birth: 1965/07/02 Gender: Female Account #: [id number] Procedure:                Upper GI endoscopy Indications:              Iron deficiency anemia secondary to chronic blood                            loss, Melena Medicines:                Monitored Anesthesia Care Procedure:                Pre-Anesthesia Assessment:                           - Prior to the procedure, a History and Physical                            was performed, and patient medications and                            allergies were reviewed. The patient's tolerance of                            previous anesthesia was also reviewed. The risks                            and benefits of the procedure and the sedation                            options and risks were discussed with the patient.                            All questions were answered, and informed consent                            was obtained. Prior Anticoagulants: The patient has                            taken no previous anticoagulant or antiplatelet                            agents. ASA Grade Assessment: II - A patient with                            mild systemic disease. After reviewing the risks                            and benefits, the patient was deemed in                            satisfactory condition to undergo the procedure.                           -  Prior to the procedure, a History and Physical                            was performed, and patient medications and                            allergies were reviewed. The patient's tolerance of                            previous anesthesia was also reviewed. The risks                            and benefits of the procedure and the sedation                            options and risks were discussed with the patient.                            All questions were answered, and informed consent                            was obtained. Prior Anticoagulants: The patient has                            taken no previous anticoagulant or antiplatelet                            agents. ASA Grade Assessment: II - A patient with                            mild systemic disease. After reviewing the risks                            and benefits, the patient was deemed in                            satisfactory condition to undergo the procedure.                           After obtaining informed consent, the endoscope was                            passed under direct vision. Throughout the                            procedure, the patient's blood pressure, pulse, and                            oxygen saturations were monitored continuously. The                            Endoscope was introduced through the mouth, and  advanced to the duodenal bulb. The upper GI                            endoscopy was accomplished without difficulty. The                            patient tolerated the procedure well. Scope In: Scope Out: Findings:                 The examined esophagus was normal except for a                            small 5 mm erosion at the Z-line, 35 cm from the                            incisors.                           A small transient hiatal hernia was present.                           Three non-bleeding superficial gastric ulcers with                            clean whitish base and no stigmata of bleeding were                            found in the gastric antrum. The largest lesion was                            10 mm in largest dimension. There was mild                            surrounding gastritis. Biopsies were taken with a                            cold forceps for histology.                           Two non-bleeding cratered duodenal ulcers with no                             stigmata of bleeding were found in the duodenal                            bulb and in the first portion of the duodenum. The                            largest lesion was 10 mm in largest dimension.                            Estimated blood loss was minimal. The second  portion of the duodenum could not be intubated due                            to likely a duodenal stricture. When the attempt                            was made to pass the scope there was some oozing.                            Hence, we decided to hold off. Complications:            No immediate complications. Estimated Blood Loss:     Estimated blood loss: none. Impression:               -Gastric ulcers with surrounding gastritis.                           -Duodenal ulcers with duodenal stenosis/stricture. Recommendation:           - Patient has a contact number available for                            emergencies. The signs and symptoms of potential                            delayed complications were discussed with the                            patient. Return to normal activities tomorrow.                            Written discharge instructions were provided to the                            patient.                           - Resume previous diet.                           - Increase Protonix to 40 mg p.o. twice daily. 60,                            12 refills.                           - Start taking Pepcid 20 mg p.o. nightly, 30, 4                            refills.                           - Follow biopsies.                           - No aspirin, ibuprofen, naproxen, or other  non-steroidal anti-inflammatory drugs.                           - Proceed with colonoscopy.                           - She would need upper GI series in 4 to 6 weeks                            (to evaluate the duodenal sweep) and repeat upper                             endoscopy with possible duodenal stricture                            dilatation/and to check healing in 12 weeks.                           - Return to GI office in 4 weeks. Lynann Bolognaajesh Kristyne Woodring, MD 04/14/2019 9:37:47 AM This report has been signed electronically.

## 2019-04-14 NOTE — Progress Notes (Signed)
Called to room to assist during endoscopic procedure.  Patient ID and intended procedure confirmed with present staff. Received instructions for my participation in the procedure from the performing physician.  

## 2019-04-14 NOTE — Progress Notes (Signed)
Thanks Agree with the plan  RG 

## 2019-04-14 NOTE — Patient Instructions (Signed)
PICK UP YOUR NEW PRESCRIPTIONS FROM [facility].  NO ASPIRIN, ASPIRIN CONTAINING PRODUCTS (BC OR GOODY POWDERS) OR NSAIDS (IBUPROFEN, ADVIL, ALEVE, AND MOTRIN)   TYLENOL IS OK TO TAKE  EXPECT A CALL FROM DR Steve Rattler OFFICE TO SCHEDULE AN UPPER GI SERIES AND OFFICE VISIT  HANDOUTS GIVEN: GASTRITIS,GERD,PEPTIC ULCERS AND NSAIDS,AND HEMORRHOIDS   YOU HAD AN ENDOSCOPIC PROCEDURE TODAY AT [facility]:   Refer to the procedure report that was given to you for any specific questions about what was found during the examination.  If the procedure report does not answer your questions, please call your gastroenterologist to clarify.  If you requested that your care partner not be given the details of your procedure findings, then the procedure report has been included in a sealed envelope for you to review at your convenience later.  YOU SHOULD EXPECT: Some feelings of bloating in the abdomen. Passage of more gas than usual.  Walking can help get rid of the air that was put into your GI tract during the procedure and reduce the bloating. If you had a lower endoscopy (such as a colonoscopy or flexible sigmoidoscopy) you may notice spotting of blood in your stool or on the toilet paper. If you underwent a bowel prep for your procedure, you may not have a normal bowel movement for a few days.  Please Note:  You might notice some irritation and congestion in your nose or some drainage.  This is from the oxygen used during your procedure.  There is no need for concern and it should clear up in a day or so.  SYMPTOMS TO REPORT IMMEDIATELY:   Following lower endoscopy (colonoscopy or flexible sigmoidoscopy):  Excessive amounts of blood in the stool  Significant tenderness or worsening of abdominal pains  Swelling of the abdomen that is new, acute  Fever of 100F or higher   Following upper endoscopy (EGD)  Vomiting of blood or coffee ground material  New chest pain or pain under  the shoulder blades  Painful or persistently difficult swallowing  New shortness of breath  Fever of 100F or higher  Black, tarry-looking stools  For urgent or emergent issues, a gastroenterologist can be reached at any hour by calling [phone].   DIET:  We do recommend a small meal at first, but then you may proceed to your regular diet.  Drink plenty of fluids but you should avoid alcoholic beverages for 24 hours.  ACTIVITY:  You should plan to take it easy for the rest of today and you should NOT DRIVE or use heavy machinery until tomorrow (because of the sedation medicines used during the test).    FOLLOW UP: Our staff will call the number listed on your records 48-72 hours following your procedure to check on you and address any questions or concerns that you may have regarding the information given to you following your procedure. If we do not reach you, we will leave a message.  We will attempt to reach you two times.  During this call, we will ask if you have developed any symptoms of COVID 19. If you develop any symptoms (ie: fever, flu-like symptoms, shortness of breath, cough etc.) before then, please call [phone].  If you test positive for Covid 19 in the 2 weeks post procedure, please call and report this information to [place].    If any biopsies were taken you will be contacted by phone or by letter within the next 1-3 weeks.  Please call us at [phone] if you have not heard about the biopsies in 3 weeks.    SIGNATURES/CONFIDENTIALITY: You and/or your care partner have signed paperwork which will be entered into your electronic medical record.  These signatures attest to the fact that that the information above on your After Visit Summary has been reviewed and is understood.  Full responsibility of the confidentiality of this discharge information lies with you and/or your care-partner.

## 2019-04-15 ENCOUNTER — Other Ambulatory Visit (HOSPITAL_COMMUNITY): Payer: Self-pay | Admitting: Internal Medicine

## 2019-04-15 DIAGNOSIS — Z1231 Encounter for screening mammogram for malignant neoplasm of breast: Secondary | ICD-10-CM

## 2019-04-16 ENCOUNTER — Telehealth: Payer: Self-pay

## 2019-04-16 ENCOUNTER — Other Ambulatory Visit: Payer: Self-pay

## 2019-04-16 ENCOUNTER — Ambulatory Visit (HOSPITAL_COMMUNITY): Payer: No Typology Code available for payment source

## 2019-04-16 ENCOUNTER — Ambulatory Visit
Admission: RE | Admit: 2019-04-16 | Discharge: 2019-04-16 | Disposition: A | Discharge status: Home / Self Care | Payer: No Typology Code available for payment source | Source: Ambulatory Visit | Attending: Internal Medicine | Admitting: Internal Medicine

## 2019-04-16 DIAGNOSIS — Z1231 Encounter for screening mammogram for malignant neoplasm of breast: Secondary | ICD-10-CM

## 2019-04-16 NOTE — Telephone Encounter (Signed)
Left message on f/u call 

## 2019-04-16 NOTE — Telephone Encounter (Signed)
1st follow up call made.  NALM 

## 2019-04-17 ENCOUNTER — Telehealth: Payer: Self-pay | Admitting: Gastroenterology

## 2019-04-17 MED ORDER — ZOLPIDEM TARTRATE ER 12.5 MG PO TBCR: 12.5000 mg | EXTENDED_RELEASE_TABLET | Freq: Every day | ORAL | 0 refills | Status: DC

## 2019-04-17 MED FILL — ZOLPIDEM TART ER 12.5 MG TA: 12.5 | 30 days supply | Qty: 30 | Fill #0

## 2019-04-17 NOTE — Telephone Encounter (Signed)
Yesi I am not sure if this needs to go to Dr Steve Rattler nurse (Bri)  or Jennifer's nurse Sherlynn Stalls)

## 2019-04-17 NOTE — Telephone Encounter (Signed)
Last filled 03/19/2019... due for 04/18/2019.Marland KitchenMarland KitchenMarland Kitchen please advise

## 2019-04-18 NOTE — Telephone Encounter (Signed)
Patient's work excuse note ended today 04/18/19. She is asking for an extended 2 weeks because of her pain and nausea. Please advise

## 2019-04-18 NOTE — Telephone Encounter (Signed)
Called patient and spoke to her husband. Let him know we are sending a order to [facility] for a GI Cocktail @ [place]. Gave instructions for taking (5-10 ml Q4hrs. Prn for discomfort). He said his wife is taking PPI 40 mg twice daily. And I asked him to have her increase her Pepcid to 40mg  twice daily. Patient already has 4 refills so she is ok for now with the higher dose. Let him know the medicines will take a while to work, but if patient's pain gets worse, she needs to go to the ED

## 2019-04-18 NOTE — Telephone Encounter (Signed)
Called patient and let her know Dr.Gupta and Ellouise Newer PA both feel she has been diagnosed and treated for her GI symptoms and do not see the necessity to keep her out of work. Both suggest she contact her PCP if she feels there are other things that need to be evalutated

## 2019-04-18 NOTE — Telephone Encounter (Signed)
Patient called and states she is still having a lot of nausea, acid reflux and RUQ pain (esp. After eating) said she is taking the Pepcid at night and increased the Protonix to twice a day, as Dr. Lyndel Safe suggested after her EGD/Colonoscopy on 04/14/19, but it hasn't helped. I told her the medicine will take a while to work, but she states she can't stand feeling this way. Please advise

## 2019-04-18 NOTE — Telephone Encounter (Signed)
Yes, it will take some time for meds to work. Make sure she is on her PPI 40mg  BID and she can increase pepcid to 40 mg BID- can send in new script if needed.   Can also send in gi cocktail 5-10 ml q4 hours prn for additional help with discomfort.   If it gets worse then she will need to go to the ER.  Thanks-JLL

## 2019-04-18 NOTE — Telephone Encounter (Signed)
I agree I believe anxiety may be playing some role as well. Please have her get in touch with her family doctor to evaluate.  RG

## 2019-04-18 NOTE — Telephone Encounter (Signed)
We can not keep extending her leave from work as we have diagnosed her. She will need to follow up with her PCP if she needs further leave.  Dr. Lyndel Safe do you agree?

## 2019-04-20 ENCOUNTER — Encounter: Payer: Self-pay | Admitting: Gastroenterology

## 2019-05-05 MED FILL — PANTOPRAZOLE SOD DR 40 MG T: 40 | 30 days supply | Qty: 60 | Fill #0

## 2019-05-12 MED FILL — FAMOTIDINE 20 MG TABS: 20 | 30 days supply | Qty: 30 | Fill #0

## 2019-05-13 ENCOUNTER — Telehealth (INDEPENDENT_AMBULATORY_CARE_PROVIDER_SITE_OTHER): Payer: No Typology Code available for payment source | Admitting: Gastroenterology

## 2019-05-13 ENCOUNTER — Encounter: Payer: Self-pay | Admitting: Gastroenterology

## 2019-05-13 ENCOUNTER — Other Ambulatory Visit: Payer: Self-pay

## 2019-05-13 VITALS — Ht 66.0 in | Wt 160.0 lb

## 2019-05-13 DIAGNOSIS — K269 Duodenal ulcer, unspecified as acute or chronic, without hemorrhage or perforation: Secondary | ICD-10-CM

## 2019-05-13 DIAGNOSIS — R1013 Epigastric pain: Secondary | ICD-10-CM

## 2019-05-13 DIAGNOSIS — K259 Gastric ulcer, unspecified as acute or chronic, without hemorrhage or perforation: Secondary | ICD-10-CM

## 2019-05-13 DIAGNOSIS — D509 Iron deficiency anemia, unspecified: Secondary | ICD-10-CM

## 2019-05-13 NOTE — Progress Notes (Signed)
Chief Complaint: FU  Referring Provider:  Lorre Munroe, NP      ASSESSMENT AND PLAN;   #1. 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 Neg HP  #2. IDA likely d/t #1.  Neg colon to TI 04/2019.  #3. Epi pain  Plan; - UGI series (pl eval duodenum as well) - Continue protonix 40 mg p.o. twice daily for now. - Repeat EGD (12 weeks from last proc), to ensure healing of ulcers and to evaluate duodenum. - Check CBC, CMP and lipase - Avoid NSAIDs - If still with problems and the above WU is negative, proceed with CT scan abdo/pelvis.    HPI:    Crystal Morris is a 53 y.o. female  Feels much better No further melena Abdominal pain is better.  Still occasionally would have epigastric discomfort especially after eating.  No nausea, vomiting, heartburn, regurgitation, odynophagia or dysphagia.  No significant diarrhea or constipation.  No melena or hematochezia. No unintentional weight loss. No abdominal pain.  Has gained weight which she had lost previously.  No nonsteroidals anymore.  Has been compliant with medications.  Hb has improved from 9.6 to 11.4 as below.   Past Medical History:  Diagnosis Date  . History of chicken pox   . Medical history non-contributory   . Wears glasses     Past Surgical History:  Procedure Laterality Date  . CESAREAN SECTION    . CLOSED REDUCTION METACARPAL WITH PERCUTANEOUS PINNING Left 07/28/2014   Procedure: CLOSED REDUCTION METACARPAL WITH PERCUTANEOUS PINNING PROXIMAL PHALANX FRACTURES LEFT MIDDLE RING AND SMALL FINGERS;  Surgeon: Betha Loa, MD;  Location: [facility];  Service: Orthopedics;  Laterality: Left;  . COLONOSCOPY     2016  . HEMORROIDECTOMY    . INTRAUTERINE DEVICE INSERTION      Family History  Problem Relation Age of Onset  . Colon cancer Neg Hx   . Esophageal cancer Neg Hx   . Rectal cancer Neg Hx   . Stomach cancer Neg Hx     Social History   Tobacco Use  .  Smoking status: Never Smoker  . Smokeless tobacco: Never Used  Substance Use Topics  . Alcohol use: No  . Drug use: No    Current Outpatient Medications  Medication Sig Dispense Refill  . aspirin-acetaminophen-caffeine (EXCEDRIN MIGRAINE) 250-250-65 MG tablet Take 1 tablet by mouth every 6 (six) hours as needed for headache.    . diphenhydrAMINE (BENADRYL) 25 mg capsule Take 25 mg by mouth at bedtime as needed for itching, allergies or sleep.    Marland Kitchen escitalopram (LEXAPRO) 20 MG tablet Take 1 tablet (20 mg total) by mouth daily. MUST SCHEDULE PHYSICAL EXAM 90 tablet 0  . famotidine (PEPCID) 20 MG tablet Take 1 tablet (20 mg total) by mouth daily. Take Pepcid 20 mg nightly 30 tablet 4  . fluticasone (FLONASE) 50 MCG/ACT nasal spray Place 2 sprays into both nostrils daily. 16 g 6  . pantoprazole (PROTONIX) 40 MG tablet Take 1 tablet (40 mg total) by mouth 2 (two) times daily. 60 tablet 12  . Polyethyl Glycol-Propyl Glycol (SYSTANE OP) Place 1 drop into both eyes daily.     Marland Kitchen PREMPRO 0.3-1.5 MG tablet Take 1 tablet by mouth daily. MUST SCHEDULE PHYSICAL 84 tablet 0  . zolpidem (AMBIEN CR) 12.5 MG CR tablet Take 1 tablet (12.5 mg total) by mouth at bedtime. 30 tablet 0   No current facility-administered medications for this visit.  Allergies  Allergen Reactions  . Oxycodone Itching    Patient said that she doesn't have problems with it 05/13/2019    Review of Systems:  neg     Physical Exam:    Ht 5\' 6"  (1.676 m)   Wt 160 lb (72.6 kg)   BMI 25.82 kg/m  Wt Readings from Last 3 Encounters:  05/13/19 160 lb (72.6 kg)  04/14/19 150 lb (68 kg)  04/10/19 150 lb (68 kg)   Not examined since it was a televisit.  Data Reviewed: I have personally reviewed following labs and imaging studies  CBC: CBC Latest Ref Rng & Units 04/10/2019 03/28/2019 03/26/2019  WBC 4.0 - 10.5 K/uL 4.9 6.4 5.9  Hemoglobin 12.0 - 15.0 g/dL 11.4(L) 9.6(L) 8.6(L)  Hematocrit 36.0 - 46.0 % 37.5 33.7(L)  30.3(L)  Platelets 150.0 - 400.0 K/uL 369.0 410(H) 435(H)    CMP: CMP Latest Ref Rng & Units 03/28/2019 04/03/2018 03/23/2015  Glucose 70 - 99 mg/dL 108(H) 78 86  BUN 6 - 20 mg/dL 14 15 19.1  Creatinine 0.44 - 1.00 mg/dL 1.20(H) 0.98 1.0  Sodium 135 - 145 mmol/L 136 138 138  Potassium 3.5 - 5.1 mmol/L 3.6 3.7 4.9  Chloride 98 - 111 mmol/L 101 102 -  CO2 22 - 32 mmol/L 24 31 25   Calcium 8.9 - 10.3 mg/dL 9.5 8.9 8.7  Total Protein 6.5 - 8.1 g/dL 7.9 7.0 7.6  Total Bilirubin 0.3 - 1.2 mg/dL 0.6 0.4 <0.30  Alkaline Phos 38 - 126 U/L 56 38(L) 48  AST 15 - 41 U/L 24 18 26   ALT 0 - 44 U/L 17 11 31      Radiology Studies: Mm 3d Screen Breast Bilateral  Result Date: 04/17/2019 CLINICAL DATA:  Screening. EXAM: DIGITAL SCREENING BILATERAL MAMMOGRAM WITH TOMO AND CAD COMPARISON:  Previous exam(s). ACR Breast Density Category b: There are scattered areas of fibroglandular density. FINDINGS: There are no findings suspicious for malignancy. Images were processed with CAD. IMPRESSION: No mammographic evidence of malignancy. A result letter of this screening mammogram will be mailed directly to the patient. RECOMMENDATION: Screening mammogram in one year. (Code:SM-B-01Y) BI-RADS CATEGORY  1: Negative. Electronically Signed   By: Abelardo Diesel M.D.   On: 04/17/2019 14:07   I connected with  Ida Gorum on 05/13/19 by a video enabled telemedicine application and verified that I am speaking with the correct person using two identifiers.   I discussed the limitations of evaluation and management by telemedicine. The patient expressed understanding and agreed to proceed.  Time spent on call/coordination of care/including discussion with family/scheduling: 25 min   Carmell Austria, MD 05/13/2019, 9:19 AM  Cc: Jearld Fenton, NP

## 2019-05-13 NOTE — Patient Instructions (Signed)
If you are age 54 or older, your body mass index should be between 23-30. Your Body mass index is 25.82 kg/m. If this is out of the aforementioned range listed, please consider follow up with your Primary Care Provider.  If you are age 40 or younger, your body mass index should be between 19-25. Your Body mass index is 25.82 kg/m. If this is out of the aformentioned range listed, please consider follow up with your Primary Care Provider.   You have been scheduled for an Upper GI Series and Small Bowel Follow Thru at [facility]. Your appointment is on 05/19/19 at 10:30am. Please arrive 15 minutes prior to your test for registration. Make certain not to have anything to eat or drink after midnight on the night before your test. If you need to reschedule, please contact radiology at [phone]. --------------------------------------------------------------------------------------------------------------- An upper GI series uses x rays to help diagnose problems of the upper GI tract, which includes the esophagus, stomach, and duodenum. The duodenum is the first part of the small intestine. An upper GI series is conducted by a radiology technologist or a radiologist-a doctor who specializes in x-ray imaging-at a hospital or outpatient center. While sitting or standing in front of an x-ray machine, the patient drinks barium liquid, which is often white and has a chalky consistency and taste. The barium liquid coats the lining of the upper GI tract and makes signs of disease show up more clearly on x rays. X-ray video, called fluoroscopy, is used to view the barium liquid moving through the esophagus, stomach, and duodenum. Additional x rays and fluoroscopy are performed while the patient lies on an x-ray table. To fully coat the upper GI tract with barium liquid, the technologist or radiologist may press on the abdomen or ask the patient to change position. Patients hold still in various positions, allowing  the technologist or radiologist to take x rays of the upper GI tract at different angles. If a technologist conducts the upper GI series, a radiologist will later examine the images to look for problems.  This test typically takes about 1 hour to complete --------------------------------------------------------------------------------------------------------------------------------------------- The Small Bowel Follow Thru examination is used to visualize the entire small bowel (intestines); specifically the connection between the small and large intestine. You will be positioned on a flat x-ray table and an image of your abdomen taken. Then the technologist will show the x-ray to the radiologist. The radiologist will instruct your technologist how much (1-2 cups) barium sulfate you will drink and when to begin taking the timed x-rays, usually 15-30 minutes after you begin drinking. Barium is a harmless substance that will highlight your small intestine by absorbing x-ray. The taste is chalky and it feels very heavy both in the cup and in your stomach.  After the first x-ray is taken and shown to the radiologist, he/she will determine when the next image is to be taken. This is repeated until the barium has reached the end of the small intestine and enters the beginning of the colon (cecum). At such time when the barium spills into the colon, you will be positioned on the x-ray table once again. The radiologist will use a fluoroscopic camera to take some detailed pictures of the connection between your small intestine and colon. The fluoroscope is an x-ray unit that works with a television/computer screen. The radiologist will apply pressure to your abdomen with his/her hand and a lead glove, a plastic paddle, or a paddle with an inflated rubber balloon  on the end. This is to spread apart your loops of intestine so he/she can see all areas.   This test typically takes around 1 hour to complete.  Important   Drink plenty of water (8-10 cups/day) for a few days following the procedure to avoid constipation and blockage. The barium will make your stools white for a few days. --------------------------------------------------------------------------------------------------------------------------------------------  Please go to the lab at [facility] ([place] You will need to go to level "B", you do not need an appointment for this. Hours available are 7:30 am - 4:30 pm.   Avoid NSAIDS.   You will be due for a recall EGD in 07/2019. We will send you a reminder in the mail when it gets closer to that time.  Thank you,  Dr. Jackquline Denmark

## 2019-05-19 ENCOUNTER — Other Ambulatory Visit: Payer: Self-pay | Admitting: Internal Medicine

## 2019-05-19 ENCOUNTER — Ambulatory Visit (HOSPITAL_COMMUNITY)
Admission: RE | Admit: 2019-05-19 | Discharge: 2019-05-19 | Disposition: A | Discharge status: Home / Self Care | Payer: No Typology Code available for payment source | Source: Ambulatory Visit | Attending: Gastroenterology | Admitting: Gastroenterology

## 2019-05-19 ENCOUNTER — Other Ambulatory Visit: Payer: Self-pay

## 2019-05-19 DIAGNOSIS — K259 Gastric ulcer, unspecified as acute or chronic, without hemorrhage or perforation: Secondary | ICD-10-CM | POA: Insufficient documentation

## 2019-05-19 DIAGNOSIS — R1013 Epigastric pain: Secondary | ICD-10-CM | POA: Insufficient documentation

## 2019-05-19 DIAGNOSIS — D509 Iron deficiency anemia, unspecified: Secondary | ICD-10-CM | POA: Insufficient documentation

## 2019-05-19 DIAGNOSIS — F5101 Primary insomnia: Secondary | ICD-10-CM

## 2019-05-19 DIAGNOSIS — K269 Duodenal ulcer, unspecified as acute or chronic, without hemorrhage or perforation: Secondary | ICD-10-CM | POA: Diagnosis present

## 2019-05-21 NOTE — Telephone Encounter (Signed)
Has appt for CPE tomorrow, will wait until then to refill as she should not be out of Lexapro or Prempro yet

## 2019-05-22 ENCOUNTER — Ambulatory Visit (INDEPENDENT_AMBULATORY_CARE_PROVIDER_SITE_OTHER): Payer: No Typology Code available for payment source | Admitting: Internal Medicine

## 2019-05-22 ENCOUNTER — Encounter: Payer: Self-pay | Admitting: Internal Medicine

## 2019-05-22 ENCOUNTER — Other Ambulatory Visit: Payer: Self-pay

## 2019-05-22 VITALS — BP 126/74 | HR 80 | Temp 97.5°F | Ht 61.5 in | Wt 153.0 lb

## 2019-05-22 DIAGNOSIS — Z23 Encounter for immunization: Secondary | ICD-10-CM

## 2019-05-22 DIAGNOSIS — K219 Gastro-esophageal reflux disease without esophagitis: Secondary | ICD-10-CM | POA: Diagnosis not present

## 2019-05-22 DIAGNOSIS — R519 Headache, unspecified: Secondary | ICD-10-CM

## 2019-05-22 DIAGNOSIS — E559 Vitamin D deficiency, unspecified: Secondary | ICD-10-CM

## 2019-05-22 DIAGNOSIS — D509 Iron deficiency anemia, unspecified: Secondary | ICD-10-CM | POA: Diagnosis not present

## 2019-05-22 DIAGNOSIS — F5101 Primary insomnia: Secondary | ICD-10-CM

## 2019-05-22 DIAGNOSIS — Z0000 Encounter for general adult medical examination without abnormal findings: Secondary | ICD-10-CM | POA: Diagnosis not present

## 2019-05-22 DIAGNOSIS — F39 Unspecified mood [affective] disorder: Secondary | ICD-10-CM

## 2019-05-22 LAB — COMPREHENSIVE METABOLIC PANEL
ALT: 17 U/L (ref 0–35)
AST: 20 U/L (ref 0–37)
Albumin: 4.3 g/dL (ref 3.5–5.2)
Alkaline Phosphatase: 59 U/L (ref 39–117)
BUN: 16 mg/dL (ref 6–23)
CO2: 30 mEq/L (ref 19–32)
Calcium: 9.4 mg/dL (ref 8.4–10.5)
Chloride: 100 mEq/L (ref 96–112)
Creatinine, Ser: 1 mg/dL (ref 0.40–1.20)
GFR: 69.79 mL/min (ref >60.00)
Glucose, Bld: 90 mg/dL (ref 70–99)
Potassium: 3.9 mEq/L (ref 3.5–5.1)
Sodium: 137 mEq/L (ref 135–145)
Total Bilirubin: 0.3 mg/dL (ref 0.2–1.2)
Total Protein: 7.4 g/dL (ref 6.0–8.3)

## 2019-05-22 LAB — CBC
HCT: 37.1 % (ref 36.0–46.0)
Hemoglobin: 11.3 g/dL — ABNORMAL LOW (ref 12.0–15.0)
MCHC: 30.6 g/dL (ref 30.0–36.0)
MCV: 80.4 fl (ref 78.0–100.0)
Platelets: 282 10*3/uL (ref 150.0–400.0)
RBC: 4.62 Mil/uL (ref 3.87–5.11)
RDW: 21 % — ABNORMAL HIGH (ref 11.5–15.5)
WBC: 6.3 10*3/uL (ref 4.0–10.5)

## 2019-05-22 LAB — LIPID PANEL
Cholesterol: 174 mg/dL (ref 0–200)
HDL: 54.3 mg/dL (ref >39.00)
LDL Cholesterol: 106 mg/dL — ABNORMAL HIGH (ref 0–99)
NonHDL: 119.88
Total CHOL/HDL Ratio: 3
Triglycerides: 70 mg/dL (ref 0.0–149.0)
VLDL: 14 mg/dL (ref 0.0–40.0)

## 2019-05-22 LAB — VITAMIN D 25 HYDROXY (VIT D DEFICIENCY, FRACTURES): VITD: 20.84 ng/mL — ABNORMAL LOW (ref 30.00–100.00)

## 2019-05-22 MED ORDER — ZOLPIDEM TARTRATE ER 12.5 MG PO TBCR: 12.5000 mg | EXTENDED_RELEASE_TABLET | Freq: Every day | ORAL | 0 refills | Status: DC

## 2019-05-22 MED ORDER — PREMPRO 0.3-1.5 MG PO TABS: 1.0000 | ORAL_TABLET | Freq: Every day | ORAL | 3 refills | Status: DC

## 2019-05-22 MED ORDER — FAMOTIDINE 20 MG PO TABS: 20.0000 mg | ORAL_TABLET | Freq: Every day | ORAL | 3 refills | Status: DC

## 2019-05-22 MED ORDER — FLUTICASONE PROPIONATE 50 MCG/ACT NA SUSP: 2.0000 | Freq: Every day | NASAL | 6 refills | Status: DC

## 2019-05-22 MED ORDER — PANTOPRAZOLE SODIUM 40 MG PO TBEC: 40.0000 mg | DELAYED_RELEASE_TABLET | Freq: Two times a day (BID) | ORAL | 3 refills | Status: DC

## 2019-05-22 MED ORDER — ESCITALOPRAM OXALATE 20 MG PO TABS: 20.0000 mg | ORAL_TABLET | Freq: Every day | ORAL | 3 refills | Status: DC

## 2019-05-22 MED FILL — ZOLPIDEM TART ER 12.5 MG TA: 12.5 | 30 days supply | Qty: 30 | Fill #0

## 2019-05-22 MED FILL — ESCITALOPRAM 20 MG TABLET: 20 | 90 days supply | Qty: 90 | Fill #0

## 2019-05-22 MED FILL — PREMPRO 0.3 MG-1.5 MG TAB: 0.3-1.5 | 84 days supply | Qty: 84 | Fill #0

## 2019-05-22 MED FILL — FLUTICASONE PROP 50 MCG SPR: 50 | 30 days supply | Qty: 16 | Fill #0

## 2019-05-22 NOTE — Progress Notes (Signed)
Inform patient of results She does have duodenal stricture She must continue taking her medications Avoid all nonsteroidals including aspirin EGD as per last note.  Should be performed in [place] with fluoroscopy.  Also add that she would need balloon dilatation of the duodenal stricture.

## 2019-05-22 NOTE — Assessment & Plan Note (Signed)
CBC today.  

## 2019-05-22 NOTE — Assessment & Plan Note (Signed)
Continue Prempro and Escitalopram Support offered

## 2019-05-22 NOTE — Assessment & Plan Note (Signed)
Continue Benadryl and Ibuprofen as needed Will monitor

## 2019-05-22 NOTE — Progress Notes (Signed)
Subjective:    Patient ID: Crystal Morris, female    DOB: 1966-03-13, 53 y.o.   MRN: [id number]  HPI  Pt presents to the clinic today for her annual exam. She is also due to follow up chronic conditions.  Frequent Headaches: Intermittent, triggered by allergies.She takes Benadryl and Ibuprofen as needed with good relief of symptoms.  Iron Deficiency Anemia: Her last H/H was 11.4/37.5, 04/2019. She is not taking an iron supplement OTC but did have 3 iron infusions. She follows with hematology.  Mood Disorder: Mainly just irritability, triggered by menopause. She is taking Prempro and Escitalopram as prescribed.  Insomnia: She has trouble staying asleep. She takes Ambien nightly with good relief of symptoms.  GERD: With ulcers and a hernia. She has recently seen GI for the same. She is taking Pantoprazole and Famotidine as prescribed. Upper GI from 04/2019 reviewed.  Flu: 03/2018 Tetanus: ?2016 Pap Smear: 04/2017 Mammogram: 04/2019 Colon Screening: 04/2019 Vision Screening: annually Dentist: biannually  Diet: She does eat meat. She consume fruits and veggies daily. She occasionally eats some fried foods. She drinks mostly water. Exercise: Walking, 7 days per week  Review of Systems      Past Medical History:  Diagnosis Date  . History of chicken pox   . Medical history non-contributory   . Wears glasses     Current Outpatient Medications  Medication Sig Dispense Refill  . aspirin-acetaminophen-caffeine (EXCEDRIN MIGRAINE) 250-250-65 MG tablet Take 1 tablet by mouth every 6 (six) hours as needed for headache.    . diphenhydrAMINE (BENADRYL) 25 mg capsule Take 25 mg by mouth at bedtime as needed for itching, allergies or sleep.    Marland Kitchen. escitalopram (LEXAPRO) 20 MG tablet Take 1 tablet (20 mg total) by mouth daily. MUST SCHEDULE PHYSICAL EXAM 90 tablet 0  . famotidine (PEPCID) 20 MG tablet Take 1 tablet (20 mg total) by mouth daily. Take Pepcid 20 mg nightly 30 tablet 4  .  fluticasone (FLONASE) 50 MCG/ACT nasal spray Place 2 sprays into both nostrils daily. 16 g 6  . pantoprazole (PROTONIX) 40 MG tablet Take 1 tablet (40 mg total) by mouth 2 (two) times daily. 60 tablet 12  . Polyethyl Glycol-Propyl Glycol (SYSTANE OP) Place 1 drop into both eyes daily.     Marland Kitchen. PREMPRO 0.3-1.5 MG tablet Take 1 tablet by mouth daily. MUST SCHEDULE PHYSICAL 84 tablet 0  . zolpidem (AMBIEN CR) 12.5 MG CR tablet Take 1 tablet (12.5 mg total) by mouth at bedtime. 30 tablet 0   No current facility-administered medications for this visit.    Allergies  Allergen Reactions  . Oxycodone Itching    Patient said that she doesn't have problems with it 05/13/2019    Family History  Problem Relation Age of Onset  . Colon cancer Neg Hx   . Esophageal cancer Neg Hx   . Rectal cancer Neg Hx   . Stomach cancer Neg Hx     Social History   Socioeconomic History  . Marital status: Married    Spouse name: Not on file  . Number of children: Not on file  . Years of education: Not on file  . Highest education level: Not on file  Occupational History  . Not on file  Tobacco Use  . Smoking status: Never Smoker  . Smokeless tobacco: Never Used  Substance and Sexual Activity  . Alcohol use: No  . Drug use: No  . Sexual activity: Not on file  Other Topics Concern  .  Not on file  Social History Narrative  . Not on file   Social Determinants of Health   Financial Resource Strain:   . Difficulty of Paying Living Expenses: Not on file  Food Insecurity:   . Worried About Programme researcher, broadcasting/film/video in the Last Year: Not on file  . Ran Out of Food in the Last Year: Not on file  Transportation Needs:   . Lack of Transportation (Medical): Not on file  . Lack of Transportation (Non-Medical): Not on file  Physical Activity:   . Days of Exercise per Week: Not on file  . Minutes of Exercise per Session: Not on file  Stress:   . Feeling of Stress : Not on file  Social Connections:   . Frequency  of Communication with Friends and Family: Not on file  . Frequency of Social Gatherings with Friends and Family: Not on file  . Attends Religious Services: Not on file  . Active Member of Clubs or Organizations: Not on file  . Attends Banker Meetings: Not on file  . Marital Status: Not on file  Intimate Partner Violence:   . Fear of Current or Ex-Partner: Not on file  . Emotionally Abused: Not on file  . Physically Abused: Not on file  . Sexually Abused: Not on file     Constitutional: Pt reports intermittent headaches. Denies fever, malaise, fatigue,  or abrupt weight changes.  HEENT: Denies eye pain, eye redness, ear pain, ringing in the ears, wax buildup, runny nose, nasal congestion, bloody nose, or sore throat. Respiratory: Denies difficulty breathing, shortness of breath, cough or sputum production.   Cardiovascular: Denies chest pain, chest tightness, palpitations or swelling in the hands or feet.  Gastrointestinal: Pt reports reflux. Denies abdominal pain, bloating, constipation, diarrhea or blood in the stool.  GU: Denies urgency, frequency, pain with urination, burning sensation, blood in urine, odor or discharge. Musculoskeletal: Denies decrease in range of motion, difficulty with gait, muscle pain or joint pain and swelling.  Skin: Denies redness, rashes, lesions or ulcercations.  Neurological: Pt reports insomnia. Denies dizziness, difficulty with memory, difficulty with speech or problems with balance and coordination.  Psych: Pt reports irritability. Denies anxiety, depression, SI/HI.  No other specific complaints in a complete review of systems (except as listed in HPI above).  Objective:   Physical Exam  BP 126/74   Pulse 80   Temp (!) 97.5 F (36.4 C) (Temporal)   Ht 5' 1.5" (1.562 m)   Wt 153 lb (69.4 kg)   SpO2 98%   BMI 28.44 kg/m   Wt Readings from Last 3 Encounters:  05/13/19 160 lb (72.6 kg)  04/14/19 150 lb (68 kg)  04/10/19 150 lb (68  kg)    General: Appears her stated age, well developed, well nourished in NAD. Skin: Warm, dry and intact. No rashes noted. HEENT: Head: normal shape and size; Eyes: sclera white, no icterus, conjunctiva pink, PERRLA and EOMs intact;  Neck:  Neck supple, trachea midline. No masses, lumps or thyromegaly present.  Cardiovascular: Normal rate and rhythm. S1,S2 noted.  No murmur, rubs or gallops noted. No JVD or BLE edema. No carotid bruits noted. Pulmonary/Chest: Normal effort and positive vesicular breath sounds. No respiratory distress. No wheezes, rales or ronchi noted.  Abdomen: Soft and nontender. Normal bowel sounds. No distention or masses noted. Liver, spleen and kidneys non palpable. Musculoskeletal: Strength 5/5 BUE/BLE No difficulty with gait.  Neurological: Alert and oriented. Cranial nerves II-XII grossly intact. Coordination  normal.  Psychiatric: Mood and affect normal. Behavior is normal. Judgment and thought content normal.    BMET    Component Value Date/Time   NA 136 03/28/2019 1826   NA 138 03/23/2015 0808   K 3.6 03/28/2019 1826   K 4.9 03/23/2015 0808   CL 101 03/28/2019 1826   CO2 24 03/28/2019 1826   CO2 25 03/23/2015 0808   GLUCOSE 108 (H) 03/28/2019 1826   GLUCOSE 86 03/23/2015 0808   BUN 14 03/28/2019 1826   BUN 19.1 03/23/2015 0808   CREATININE 1.20 (H) 03/28/2019 1826   CREATININE 1.0 03/23/2015 0808   CALCIUM 9.5 03/28/2019 1826   CALCIUM 8.7 03/23/2015 0808   GFRNONAA 51 (L) 03/28/2019 1826   GFRAA 59 (L) 03/28/2019 1826    Lipid Panel     Component Value Date/Time   CHOL 156 04/03/2018 1442   TRIG 148.0 04/03/2018 1442   HDL 52.30 04/03/2018 1442   CHOLHDL 3 04/03/2018 1442   VLDL 29.6 04/03/2018 1442   LDLCALC 74 04/03/2018 1442    CBC    Component Value Date/Time   WBC 4.9 04/10/2019 1103   RBC 4.91 04/10/2019 1103   HGB 11.4 (L) 04/10/2019 1103   HGB 8.6 (L) 03/26/2019 0818   HGB 11.6 03/23/2015 0808   HCT 37.5 04/10/2019 1103    HCT 37.4 03/23/2015 0808   PLT 369.0 04/10/2019 1103   PLT 435 (H) 03/26/2019 0818   PLT 302 03/23/2015 0808   MCV 76.3 (L) 04/10/2019 1103   MCV 86.2 03/23/2015 0808   MCH 22.0 (L) 03/28/2019 1826   MCHC 30.3 04/10/2019 1103   RDW 28.9 (H) 04/10/2019 1103   RDW 13.0 03/23/2015 0808   LYMPHSABS 1.7 03/26/2019 0818   LYMPHSABS 1.5 03/23/2015 0808   MONOABS 0.5 03/26/2019 0818   MONOABS 0.3 03/23/2015 0808   EOSABS 0.1 03/26/2019 0818   EOSABS 0.0 03/23/2015 0808   BASOSABS 0.0 03/26/2019 0818   BASOSABS 0.0 03/23/2015 0808    Hgb A1C No results found for: HGBA1C          Assessment & Plan:   Preventative Health Maintenance:  Flu shot UTD Tetanus UTD per her report Pap smear UTD Mammogram UTD Colon screening UTD Encouraged her to consume a balanced diet and exercise regimen Advised her to see an eye doctor and dentist annually Will check CBC, CMET, Lipid and Vit D today  RTC in 1 year, sooner if needed Webb Silversmith, NP This visit occurred during the SARS-CoV-2 public health emergency.  Safety protocols were in place, including screening questions prior to the visit, additional usage of staff PPE, and extensive cleaning of exam room while observing appropriate contact time as indicated for disinfecting solutions.

## 2019-05-22 NOTE — Patient Instructions (Signed)

## 2019-05-22 NOTE — Assessment & Plan Note (Signed)
Continue Pantoprazole and Famotadine CBC and CMET today Will monitor

## 2019-05-22 NOTE — Assessment & Plan Note (Signed)
Continue Ambien CR 12.5 mg  Will monitor

## 2019-05-23 DIAGNOSIS — K259 Gastric ulcer, unspecified as acute or chronic, without hemorrhage or perforation: Secondary | ICD-10-CM

## 2019-05-23 MED ORDER — VITAMIN D (ERGOCALCIFEROL) 1.25 MG (50000 UNIT) PO CAPS: 50000.0000 [IU] | ORAL_CAPSULE | ORAL | 0 refills | Status: DC

## 2019-05-23 MED FILL — VIT D2 1.25 MG (50,000 UNIT: 1.25 MG | 84 days supply | Qty: 12 | Fill #0

## 2019-05-23 NOTE — Addendum Note (Signed)
Addended by: Jearld Fenton on: 05/23/2019 03:20 PM   Modules accepted: Orders

## 2019-05-23 NOTE — Addendum Note (Signed)
Addended by: Mohammed Kindle on: 05/23/2019 10:15 AM   Modules accepted: Orders

## 2019-05-28 ENCOUNTER — Encounter: Payer: Self-pay | Admitting: Internal Medicine

## 2019-05-29 MED FILL — PANTOPRAZOLE SOD DR 40 MG T: 40 | 90 days supply | Qty: 180 | Fill #0

## 2019-06-20 ENCOUNTER — Other Ambulatory Visit: Payer: Self-pay | Admitting: Internal Medicine

## 2019-06-20 DIAGNOSIS — F5101 Primary insomnia: Secondary | ICD-10-CM

## 2019-06-20 MED FILL — FAMOTIDINE 20 MG TABS: 20 | 30 days supply | Qty: 30 | Fill #0

## 2019-06-20 NOTE — Telephone Encounter (Signed)
Last filled 05/22/2019... please advise  

## 2019-06-23 MED FILL — ZOLPIDEM TART ER 12.5 MG TA: 12.5 | 30 days supply | Qty: 30 | Fill #0

## 2019-06-26 ENCOUNTER — Inpatient Hospital Stay: Payer: No Typology Code available for payment source

## 2019-06-26 ENCOUNTER — Other Ambulatory Visit: Payer: Self-pay

## 2019-06-26 ENCOUNTER — Inpatient Hospital Stay: Payer: No Typology Code available for payment source | Attending: Oncology | Admitting: Oncology

## 2019-06-26 VITALS — BP 162/99 | HR 88 | Temp 98.0°F | Resp 18 | Ht 61.5 in | Wt 157.6 lb

## 2019-06-26 DIAGNOSIS — D508 Other iron deficiency anemias: Secondary | ICD-10-CM

## 2019-06-26 DIAGNOSIS — D509 Iron deficiency anemia, unspecified: Secondary | ICD-10-CM | POA: Insufficient documentation

## 2019-06-26 DIAGNOSIS — Z79899 Other long term (current) drug therapy: Secondary | ICD-10-CM | POA: Insufficient documentation

## 2019-06-26 DIAGNOSIS — Z7982 Long term (current) use of aspirin: Secondary | ICD-10-CM | POA: Insufficient documentation

## 2019-06-26 LAB — CBC WITH DIFFERENTIAL (CANCER CENTER ONLY)
Abs Immature Granulocytes: 0.01 10*3/uL (ref 0.00–0.07)
Basophils Absolute: 0 10*3/uL (ref 0.0–0.1)
Basophils Relative: 0 %
Eosinophils Absolute: 0.1 10*3/uL (ref 0.0–0.5)
Eosinophils Relative: 1 %
HCT: 36.6 % (ref 36.0–46.0)
Hemoglobin: 11.5 g/dL — ABNORMAL LOW (ref 12.0–15.0)
Immature Granulocytes: 0 %
Lymphocytes Relative: 39 %
Lymphs Abs: 2 10*3/uL (ref 0.7–4.0)
MCH: 25.5 pg — ABNORMAL LOW (ref 26.0–34.0)
MCHC: 31.4 g/dL (ref 30.0–36.0)
MCV: 81.2 fL (ref 80.0–100.0)
Monocytes Absolute: 0.5 10*3/uL (ref 0.1–1.0)
Monocytes Relative: 11 %
Neutro Abs: 2.4 10*3/uL (ref 1.7–7.7)
Neutrophils Relative %: 49 %
Platelet Count: 274 10*3/uL (ref 150–400)
RBC: 4.51 MIL/uL (ref 3.87–5.11)
RDW: 14 % (ref 11.5–15.5)
WBC Count: 5 10*3/uL (ref 4.0–10.5)
nRBC: 0 % (ref 0.0–0.2)

## 2019-06-26 LAB — FERRITIN: Ferritin: 142 ng/mL (ref 11–307)

## 2019-06-26 LAB — IRON AND TIBC
Iron: 81 ug/dL (ref 41–142)
Saturation Ratios: 21 % (ref 21–57)
TIBC: 389 ug/dL (ref 236–444)
UIBC: 308 ug/dL (ref 120–384)

## 2019-06-26 NOTE — Progress Notes (Signed)
Hematology and Oncology Follow Up Visit  Crystal Morris [id number] 03-Apr-1966 53 y.o. 06/26/2019 8:44 AM  Principle Diagnosis: 54 year old woman with anemia diagnosed in 2016.  She was found to have iron deficiency related to poor iron absorption and chronic menstrual blood losses.  Prior Therapy:   Feraheme at total of 1 g completed on 09/23/2014. She received 510 mg on 01/14/2015.  Feraheme repeat infusion completed in October 2020.   Current therapy: Repeat intravenous iron as needed.  Interim History: Ms. Henly is here for return evaluation.  Since her last visit, she received intravenous iron in October 2020 without complications.  She also completed the colonoscopy and endoscopy November 2020.  He reports of feeling reasonably well at this time without any recent hospitalization or illnesses.  She denies any hematochezia or melena.  She denies any abdominal pain or flank pain.       Medications: Reviewed without changes. Current Outpatient Medications  Medication Sig Dispense Refill  . aspirin-acetaminophen-caffeine (EXCEDRIN MIGRAINE) 250-250-65 MG tablet Take by mouth every 6 (six) hours as needed for headache.    . diphenhydrAMINE (BENADRYL) 25 mg capsule Take 25 mg by mouth at bedtime as needed for itching, allergies or sleep.    Marland Kitchen escitalopram (LEXAPRO) 20 MG tablet Take 1 tablet (20 mg total) by mouth daily. MUST SCHEDULE PHYSICAL EXAM 90 tablet 3  . famotidine (PEPCID) 20 MG tablet Take 1 tablet (20 mg total) by mouth daily. Take Pepcid 20 mg nightly 90 tablet 3  . fluticasone (FLONASE) 50 MCG/ACT nasal spray Place 2 sprays into both nostrils daily. 16 g 6  . pantoprazole (PROTONIX) 40 MG tablet Take 1 tablet (40 mg total) by mouth 2 (two) times daily. 180 tablet 3  . Polyethyl Glycol-Propyl Glycol (SYSTANE OP) Place 1 drop into both eyes daily.     Marland Kitchen PREMPRO 0.3-1.5 MG tablet Take 1 tablet by mouth daily. MUST SCHEDULE PHYSICAL 84 tablet 3  . Vitamin D, Ergocalciferol,  (DRISDOL) 1.25 MG (50000 UT) CAPS capsule Take 1 capsule (50,000 Units total) by mouth every 7 (seven) days. 12 capsule 0  . zolpidem (AMBIEN CR) 12.5 MG CR tablet TAKE 1 TABLET (12.5 MG TOTAL) BY MOUTH AT BEDTIME. 30 tablet 0   No current facility-administered medications for this visit.     Allergies:  Allergies  Allergen Reactions  . Oxycodone Itching    Patient said that she doesn't have problems with it 05/13/2019       Physical Exam:  Blood pressure (!) 162/99, pulse 88, temperature 98 F (36.7 C), temperature source Temporal, resp. rate 18, height 5' 1.5" (1.562 m), weight 157 lb 9.6 oz (71.5 kg), SpO2 100 %.   ECOG: 0     General appearance: Alert, awake without any distress. Head: Atraumatic without abnormalities Oropharynx: Without any thrush or ulcers. Eyes: No scleral icterus. Lymph nodes: No lymphadenopathy noted in the cervical, supraclavicular, or axillary nodes Heart:regular rate and rhythm, without any murmurs or gallops.   Lung: Clear to auscultation without any rhonchi, wheezes or dullness to percussion. Abdomin: Soft, nontender without any shifting dullness or ascites. Musculoskeletal: No clubbing or cyanosis. Neurological: No motor or sensory deficits. Skin: No rashes or lesions.            Lab Results: Lab Results  Component Value Date   WBC 6.3 05/22/2019   HGB 11.3 (L) 05/22/2019   HCT 37.1 05/22/2019   MCV 80.4 05/22/2019   PLT 282.0 05/22/2019     Chemistry  Component Value Date/Time   NA 137 05/22/2019 1433   NA 138 03/23/2015 0808   K 3.9 05/22/2019 1433   K 4.9 03/23/2015 0808   CL 100 05/22/2019 1433   CO2 30 05/22/2019 1433   CO2 25 03/23/2015 0808   BUN 16 05/22/2019 1433   BUN 19.1 03/23/2015 0808   CREATININE 1.00 05/22/2019 1433   CREATININE 1.0 03/23/2015 0808      Component Value Date/Time   CALCIUM 9.4 05/22/2019 1433   CALCIUM 8.7 03/23/2015 0808   ALKPHOS 59 05/22/2019 1433   ALKPHOS 48 03/23/2015  0808   AST 20 05/22/2019 1433   AST 26 03/23/2015 0808   ALT 17 05/22/2019 1433   ALT 31 03/23/2015 0808   BILITOT 0.3 05/22/2019 1433   BILITOT <0.30 03/23/2015 0808       Impression and Plan:  54 year old woman with   1.  Anemia related to iron deficiency diagnosed in 2016.  He is status post intravenous iron done on few occasions.   Laboratory data obtained in December 2020 showed improvement in her hemoglobin level is currently 11.3.  Treatment options were reviewed today including continuing oral iron replacement, repeat intravenous iron as well as active surveillance.  Her hemoglobin continues to improve at this time currently at 11.5 with iron studies pending.     2. Fatigue and tiredness: Resolved at this time after improving her iron studies.  3. Colon cancer screening: She is up-to-date after completing a colonoscopy in November 2020.  This will be repeated in 10 years.  4.  Follow-up: In 6 months for repeat evaluation.  20  minutes was spent on this encounter today.  The time was dedicated to reviewing laboratory data, treatment options as well as answering questions regarding future plan of care.      Zola Button, MD 1/21/20218:44 AM

## 2019-06-27 ENCOUNTER — Other Ambulatory Visit (HOSPITAL_COMMUNITY): Payer: No Typology Code available for payment source

## 2019-06-27 ENCOUNTER — Telehealth: Payer: Self-pay | Admitting: Oncology

## 2019-06-27 NOTE — Telephone Encounter (Signed)
Scheduled appt per 1/21 los.  Was not able to get in contact pt.  Sent a message to HIM pool to get a calendar mailed out. 

## 2019-07-01 ENCOUNTER — Encounter (HOSPITAL_COMMUNITY): Payer: Self-pay

## 2019-07-01 ENCOUNTER — Ambulatory Visit (HOSPITAL_COMMUNITY): Admit: 2019-07-01 | Payer: No Typology Code available for payment source | Admitting: Gastroenterology

## 2019-07-01 SURGERY — ESOPHAGOGASTRODUODENOSCOPY (EGD) WITH PROPOFOL
Anesthesia: Monitor Anesthesia Care

## 2019-07-03 MED FILL — VIT D2 1.25 MG (50,000 UNIT: 1.25 MG | 84 days supply | Qty: 12 | Fill #0

## 2019-07-23 ENCOUNTER — Other Ambulatory Visit: Payer: Self-pay | Admitting: Internal Medicine

## 2019-07-23 DIAGNOSIS — F5101 Primary insomnia: Secondary | ICD-10-CM

## 2019-07-23 MED FILL — ZOLPIDEM TART ER 12.5 MG TA: 12.5 | 30 days supply | Qty: 30 | Fill #0

## 2019-07-23 MED FILL — FAMOTIDINE 20 MG TABLET: 20 | 30 days supply | Qty: 30 | Fill #1

## 2019-07-23 NOTE — Telephone Encounter (Signed)
Last filled 06/23/2019.... please advise  

## 2019-08-15 ENCOUNTER — Other Ambulatory Visit: Payer: Self-pay | Admitting: Internal Medicine

## 2019-08-15 DIAGNOSIS — F5101 Primary insomnia: Secondary | ICD-10-CM

## 2019-08-15 MED FILL — PREMPRO 0.3 MG-1.5 MG TAB: 0.3-1.5 | 84 days supply | Qty: 84 | Fill #1

## 2019-08-18 MED FILL — PANTOPRAZOLE SOD DR 40 MG T: 40 | 90 days supply | Qty: 180 | Fill #1

## 2019-08-18 NOTE — Telephone Encounter (Signed)
Last filled 07/23/2019.... please advise  

## 2019-08-19 MED FILL — ZOLPIDEM TART ER 12.5 MG TA: 12.5 | 30 days supply | Qty: 30 | Fill #0

## 2019-08-26 ENCOUNTER — Telehealth: Payer: Self-pay | Admitting: Internal Medicine

## 2019-08-26 NOTE — Telephone Encounter (Signed)
[facility] pharmacy called today. They stated they need approval to refill the patient medication because they sent the patient's medication out for delivery and [employer] lost the medication. Of course it tells them it is to soon for the refill ,.The patient is completely out of medication and they would like to know if there is anyway they can get a refill for the patient.    zolpidem (AMBIEN CR) 12.5 MG CR tablet

## 2019-08-26 NOTE — Telephone Encounter (Signed)
I don't refill controlled substance when they are lost/stolen.

## 2019-08-27 NOTE — Telephone Encounter (Signed)
Pharmacy notified will not fill early cannot be filled until on or after 09/18/2019

## 2019-09-11 ENCOUNTER — Other Ambulatory Visit: Payer: Self-pay | Admitting: Internal Medicine

## 2019-09-11 MED FILL — FLUTICASONE PROP 50 MCG SPR: 50 | 30 days supply | Qty: 16 | Fill #1

## 2019-09-11 MED FILL — FAMOTIDINE 20 MG TABLET: 20 | 30 days supply | Qty: 30 | Fill #2

## 2019-09-18 ENCOUNTER — Other Ambulatory Visit: Payer: Self-pay | Admitting: Internal Medicine

## 2019-09-18 DIAGNOSIS — F5101 Primary insomnia: Secondary | ICD-10-CM

## 2019-09-18 MED FILL — ZOLPIDEM TART ER 12.5 MG TA: 12.5 | 30 days supply | Qty: 30 | Fill #0

## 2019-09-18 MED FILL — ESCITALOPRAM 20 MG TABLET: 20 | 90 days supply | Qty: 90 | Fill #1

## 2019-09-18 NOTE — Telephone Encounter (Signed)
Last filled 08/19/2019...Marland Kitchen please advise

## 2019-10-08 MED FILL — FAMOTIDINE 20 MG TABLET: 20 | 30 days supply | Qty: 30 | Fill #3

## 2019-10-15 ENCOUNTER — Other Ambulatory Visit: Payer: Self-pay | Admitting: Internal Medicine

## 2019-10-15 DIAGNOSIS — F5101 Primary insomnia: Secondary | ICD-10-CM

## 2019-10-16 NOTE — Telephone Encounter (Signed)
Last fill for zolpidem was on 09/15/2019.   Last visit with PCP was 05/22/2019. AVS stated to return in 1year.   No UDS on file.

## 2019-11-18 MED FILL — FAMOTIDINE 20 MG TABLET: 20 | 30 days supply | Qty: 30 | Fill #4

## 2019-11-18 MED FILL — PANTOPRAZOLE SOD DR 40 MG T: 40 | 90 days supply | Qty: 180 | Fill #2

## 2019-11-26 ENCOUNTER — Other Ambulatory Visit: Payer: Self-pay | Admitting: Internal Medicine

## 2019-11-26 DIAGNOSIS — F5101 Primary insomnia: Secondary | ICD-10-CM

## 2019-11-26 NOTE — Telephone Encounter (Signed)
Last filled 10/16/2019... please advise

## 2019-11-27 MED FILL — ZOLPIDEM TART ER 12.5 MG TA: 12.5 | 30 days supply | Qty: 30 | Fill #0

## 2019-12-01 MED FILL — PREMPRO 0.3 MG-1.5 MG TAB: 0.3-1.5 | 84 days supply | Qty: 84 | Fill #2

## 2019-12-23 ENCOUNTER — Other Ambulatory Visit: Payer: Self-pay | Admitting: Internal Medicine

## 2019-12-23 DIAGNOSIS — F5101 Primary insomnia: Secondary | ICD-10-CM

## 2019-12-23 MED FILL — FAMOTIDINE 20 MG TABLET: 20 | 30 days supply | Qty: 30 | Fill #5

## 2019-12-23 NOTE — Telephone Encounter (Signed)
Last filled 11/27/2019... this is Saturday so pt will need to be able to pick up before Rx is due... please advise

## 2019-12-24 ENCOUNTER — Inpatient Hospital Stay: Payer: No Typology Code available for payment source

## 2019-12-24 ENCOUNTER — Inpatient Hospital Stay: Payer: No Typology Code available for payment source | Attending: Oncology | Admitting: Oncology

## 2019-12-25 MED FILL — ZOLPIDEM TART ER 12.5 MG TA: 12.5 | 30 days supply | Qty: 30 | Fill #0

## 2020-01-21 ENCOUNTER — Other Ambulatory Visit: Payer: Self-pay | Admitting: Internal Medicine

## 2020-01-21 DIAGNOSIS — F5101 Primary insomnia: Secondary | ICD-10-CM

## 2020-01-21 MED FILL — FAMOTIDINE 20 MG TABLET: 20 | 30 days supply | Qty: 30 | Fill #6

## 2020-01-21 MED FILL — ESCITALOPRAM 20 MG TABLET: 20 | 90 days supply | Qty: 90 | Fill #2

## 2020-01-21 NOTE — Telephone Encounter (Signed)
Last filled 12/24/2019, to be filled on Sat 8/21, but [facility] pharmacy not open--- TBF on or after 01/23/2020...Marland Kitchen please advise

## 2020-02-04 MED FILL — ZOLPIDEM TART ER 12.5 MG TA: 12.5 | 30 days supply | Qty: 30 | Fill #0

## 2020-03-01 ENCOUNTER — Other Ambulatory Visit: Payer: Self-pay | Admitting: Internal Medicine

## 2020-03-01 DIAGNOSIS — F5101 Primary insomnia: Secondary | ICD-10-CM

## 2020-03-01 MED FILL — FAMOTIDINE 20 MG TABLET: 20 | 30 days supply | Qty: 30 | Fill #7

## 2020-03-01 MED FILL — PREMPRO 0.3 MG-1.5 MG TAB: 0.3-1.5 | 84 days supply | Qty: 84 | Fill #3

## 2020-03-03 MED FILL — ZOLPIDEM TART ER 12.5 MG TA: 12.5 | 30 days supply | Qty: 30 | Fill #0

## 2020-03-03 NOTE — Telephone Encounter (Signed)
Last filled 01/23/2020... please advise

## 2020-04-08 ENCOUNTER — Other Ambulatory Visit: Payer: Self-pay | Admitting: Internal Medicine

## 2020-04-08 DIAGNOSIS — F5101 Primary insomnia: Secondary | ICD-10-CM

## 2020-04-08 MED FILL — FAMOTIDINE 20 MG TABLET: 20 | 90 days supply | Qty: 90 | Fill #8

## 2020-04-08 MED FILL — PANTOPRAZOLE SOD DR 40 MG T: 40 | 90 days supply | Qty: 180 | Fill #1

## 2020-04-08 MED FILL — ESCITALOPRAM 20 MG TABLET: 20 | 90 days supply | Qty: 90 | Fill #3

## 2020-04-09 ENCOUNTER — Other Ambulatory Visit (HOSPITAL_COMMUNITY): Payer: Self-pay | Admitting: Internal Medicine

## 2020-04-09 MED ORDER — ZOLPIDEM TARTRATE ER 12.5 MG PO TBCR: EXTENDED_RELEASE_TABLET | ORAL | 0 refills | Status: DC

## 2020-04-09 MED FILL — ZOLPIDEM TART ER 12.5 MG TA: 12.5 | 30 days supply | Qty: 30 | Fill #0

## 2020-04-09 NOTE — Telephone Encounter (Signed)
Last filled 03-01-20 #30 Last OV 05-22-19 No Future OV [facility]  Rx was printed by accident. I voided that rx and sent it back to be sent electronic

## 2020-04-20 ENCOUNTER — Encounter: Payer: Self-pay | Admitting: Internal Medicine

## 2020-04-20 ENCOUNTER — Ambulatory Visit (INDEPENDENT_AMBULATORY_CARE_PROVIDER_SITE_OTHER): Payer: No Typology Code available for payment source | Admitting: Internal Medicine

## 2020-04-20 ENCOUNTER — Other Ambulatory Visit: Payer: Self-pay

## 2020-04-20 VITALS — BP 136/88 | HR 87 | Temp 97.0°F | Wt 136.0 lb

## 2020-04-20 DIAGNOSIS — B351 Tinea unguium: Secondary | ICD-10-CM

## 2020-04-20 DIAGNOSIS — Z23 Encounter for immunization: Secondary | ICD-10-CM | POA: Diagnosis not present

## 2020-04-20 NOTE — Progress Notes (Signed)
Subjective:    Patient ID: Crystal Morris, female    DOB: 10-Apr-1966, 54 y.o.   MRN: [id number]  HPI  Pt presents to the clinic today with c/o fungal infection of her great toenail. She noticed this about 1 year. She has tried OTC nail fungus treatment without any relief. She would like to have the toenail removed.  Review of Systems      Past Medical History:  Diagnosis Date  . History of chicken pox   . Medical history non-contributory   . Wears glasses     Current Outpatient Medications  Medication Sig Dispense Refill  . aspirin-acetaminophen-caffeine (EXCEDRIN MIGRAINE) 250-250-65 MG tablet Take by mouth every 6 (six) hours as needed for headache.    . diphenhydrAMINE (BENADRYL) 25 mg capsule Take 25 mg by mouth at bedtime as needed for itching, allergies or sleep.    Marland Kitchen escitalopram (LEXAPRO) 20 MG tablet Take 1 tablet (20 mg total) by mouth daily. MUST SCHEDULE PHYSICAL EXAM 90 tablet 3  . famotidine (PEPCID) 20 MG tablet Take 1 tablet (20 mg total) by mouth daily. Take Pepcid 20 mg nightly 90 tablet 3  . fluticasone (FLONASE) 50 MCG/ACT nasal spray Place 2 sprays into both nostrils daily. 16 g 6  . pantoprazole (PROTONIX) 40 MG tablet Take 1 tablet (40 mg total) by mouth 2 (two) times daily. 180 tablet 3  . Polyethyl Glycol-Propyl Glycol (SYSTANE OP) Place 1 drop into both eyes daily.     Marland Kitchen PREMPRO 0.3-1.5 MG tablet Take 1 tablet by mouth daily. 84 tablet 0  . Vitamin D, Ergocalciferol, (DRISDOL) 1.25 MG (50000 UT) CAPS capsule Take 1 capsule (50,000 Units total) by mouth every 7 (seven) days. 12 capsule 0  . zolpidem (AMBIEN CR) 12.5 MG CR tablet TAKE 1 TABLET BY MOUTH EVERY EVENING AT BEDTIME 30 tablet 0   No current facility-administered medications for this visit.    Allergies  Allergen Reactions  . Oxycodone Itching    Patient said that she doesn't have problems with it 05/13/2019    Family History  Problem Relation Age of Onset  . Colon cancer Neg Hx   .  Esophageal cancer Neg Hx   . Rectal cancer Neg Hx   . Stomach cancer Neg Hx     Social History   Socioeconomic History  . Marital status: Married    Spouse name: Not on file  . Number of children: Not on file  . Years of education: Not on file  . Highest education level: Not on file  Occupational History  . Not on file  Tobacco Use  . Smoking status: Never Smoker  . Smokeless tobacco: Never Used  Vaping Use  . Vaping Use: Never used  Substance and Sexual Activity  . Alcohol use: No  . Drug use: No  . Sexual activity: Not on file  Other Topics Concern  . Not on file  Social History Narrative  . Not on file   Social Determinants of Health   Financial Resource Strain:   . Difficulty of Paying Living Expenses: Not on file  Food Insecurity:   . Worried About Programme researcher, broadcasting/film/video in the Last Year: Not on file  . Ran Out of Food in the Last Year: Not on file  Transportation Needs:   . Lack of Transportation (Medical): Not on file  . Lack of Transportation (Non-Medical): Not on file  Physical Activity:   . Days of Exercise per Week: Not on file  .  Minutes of Exercise per Session: Not on file  Stress:   . Feeling of Stress : Not on file  Social Connections:   . Frequency of Communication with Friends and Family: Not on file  . Frequency of Social Gatherings with Friends and Family: Not on file  . Attends Religious Services: Not on file  . Active Member of Clubs or Organizations: Not on file  . Attends Banker Meetings: Not on file  . Marital Status: Not on file  Intimate Partner Violence:   . Fear of Current or Ex-Partner: Not on file  . Emotionally Abused: Not on file  . Physically Abused: Not on file  . Sexually Abused: Not on file     Constitutional: Denies fever, malaise, fatigue, headache or abrupt weight changes.  Respiratory: Denies difficulty breathing, shortness of breath, cough or sputum production.   Cardiovascular: Denies chest pain, chest  tightness, palpitations or swelling in the hands or feet.  Skin: Pt reports fungal infection of left great toenail. Denies redness, rashes, lesions or ulcercations.    No other specific complaints in a complete review of systems (except as listed in HPI above).  Objective:   Physical Exam  BP 136/88   Pulse 87   Temp (!) 97 F (36.1 C) (Temporal)   Wt 136 lb (61.7 kg)   SpO2 98%   BMI 25.28 kg/m   Wt Readings from Last 3 Encounters:  06/26/19 157 lb 9.6 oz (71.5 kg)  05/22/19 153 lb (69.4 kg)  05/13/19 160 lb (72.6 kg)    General: Appears her stated age, well developed, well nourished in NAD. Skin: Fungal infection of left great toenail. Musculoskeletal: No difficulty with gait.  Neurological: Alert and oriented.   BMET    Component Value Date/Time   NA 137 05/22/2019 1433   NA 138 03/23/2015 0808   K 3.9 05/22/2019 1433   K 4.9 03/23/2015 0808   CL 100 05/22/2019 1433   CO2 30 05/22/2019 1433   CO2 25 03/23/2015 0808   GLUCOSE 90 05/22/2019 1433   GLUCOSE 86 03/23/2015 0808   BUN 16 05/22/2019 1433   BUN 19.1 03/23/2015 0808   CREATININE 1.00 05/22/2019 1433   CREATININE 1.0 03/23/2015 0808   CALCIUM 9.4 05/22/2019 1433   CALCIUM 8.7 03/23/2015 0808   GFRNONAA 51 (L) 03/28/2019 1826   GFRAA 59 (L) 03/28/2019 1826    Lipid Panel     Component Value Date/Time   CHOL 174 05/22/2019 1433   TRIG 70.0 05/22/2019 1433   HDL 54.30 05/22/2019 1433   CHOLHDL 3 05/22/2019 1433   VLDL 14.0 05/22/2019 1433   LDLCALC 106 (H) 05/22/2019 1433    CBC    Component Value Date/Time   WBC 5.0 06/26/2019 0836   WBC 6.3 05/22/2019 1433   RBC 4.51 06/26/2019 0836   HGB 11.5 (L) 06/26/2019 0836   HGB 11.6 03/23/2015 0808   HCT 36.6 06/26/2019 0836   HCT 37.4 03/23/2015 0808   PLT 274 06/26/2019 0836   PLT 302 03/23/2015 0808   MCV 81.2 06/26/2019 0836   MCV 86.2 03/23/2015 0808   MCH 25.5 (L) 06/26/2019 0836   MCHC 31.4 06/26/2019 0836   RDW 14.0 06/26/2019 0836     RDW 13.0 03/23/2015 0808   LYMPHSABS 2.0 06/26/2019 0836   LYMPHSABS 1.5 03/23/2015 0808   MONOABS 0.5 06/26/2019 0836   MONOABS 0.3 03/23/2015 0808   EOSABS 0.1 06/26/2019 0836   EOSABS 0.0 03/23/2015 1660  BASOSABS 0.0 06/26/2019 0836   BASOSABS 0.0 03/23/2015 0808    Hgb A1C No results found for: HGBA1C         Assessment & Plan:   Toenail Fungus:  She does not want to start Terbinafine Referral to podiatry for toenail removal  Return precautions discussed Nicki Reaper, NP This visit occurred during the SARS-CoV-2 public health emergency.  Safety protocols were in place, including screening questions prior to the visit, additional usage of staff PPE, and extensive cleaning of exam room while observing appropriate contact time as indicated for disinfecting solutions.

## 2020-04-20 NOTE — Patient Instructions (Signed)

## 2020-04-26 ENCOUNTER — Encounter: Payer: Self-pay | Admitting: Podiatry

## 2020-04-26 ENCOUNTER — Other Ambulatory Visit (HOSPITAL_COMMUNITY): Payer: Self-pay | Admitting: Podiatry

## 2020-04-26 ENCOUNTER — Encounter: Payer: Self-pay | Admitting: *Deleted

## 2020-04-26 ENCOUNTER — Other Ambulatory Visit: Payer: Self-pay

## 2020-04-26 ENCOUNTER — Ambulatory Visit (INDEPENDENT_AMBULATORY_CARE_PROVIDER_SITE_OTHER): Payer: No Typology Code available for payment source | Admitting: Podiatry

## 2020-04-26 DIAGNOSIS — L600 Ingrowing nail: Secondary | ICD-10-CM

## 2020-04-26 MED ORDER — NEOMYCIN-POLYMYXIN-HC 1 % OT SOLN: OTIC | 1 refills | Status: DC

## 2020-04-26 MED FILL — NEO/POLYMYXIN/HC EAR SOLN: 3.5-10000-1 | 50 days supply | Qty: 10 | Fill #0

## 2020-04-26 NOTE — Progress Notes (Signed)
Subjective:  Patient ID: Crystal Morris, female    DOB: [birth year],  MRN: [id number] HPI Chief Complaint  Patient presents with  . Nail Problem    Patient presents today for fungal, ingrown nail left hallux and 5th toenail.  She request them be removed today    54 y.o. female presents with the above complaint.   ROS: She denies fever chills nausea vomiting muscle aches pains calf pain back pain chest pain shortness of breath.  Past Medical History:  Diagnosis Date  . History of chicken pox   . Medical history non-contributory   . Wears glasses    Past Surgical History:  Procedure Laterality Date  . CESAREAN SECTION    . CLOSED REDUCTION METACARPAL WITH PERCUTANEOUS PINNING Left 07/28/2014   Procedure: CLOSED REDUCTION METACARPAL WITH PERCUTANEOUS PINNING PROXIMAL PHALANX FRACTURES LEFT MIDDLE RING AND SMALL FINGERS;  Surgeon: Betha Loa, MD;  [facility]:  [facility];  Service: Orthopedics;  Laterality: Left;  . COLONOSCOPY     2016  . HEMORROIDECTOMY    . INTRAUTERINE DEVICE INSERTION      Current Outpatient Medications:  .  Lidocaine HCl 3 % GEL, Apply topically., Disp: , Rfl:  .  aspirin-acetaminophen-caffeine (EXCEDRIN MIGRAINE) 250-250-65 MG tablet, Take by mouth every 6 (six) hours as needed for headache., Disp: , Rfl:  .  diphenhydrAMINE (BENADRYL) 25 mg capsule, Take 25 mg by mouth at bedtime as needed for itching, allergies or sleep., Disp: , Rfl:  .  escitalopram (LEXAPRO) 20 MG tablet, Take 1 tablet (20 mg total) by mouth daily. MUST SCHEDULE PHYSICAL EXAM, Disp: 90 tablet, Rfl: 3 .  famotidine (PEPCID) 20 MG tablet, Take 1 tablet (20 mg total) by mouth daily. Take Pepcid 20 mg nightly, Disp: 90 tablet, Rfl: 3 .  fluticasone (FLONASE) 50 MCG/ACT nasal spray, Place 2 sprays into both nostrils daily., Disp: 16 g, Rfl: 6 .  NEOMYCIN-POLYMYXIN-HYDROCORTISONE (CORTISPORIN) 1 % SOLN OTIC solution, Apply 1-2 drops to toe BID after soaking, Disp: 10 mL, Rfl: 1 .   pantoprazole (PROTONIX) 40 MG tablet, Take 1 tablet (40 mg total) by mouth 2 (two) times daily., Disp: 180 tablet, Rfl: 3 .  Polyethyl Glycol-Propyl Glycol (SYSTANE OP), Place 1 drop into both eyes daily. , Disp: , Rfl:  .  polyethylene glycol powder (GLYCOLAX/MIRALAX) 17 GM/SCOOP powder, Take by mouth., Disp: , Rfl:  .  PREMPRO 0.3-1.5 MG tablet, Take 1 tablet by mouth daily., Disp: 84 tablet, Rfl: 0 .  zolpidem (AMBIEN CR) 12.5 MG CR tablet, TAKE 1 TABLET BY MOUTH EVERY EVENING AT BEDTIME, Disp: 30 tablet, Rfl: 0  Allergies  Allergen Reactions  . Oxycodone Itching    Patient said that she doesn't have problems with it 05/13/2019   Review of Systems Objective:  There were no vitals filed for this visit.  General: Well developed, nourished, in no acute distress, alert and oriented x3   Dermatological: Skin is warm, dry and supple bilateral. Nails x 10 are well maintained; remaining integument appears unremarkable at this time. There are no open sores, no preulcerative lesions, no rash or signs of infection present.  Painful dystrophic nail hallux left and fifth toes bilateral.  Vascular: Dorsalis Pedis artery and Posterior Tibial artery pedal pulses are 2/4 bilateral with immedate capillary fill time. Pedal hair growth present. No varicosities and no lower extremity edema present bilateral.   Neruologic: Grossly intact via light touch bilateral. Vibratory intact via tuning fork bilateral. Protective threshold with Semmes Wienstein monofilament intact to all  pedal sites bilateral. Patellar and Achilles deep tendon reflexes 2+ bilateral. No Babinski or clonus noted bilateral.   Musculoskeletal: No gross boney pedal deformities bilateral. No pain, crepitus, or limitation noted with foot and ankle range of motion bilateral. Muscular strength 5/5 in all groups tested bilateral.  Gait: Unassisted, Nonantalgic.    Radiographs:  None taken  Assessment & Plan:   Assessment: Painful ingrown  toenails hallux left and fifth nails bilaterally.  Plan: Today we performed a total nail avulsions made with matrixectomy's hallux left and fifth toe left only.  She is provided with both oral and written home-going instruction for the care and soaking of the toes as well as a prescription for Cortisporin Otic to be applied twice daily after soaking.  I will follow-up with her in 2 weeks at which time if she is healing well we will go ahead and do the contralateral foot as well.     Marymargaret Kirker T. [place], [place]

## 2020-04-26 NOTE — Patient Instructions (Addendum)
Betadine Soak Instructions  Purchase an 8 oz. bottle of BETADINE solution (Povidone)  THE DAY AFTER THE PROCEDURE  Place 1 tablespoon of betadine solution or Epson salt in a quart of warm tap water.  Submerge your foot or feet with outer bandage intact for the initial soak; this will allow the bandage to become moist and wet for easy lift off.  Once you remove your bandage, continue to soak in the solution for 20 minutes.  This soak should be done twice a day.  Next, remove your foot or feet from solution, blot dry the affected area and cover.  You may use a band aid large enough to cover the area or use gauze and tape.  Apply other medications to the area as directed by the doctor such as cortisporin otic solution (ear drops) or neosporin.  IF YOUR SKIN BECOMES IRRITATED WHILE USING THESE INSTRUCTIONS, IT IS OKAY TO SWITCH TO EPSOM SALTS AND WATER OR WHITE VINEGAR AND WATER.   Long Term Care Instructions-Post Nail Surgery  You have had your ingrown toenail and root treated with a chemical.  This chemical causes a burn that will drain and ooze like a blister.  This can drain for 6-8 weeks or longer.  It is important to keep this area clean, covered, and follow the soaking instructions dispensed at the time of your surgery.  This area will eventually dry and form a scab.  Once the scab forms you no longer need to soak or apply a dressing.  If at any time you experience an increase in pain, redness, swelling, or drainage, you should contact the office as soon as possible.  

## 2020-05-10 ENCOUNTER — Ambulatory Visit: Payer: No Typology Code available for payment source | Admitting: Podiatry

## 2020-05-11 ENCOUNTER — Other Ambulatory Visit (HOSPITAL_COMMUNITY): Payer: Self-pay | Admitting: Internal Medicine

## 2020-05-11 ENCOUNTER — Other Ambulatory Visit: Payer: Self-pay | Admitting: Internal Medicine

## 2020-05-11 DIAGNOSIS — F5101 Primary insomnia: Secondary | ICD-10-CM

## 2020-05-11 MED FILL — ZOLPIDEM TART ER 12.5 MG TA: 12.5 | 30 days supply | Qty: 30 | Fill #0

## 2020-05-11 NOTE — Telephone Encounter (Signed)
Last filled 04/09/2020.... please advise  

## 2020-06-10 ENCOUNTER — Other Ambulatory Visit: Payer: Self-pay | Admitting: Internal Medicine

## 2020-06-10 DIAGNOSIS — F5101 Primary insomnia: Secondary | ICD-10-CM

## 2020-06-10 MED FILL — PREMPRO 0.3 MG-1.5 MG TAB: 0.3-1.5 | 84 days supply | Qty: 84 | Fill #0

## 2020-06-10 NOTE — Telephone Encounter (Signed)
Last filled 07/02/2019.... please advise  

## 2020-06-11 ENCOUNTER — Other Ambulatory Visit (HOSPITAL_COMMUNITY): Payer: Self-pay | Admitting: Internal Medicine

## 2020-06-11 MED FILL — ZOLPIDEM TART ER 12.5 MG TA: 12.5 | 30 days supply | Qty: 30 | Fill #0

## 2020-07-13 ENCOUNTER — Other Ambulatory Visit: Payer: Self-pay | Admitting: Internal Medicine

## 2020-07-13 ENCOUNTER — Other Ambulatory Visit: Payer: Self-pay | Admitting: Gastroenterology

## 2020-07-13 ENCOUNTER — Other Ambulatory Visit (HOSPITAL_COMMUNITY): Payer: Self-pay | Admitting: Gastroenterology

## 2020-07-13 DIAGNOSIS — F5101 Primary insomnia: Secondary | ICD-10-CM

## 2020-07-13 DIAGNOSIS — D509 Iron deficiency anemia, unspecified: Secondary | ICD-10-CM

## 2020-07-13 MED FILL — PANTOPRAZOLE SOD DR 40 MG T: 40 | 90 days supply | Qty: 180 | Fill #0

## 2020-07-14 ENCOUNTER — Other Ambulatory Visit (HOSPITAL_COMMUNITY): Payer: Self-pay | Admitting: Internal Medicine

## 2020-07-14 MED FILL — FAMOTIDINE 20 MG TABLET: 20 | 90 days supply | Qty: 90 | Fill #0

## 2020-07-14 MED FILL — ZOLPIDEM TART ER 12.5 MG TA: 12.5 | 30 days supply | Qty: 30 | Fill #0

## 2020-07-14 MED FILL — ESCITALOPRAM 20 MG TABLET: 20 | 90 days supply | Qty: 90 | Fill #0

## 2020-07-14 NOTE — Telephone Encounter (Signed)
Ambien last filled 06/11/2020...Marland Kitchen please advise

## 2020-07-21 IMAGING — US US ABDOMEN LIMITED
1 series · 14 of 25 positions shown · non-contrast
Comparison: None.

CLINICAL DATA: Right upper quadrant pain

EXAM:
ULTRASOUND ABDOMEN LIMITED RIGHT UPPER QUADRANT

[Series 1: us abdomen limited · 14 of 31 slices shown]
[im 1/31]
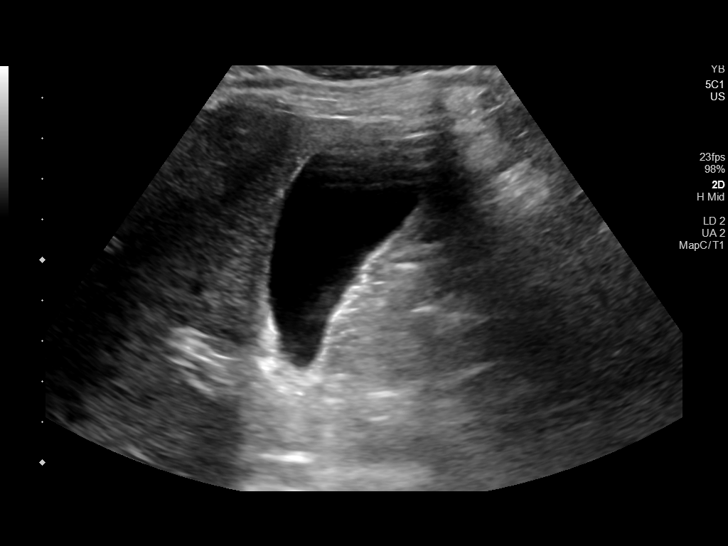
[im 3/31]
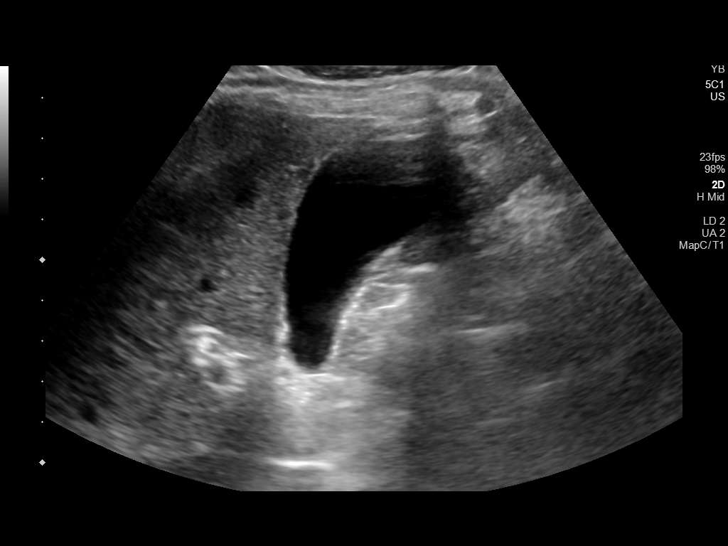
[im 6/31]
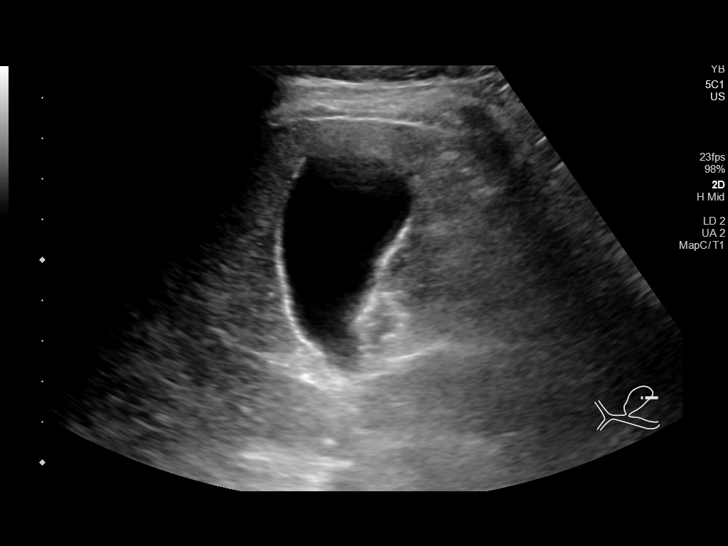
[im 8/31]
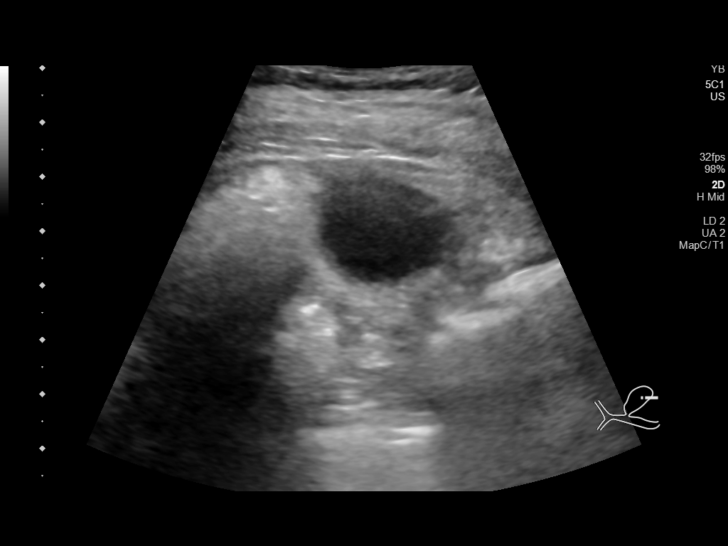
[im 11/31]
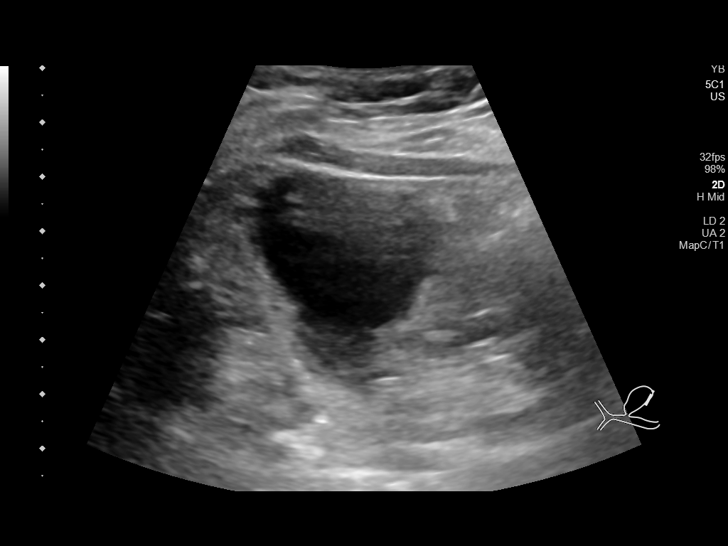
[im 12/31]
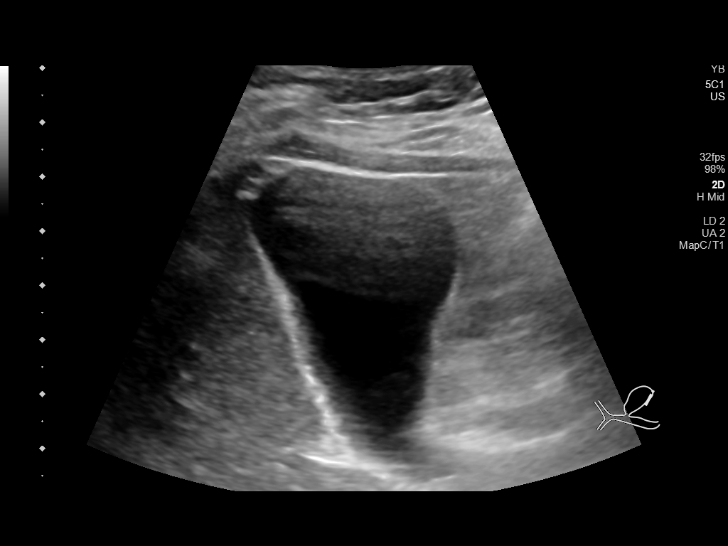
[im 14/31]
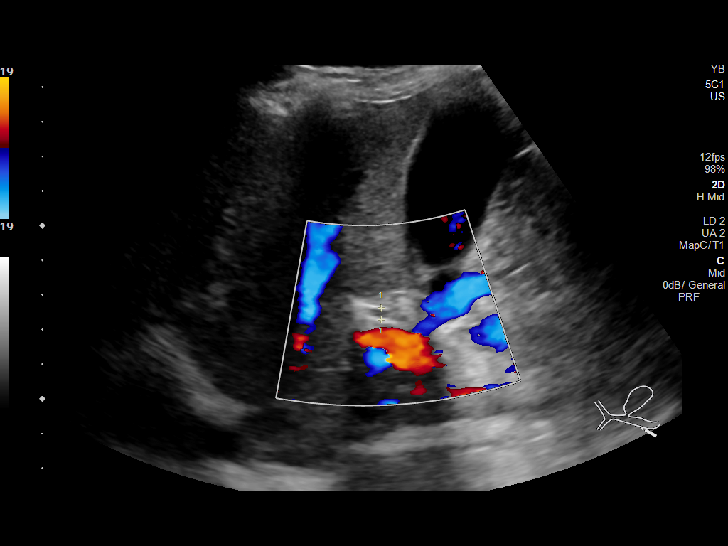
[im 17/31]
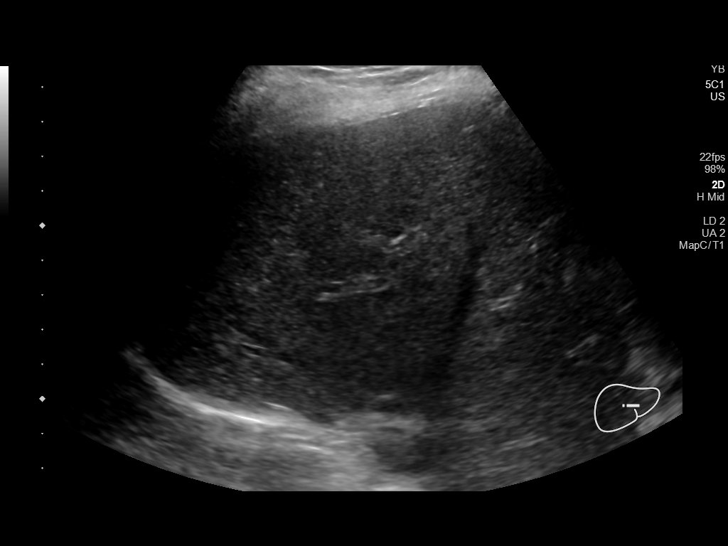
[im 19/31]
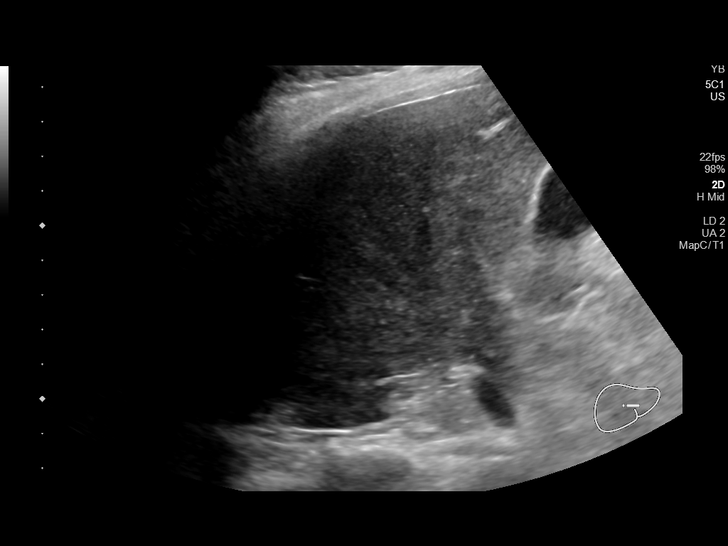
[im 21/31]
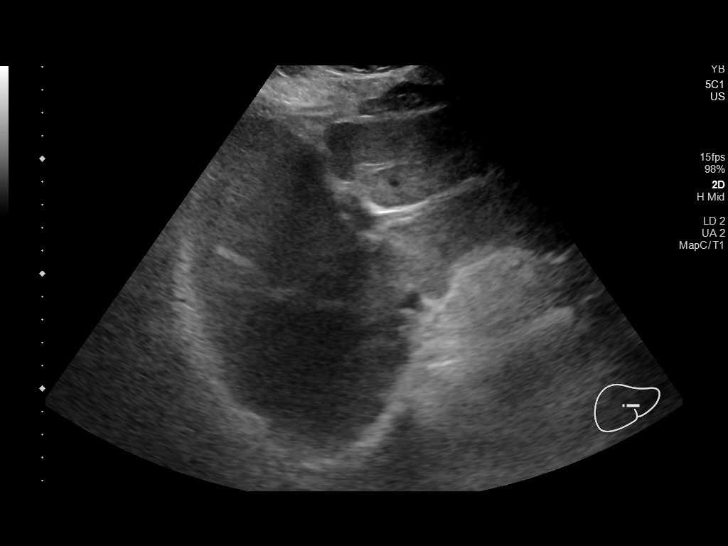
[im 23/31]
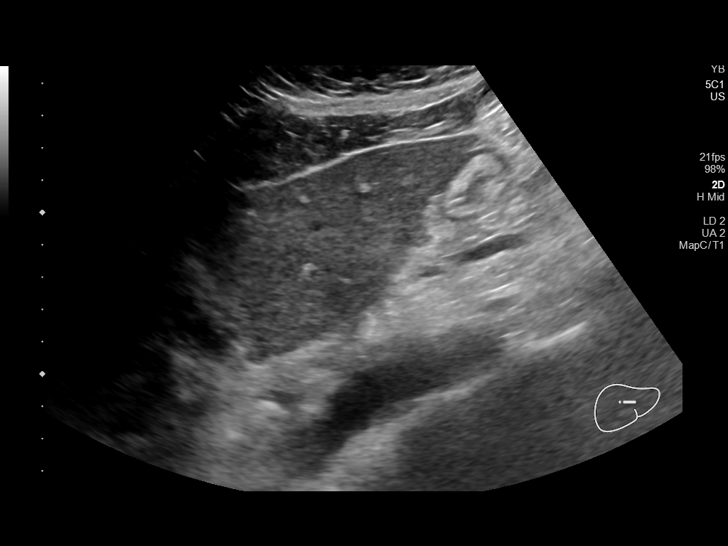
[im 26/31]
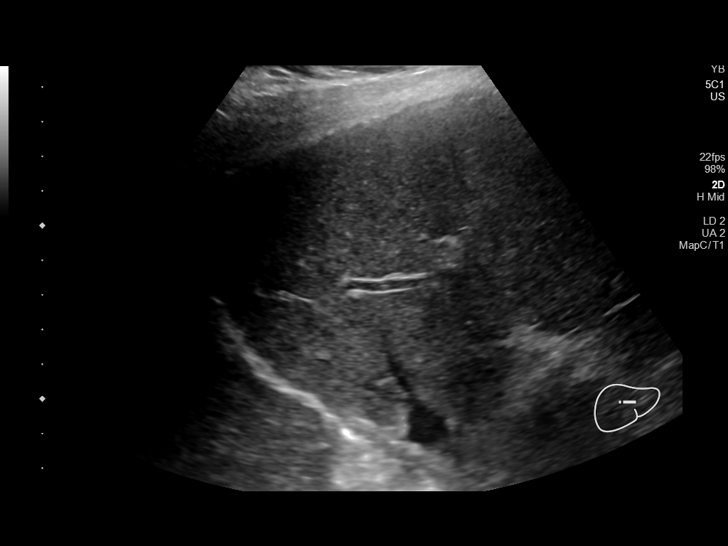
[im 28/31]
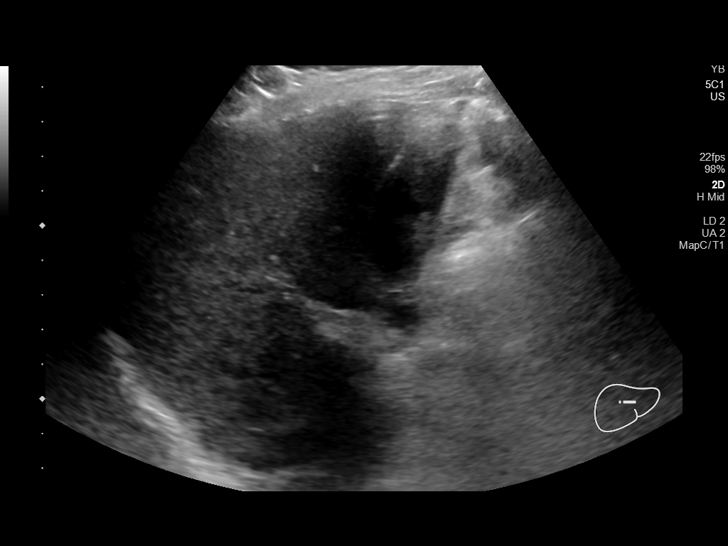
[im 31/31]
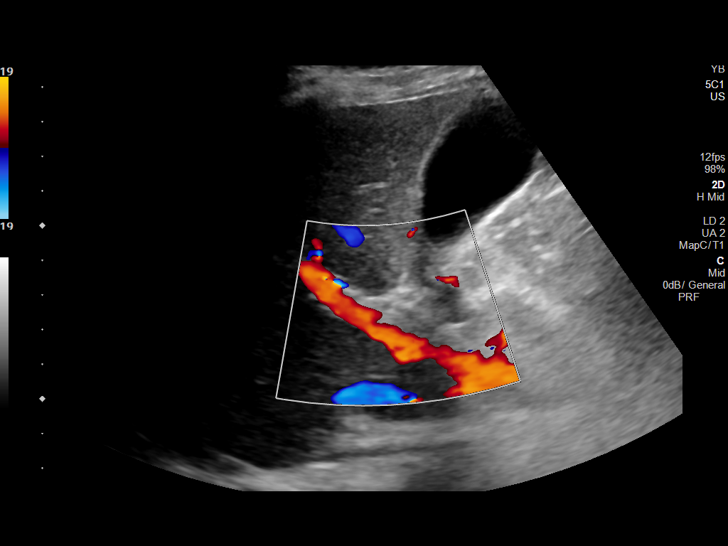

[14 of 25 positions shown; findings below may reference images not displayed]

FINDINGS: Gallbladder:

No gallstones or wall thickening visualized. No sonographic Murphy
sign noted by sonographer.

Common bile duct:

Diameter: 3 mm

Liver:

Slight increased hepatic echogenicity without focal hepatic
abnormality. Portal vein is patent on color Doppler imaging with
normal direction of blood flow towards the liver.

Other: None.
IMPRESSION: 1. Negative for gallstones or acute cholecystitis
2. Slight increased hepatic echogenicity as may be seen with
steatosis

## 2020-08-13 ENCOUNTER — Other Ambulatory Visit (HOSPITAL_COMMUNITY): Payer: Self-pay | Admitting: Internal Medicine

## 2020-08-13 ENCOUNTER — Other Ambulatory Visit: Payer: Self-pay | Admitting: Internal Medicine

## 2020-08-13 DIAGNOSIS — F5101 Primary insomnia: Secondary | ICD-10-CM

## 2020-08-13 MED FILL — ZOLPIDEM TART ER 12.5 MG TA: 12.5 | 30 days supply | Qty: 30 | Fill #0

## 2020-08-13 NOTE — Telephone Encounter (Signed)
Last filled 07/14/2020... please advise 

## 2020-08-17 ENCOUNTER — Ambulatory Visit: Payer: No Typology Code available for payment source | Admitting: Internal Medicine

## 2020-08-17 DIAGNOSIS — Z0289 Encounter for other administrative examinations: Secondary | ICD-10-CM

## 2020-08-17 NOTE — Progress Notes (Deleted)
Subjective:    Patient ID: Crystal Morris, female    DOB: [birth year], 55 y.o.   MRN: [id number]  HPI    Review of Systems  Past Medical History:  Diagnosis Date  . History of chicken pox   . Medical history non-contributory   . Wears glasses     Current Outpatient Medications  Medication Sig Dispense Refill  . escitalopram (LEXAPRO) 20 MG tablet TAKE 1 TABLET (20 MG TOTAL) BY MOUTH DAILY. MUST SCHEDULE PHYSICAL EXAM 90 tablet 0  . famotidine (PEPCID) 20 MG tablet TAKE 1 TABLET (20 MG TOTAL) BY MOUTH NIGHTLY AS DIRECTED 90 tablet 0  . PREMPRO 0.3-1.5 MG tablet TAKE 1 TABLET BY MOUTH DAILY. 84 tablet 0  . aspirin-acetaminophen-caffeine (EXCEDRIN MIGRAINE) 250-250-65 MG tablet Take by mouth every 6 (six) hours as needed for headache.    . diphenhydrAMINE (BENADRYL) 25 mg capsule Take 25 mg by mouth at bedtime as needed for itching, allergies or sleep.    . fluticasone (FLONASE) 50 MCG/ACT nasal spray Place 2 sprays into both nostrils daily. 16 g 6  . Lidocaine HCl 3 % GEL Apply topically.    . NEOMYCIN-POLYMYXIN-HYDROCORTISONE (CORTISPORIN) 1 % SOLN OTIC solution Apply 1-2 drops to toe BID after soaking 10 mL 1  . pantoprazole (PROTONIX) 40 MG tablet TAKE 1 TABLET (40 MG TOTAL) BY MOUTH 2 (TWO) TIMES DAILY. 180 tablet 12  . Polyethyl Glycol-Propyl Glycol (SYSTANE OP) Place 1 drop into both eyes daily.     . polyethylene glycol powder (GLYCOLAX/MIRALAX) 17 GM/SCOOP powder Take by mouth.    . zolpidem (AMBIEN CR) 12.5 MG CR tablet TAKE 1 TABLET BY MOUTH EVERY EVENING AT BEDTIME (NEEDS TO SCHEDULE PHYSICAL) 30 tablet 0   No current facility-administered medications for this visit.    Allergies  Allergen Reactions  . Oxycodone Itching    Patient said that she doesn't have problems with it 05/13/2019    Family History  Problem Relation Age of Onset  . Colon cancer Neg Hx   . Esophageal cancer Neg Hx   . Rectal cancer Neg Hx   . Stomach cancer Neg Hx     Social History    Socioeconomic History  . Marital status: Married    Spouse name: Not on file  . Number of children: Not on file  . Years of education: Not on file  . Highest education level: Not on file  Occupational History  . Not on file  Tobacco Use  . Smoking status: Never Smoker  . Smokeless tobacco: Never Used  Vaping Use  . Vaping Use: Never used  Substance and Sexual Activity  . Alcohol use: No  . Drug use: No  . Sexual activity: Not on file  Other Topics Concern  . Not on file  Social History Narrative  . Not on file   Social Determinants of Health   Financial Resource Strain: Not on file  Food Insecurity: Not on file  Transportation Needs: Not on file  Physical Activity: Not on file  Stress: Not on file  Social Connections: Not on file  Intimate Partner Violence: Not on file     Constitutional: Denies fever, malaise, fatigue, headache or abrupt weight changes.  HEENT: Denies eye pain, eye redness, ear pain, ringing in the ears, wax buildup, runny nose, nasal congestion, bloody nose, or sore throat. Respiratory: Denies difficulty breathing, shortness of breath, cough or sputum production.   Cardiovascular: Denies chest pain, chest tightness, palpitations or swelling in the hands or  feet.  Gastrointestinal: Denies abdominal pain, bloating, constipation, diarrhea or blood in the stool.  GU: Denies urgency, frequency, pain with urination, burning sensation, blood in urine, odor or discharge. Musculoskeletal: Denies decrease in range of motion, difficulty with gait, muscle pain or joint pain and swelling.  Skin: Denies redness, rashes, lesions or ulcercations.  Neurological: Denies dizziness, difficulty with memory, difficulty with speech or problems with balance and coordination.  Psych: Denies anxiety, depression, SI/HI.  No other specific complaints in a complete review of systems (except as listed in HPI above).     Objective:   Physical Exam  There were no vitals  taken for this visit. Wt Readings from Last 3 Encounters:  04/20/20 136 lb (61.7 kg)  06/26/19 157 lb 9.6 oz (71.5 kg)  05/22/19 153 lb (69.4 kg)    General: Appears their stated age, well developed, well nourished in NAD. Skin: Warm, dry and intact. No rashes, lesions or ulcerations noted. HEENT: Head: normal shape and size; Eyes: sclera white, no icterus, conjunctiva pink, PERRLA and EOMs intact; Ears: Tm's gray and intact, normal light reflex; Nose: mucosa pink and moist, septum midline; Throat/Mouth: Teeth present, mucosa pink and moist, no exudate, lesions or ulcerations noted.  Neck:  Neck supple, trachea midline. No masses, lumps or thyromegaly present.  Cardiovascular: Normal rate and rhythm. S1,S2 noted.  No murmur, rubs or gallops noted. No JVD or BLE edema. No carotid bruits noted. Pulmonary/Chest: Normal effort and positive vesicular breath sounds. No respiratory distress. No wheezes, rales or ronchi noted.  Abdomen: Soft and nontender. Normal bowel sounds. No distention or masses noted. Liver, spleen and kidneys non palpable. Musculoskeletal: Normal range of motion. No signs of joint swelling. No difficulty with gait.  Neurological: Alert and oriented. Cranial nerves II-XII grossly intact. Coordination normal.  Psychiatric: Mood and affect normal. Behavior is normal. Judgment and thought content normal.   EKG:  BMET    Component Value Date/Time   NA 137 05/22/2019 1433   NA 138 03/23/2015 0808   K 3.9 05/22/2019 1433   K 4.9 03/23/2015 0808   CL 100 05/22/2019 1433   CO2 30 05/22/2019 1433   CO2 25 03/23/2015 0808   GLUCOSE 90 05/22/2019 1433   GLUCOSE 86 03/23/2015 0808   BUN 16 05/22/2019 1433   BUN 19.1 03/23/2015 0808   CREATININE 1.00 05/22/2019 1433   CREATININE 1.0 03/23/2015 0808   CALCIUM 9.4 05/22/2019 1433   CALCIUM 8.7 03/23/2015 0808   GFRNONAA 51 (L) 03/28/2019 1826   GFRAA 59 (L) 03/28/2019 1826    Lipid Panel     Component Value Date/Time    CHOL 174 05/22/2019 1433   TRIG 70.0 05/22/2019 1433   HDL 54.30 05/22/2019 1433   CHOLHDL 3 05/22/2019 1433   VLDL 14.0 05/22/2019 1433   LDLCALC 106 (H) 05/22/2019 1433    CBC    Component Value Date/Time   WBC 5.0 06/26/2019 0836   WBC 6.3 05/22/2019 1433   RBC 4.51 06/26/2019 0836   HGB 11.5 (L) 06/26/2019 0836   HGB 11.6 03/23/2015 0808   HCT 36.6 06/26/2019 0836   HCT 37.4 03/23/2015 0808   PLT 274 06/26/2019 0836   PLT 302 03/23/2015 0808   MCV 81.2 06/26/2019 0836   MCV 86.2 03/23/2015 0808   MCH 25.5 (L) 06/26/2019 0836   MCHC 31.4 06/26/2019 0836   RDW 14.0 06/26/2019 0836   RDW 13.0 03/23/2015 0808   LYMPHSABS 2.0 06/26/2019 0836   LYMPHSABS 1.5 03/23/2015  0347   MONOABS 0.5 06/26/2019 0836   MONOABS 0.3 03/23/2015 0808   EOSABS 0.1 06/26/2019 0836   EOSABS 0.0 03/23/2015 0808   BASOSABS 0.0 06/26/2019 0836   BASOSABS 0.0 03/23/2015 0808    Hgb A1C No results found for: HGBA1C          Assessment & Plan:    Nicki Reaper, NP This visit occurred during the SARS-CoV-2 public health emergency.  Safety protocols were in place, including screening questions prior to the visit, additional usage of staff PPE, and extensive cleaning of exam room while observing appropriate contact time as indicated for disinfecting solutions.

## 2020-08-25 ENCOUNTER — Other Ambulatory Visit (HOSPITAL_BASED_OUTPATIENT_CLINIC_OR_DEPARTMENT_OTHER): Payer: Self-pay

## 2020-09-20 ENCOUNTER — Other Ambulatory Visit (HOSPITAL_COMMUNITY): Payer: Self-pay

## 2020-09-20 ENCOUNTER — Other Ambulatory Visit: Payer: Self-pay | Admitting: Internal Medicine

## 2020-09-24 ENCOUNTER — Other Ambulatory Visit (HOSPITAL_COMMUNITY): Payer: Self-pay

## 2020-09-30 ENCOUNTER — Other Ambulatory Visit (HOSPITAL_COMMUNITY): Payer: Self-pay

## 2020-09-30 ENCOUNTER — Other Ambulatory Visit: Payer: Self-pay | Admitting: Internal Medicine

## 2020-10-01 ENCOUNTER — Other Ambulatory Visit (HOSPITAL_COMMUNITY): Payer: Self-pay

## 2020-10-01 MED ORDER — ZOLPIDEM TARTRATE ER 12.5 MG PO TBCR
EXTENDED_RELEASE_TABLET | ORAL | 0 refills | Status: DC
  Filled 2020-10-01: qty 30, 30d supply, fill #0

## 2020-10-01 NOTE — Telephone Encounter (Signed)
  LAST APPOINTMENT DATE: 09/20/2020   NEXT APPOINTMENT DATE:@5 /20/2022  MEDICATION: Remus Loffler  PHARMACY: [facility]  Let patient know to contact pharmacy at the end of the day to make sure medication is ready.  Please notify patient to allow 48-72 hours to process  Encourage patient to contact the pharmacy for refills or they can request refills through [facility]  CLINICAL FILLS OUT ALL BELOW:   LAST REFILL:  QTY:  REFILL DATE:    OTHER COMMENTS:    Okay for refill?  Please advise

## 2020-10-04 ENCOUNTER — Other Ambulatory Visit (HOSPITAL_COMMUNITY): Payer: Self-pay

## 2020-10-07 ENCOUNTER — Other Ambulatory Visit (HOSPITAL_COMMUNITY): Payer: Self-pay

## 2020-10-22 ENCOUNTER — Ambulatory Visit (INDEPENDENT_AMBULATORY_CARE_PROVIDER_SITE_OTHER): Payer: No Typology Code available for payment source | Admitting: Family Medicine

## 2020-10-22 ENCOUNTER — Other Ambulatory Visit (HOSPITAL_COMMUNITY): Payer: Self-pay

## 2020-10-22 ENCOUNTER — Other Ambulatory Visit: Payer: Self-pay

## 2020-10-22 ENCOUNTER — Encounter: Payer: Self-pay | Admitting: Family Medicine

## 2020-10-22 VITALS — BP 136/76 | HR 92 | Temp 97.0°F | Ht 61.5 in | Wt 144.2 lb

## 2020-10-22 DIAGNOSIS — K219 Gastro-esophageal reflux disease without esophagitis: Secondary | ICD-10-CM | POA: Diagnosis not present

## 2020-10-22 DIAGNOSIS — D508 Other iron deficiency anemias: Secondary | ICD-10-CM | POA: Diagnosis not present

## 2020-10-22 DIAGNOSIS — F5101 Primary insomnia: Secondary | ICD-10-CM

## 2020-10-22 DIAGNOSIS — F39 Unspecified mood [affective] disorder: Secondary | ICD-10-CM

## 2020-10-22 DIAGNOSIS — G43909 Migraine, unspecified, not intractable, without status migrainosus: Secondary | ICD-10-CM

## 2020-10-22 DIAGNOSIS — R32 Unspecified urinary incontinence: Secondary | ICD-10-CM | POA: Insufficient documentation

## 2020-10-22 DIAGNOSIS — N3941 Urge incontinence: Secondary | ICD-10-CM

## 2020-10-22 DIAGNOSIS — E559 Vitamin D deficiency, unspecified: Secondary | ICD-10-CM | POA: Diagnosis not present

## 2020-10-22 DIAGNOSIS — Z0000 Encounter for general adult medical examination without abnormal findings: Secondary | ICD-10-CM | POA: Diagnosis not present

## 2020-10-22 DIAGNOSIS — N951 Menopausal and female climacteric states: Secondary | ICD-10-CM

## 2020-10-22 MED ORDER — FAMOTIDINE 20 MG PO TABS
ORAL_TABLET | ORAL | 3 refills | Status: DC
  Filled 2020-10-22: qty 90, 90d supply, fill #0
  Filled 2021-02-09: qty 90, 90d supply, fill #1
  Filled 2021-05-06: qty 90, 90d supply, fill #2
  Filled 2021-07-21: qty 90, 90d supply, fill #3

## 2020-10-22 MED ORDER — PANTOPRAZOLE SODIUM 40 MG PO TBEC
DELAYED_RELEASE_TABLET | Freq: Two times a day (BID) | ORAL | 3 refills | Status: DC
  Filled 2020-10-22: qty 180, 90d supply, fill #0
  Filled 2021-02-09: qty 180, 90d supply, fill #1
  Filled 2021-05-06: qty 180, 90d supply, fill #2
  Filled 2021-07-21: qty 180, 90d supply, fill #3

## 2020-10-22 MED ORDER — PREMPRO 0.3-1.5 MG PO TABS
1.0000 | ORAL_TABLET | Freq: Every day | ORAL | 3 refills | Status: DC
  Filled 2020-10-22: qty 84, 84d supply, fill #0
  Filled 2021-02-09: qty 84, 84d supply, fill #1
  Filled 2021-05-06: qty 84, 84d supply, fill #2
  Filled 2021-07-21: qty 84, 84d supply, fill #3

## 2020-10-22 MED ORDER — ESCITALOPRAM OXALATE 20 MG PO TABS
ORAL_TABLET | ORAL | 3 refills | Status: DC
  Filled 2020-10-22: qty 90, 90d supply, fill #0
  Filled 2021-02-09: qty 90, 90d supply, fill #1
  Filled 2021-05-06: qty 90, 90d supply, fill #2
  Filled 2021-07-21: qty 90, 90d supply, fill #3

## 2020-10-22 NOTE — Progress Notes (Signed)
Subjective:    Patient ID: Crystal Morris, female    DOB: 02-19-1966, 55 y.o.   MRN: [id number]  This visit occurred during the SARS-CoV-2 public health emergency.  Safety protocols were in place, including screening questions prior to the visit, additional usage of staff PPE, and extensive cleaning of exam room while observing appropriate contact time as indicated for disinfecting solutions.    HPI 55 yo [name] presents for annual exam   Wt Readings from Last 3 Encounters:  10/22/20 144 lb 3 oz (65.4 kg)  04/20/20 136 lb (61.7 kg)  06/26/19 157 lb 9.6 oz (71.5 kg)   26.80 kg/m  Takes good care of yourself  Eats fairly healthy  Very physical job - in hospital    Flu shot 11/21 Tetanus shot 2016 covid immunized   Mammogram 11/20  Self breast exam -no lumps   Pap 2/18 (she thought she had one 2020)  Thought it was here    Colonoscopy was 11/20 - told she did not need another one for ? 2515 y   Thinks she had the shingrix vaccine    H/o iron def anemia  Has seen hematology and had iron infusions (as needed)   GERD with hernia and ulcers, has seen GI  Per pt -not supposed to go back  Doing well  Taking ppi and H2 blocker She was supposed to get EGD before covid - now feeling better so does not want to do it    Screening for HIV or Hep C  H/o vit D def  Takes lexapro for help with migraines Also helps with mood   Takes prempro She understands risks of breast cancer and blood clots She still wants to take it  Severe hot flashes she cannot work with   Has some urinary incontinence  Has to wear pad   No vaginal bleeding   Patient Active Problem List   Diagnosis Date Noted  . Routine general medical examination at a health care facility 10/22/2020  . Urinary incontinence 10/22/2020  . GERD (gastroesophageal reflux disease) 05/22/2019  . Mood disorder (HCC) 09/09/2018  . Primary insomnia 10/18/2017  . Frequent headaches 10/18/2017  . Symptoms,  such as flushing, sleeplessness, headache, lack of concentration, associated with the menopause 10/18/2017  . Iron deficiency anemia 09/09/2014  . Constipation 12/13/2009  . Vitamin D deficiency 12/13/2009  . Anemia, unspecified 12/03/2009  . Allergic rhinitis 12/01/2009  . Attention or concentration deficit 12/01/2009  . Migraine headache 12/01/2009   Past Medical History:  Diagnosis Date  . History of chicken pox   . Medical history non-contributory   . Wears glasses    Past Surgical History:  Procedure Laterality Date  . CESAREAN SECTION    . CLOSED REDUCTION METACARPAL WITH PERCUTANEOUS PINNING Left 07/28/2014   Procedure: CLOSED REDUCTION METACARPAL WITH PERCUTANEOUS PINNING PROXIMAL PHALANX FRACTURES LEFT MIDDLE RING AND SMALL FINGERS;  Surgeon: Betha LoaKevin Kuzma, MD;  Location: [facility];  Service: Orthopedics;  Laterality: Left;  . COLONOSCOPY     2016  . HEMORROIDECTOMY    . INTRAUTERINE DEVICE INSERTION     Social History   Tobacco Use  . Smoking status: Never Smoker  . Smokeless tobacco: Never Used  Vaping Use  . Vaping Use: Never used  Substance Use Topics  . Alcohol use: No  . Drug use: No   Family History  Problem Relation Age of Onset  . Colon cancer Neg Hx   . Esophageal cancer Neg  Hx   . Rectal cancer Neg Hx   . Stomach cancer Neg Hx    Allergies  Allergen Reactions  . Oxycodone Itching    Patient said that she doesn't have problems with it 05/13/2019   Current Outpatient Medications on File Prior to Visit  Medication Sig Dispense Refill  . aspirin-acetaminophen-caffeine (EXCEDRIN MIGRAINE) 250-250-65 MG tablet Take by mouth every 6 (six) hours as needed for headache.    . diphenhydrAMINE (BENADRYL) 25 mg capsule Take 25 mg by mouth at bedtime as needed for itching, allergies or sleep.    . famotidine (PEPCID) 20 MG tablet TAKE 1 TABLET (20 MG TOTAL) BY MOUTH NIGHTLY AS DIRECTED 90 tablet 0  . fluticasone (FLONASE) 50 MCG/ACT nasal spray  Place 2 sprays into both nostrils daily. 16 g 6  . pantoprazole (PROTONIX) 40 MG tablet TAKE 1 TABLET (40 MG TOTAL) BY MOUTH 2 (TWO) TIMES DAILY. 180 tablet 12  . Polyethyl Glycol-Propyl Glycol (SYSTANE OP) Place 1 drop into both eyes daily.     . polyethylene glycol powder (GLYCOLAX/MIRALAX) 17 GM/SCOOP powder Take by mouth.    . zolpidem (AMBIEN CR) 12.5 MG CR tablet TAKE 1 TABLET BY MOUTH EVERY EVENING AT BEDTIME (NEEDS TO SCHEDULE PHYSICAL) 30 tablet 0  . zolpidem (AMBIEN CR) 12.5 MG CR tablet TAKE 1 TABLET BY MOUTH EVERY EVENING AT BEDTIME (NEEDS TO SCHEDULE PHYSICAL) 30 tablet 0   No current facility-administered medications on file prior to visit.    Review of Systems  Constitutional: Positive for fatigue. Negative for activity change, appetite change, fever and unexpected weight change.  HENT: Negative for congestion, ear pain, rhinorrhea, sinus pressure and sore throat.   Eyes: Negative for pain, redness and visual disturbance.  Respiratory: Negative for cough, shortness of breath and wheezing.   Cardiovascular: Negative for chest pain and palpitations.  Gastrointestinal: Negative for abdominal pain, blood in stool, constipation and diarrhea.  Endocrine: Negative for polydipsia and polyuria.  Genitourinary: Positive for urgency. Negative for dysuria and frequency.       Urinary incontinence  Severe menopausal symptoms if off hrt  Musculoskeletal: Negative for arthralgias, back pain and myalgias.  Skin: Negative for pallor and rash.  Allergic/Immunologic: Negative for environmental allergies.  Neurological: Negative for dizziness, syncope and headaches.  Hematological: Negative for adenopathy. Does not bruise/bleed easily.  Psychiatric/Behavioral: Negative for decreased concentration and dysphoric mood. The patient is not nervous/anxious.        Objective:   Physical Exam Constitutional:      General: She is not in acute distress.    Appearance: Normal appearance. She is  well-developed and normal weight. She is not ill-appearing or diaphoretic.     Comments: Strong accent  Extra time spent to understand   HENT:     Head: Normocephalic and atraumatic.     Right Ear: Tympanic membrane, ear canal and external ear normal.     Left Ear: Tympanic membrane, ear canal and external ear normal.     Nose: Nose normal. No congestion.     Mouth/Throat:     Mouth: Mucous membranes are moist.     Pharynx: Oropharynx is clear. No posterior oropharyngeal erythema.  Eyes:     General: No scleral icterus.    Extraocular Movements: Extraocular movements intact.     Conjunctiva/sclera: Conjunctivae normal.     Pupils: Pupils are equal, round, and reactive to light.  Neck:     Thyroid: No thyromegaly.     Vascular: No carotid bruit or  JVD.  Cardiovascular:     Rate and Rhythm: Normal rate and regular rhythm.     Pulses: Normal pulses.     Heart sounds: Normal heart sounds. No gallop.   Pulmonary:     Effort: Pulmonary effort is normal. No respiratory distress.     Breath sounds: Normal breath sounds. No wheezing.     Comments: Good air exch Chest:     Chest wall: No tenderness.  Abdominal:     General: Bowel sounds are normal. There is no distension or abdominal bruit.     Palpations: Abdomen is soft. There is no mass.     Tenderness: There is no abdominal tenderness.     Hernia: No hernia is present.  Genitourinary:    Comments: Declines breast exam Musculoskeletal:        General: No tenderness. Normal range of motion.     Cervical back: Normal range of motion and neck supple. No rigidity. No muscular tenderness.     Right lower leg: No edema.     Left lower leg: No edema.  Lymphadenopathy:     Cervical: No cervical adenopathy.  Skin:    General: Skin is warm and dry.     Coloration: Skin is not pale.     Findings: No erythema or rash.  Neurological:     Mental Status: She is alert. Mental status is at baseline.     Cranial Nerves: No cranial nerve  deficit.     Motor: No abnormal muscle tone.     Coordination: Coordination normal.     Gait: Gait normal.     Deep Tendon Reflexes: Reflexes are normal and symmetric.  Psychiatric:        Mood and Affect: Mood normal.     Comments: Pleasant             Assessment & Plan:   Problem List Items Addressed This Visit      Cardiovascular and Mediastinum   Migraine headache    lexapro helps this significantly  Will continue this at 20 mg daily       Relevant Medications   escitalopram (LEXAPRO) 20 MG tablet     Digestive   GERD (gastroesophageal reflux disease)    Per pt -EGD was planned with GI but she declined due to feeling better on ppi and H2 Plan to continue protonix 40 mg bid pepcid 20 mg daily  Better diet  She may not return to GI , unsure      Relevant Medications   famotidine (PEPCID) 20 MG tablet   pantoprazole (PROTONIX) 40 MG tablet     Other   Iron deficiency anemia    Cbc today No menses  Had colonoscopy 2020      Relevant Orders   CBC with Differential/Platelet (Completed)   Iron (Completed)   Primary insomnia    Has been taking ambien chronically along with prempro Aware of risks        Symptoms, such as flushing, sleeplessness, headache, lack of concentration, associated with the menopause    Pt is new to me and taking prempro from prev PCP She voided understanding of inc risk of breast cancer and blood clots with this  She chooses to continue Strongly enc to schedule her mammogram She declines breast exam today       Mood disorder (HCC)    Taking prempro 0.3-1.5 mg daily  Also lexapro 20 mg daily  Stable currently  Rev records from NP [facility]  Vitamin D deficiency   Relevant Orders   VITAMIN D 25 Hydroxy (Vit-D Deficiency, Fractures) (Completed)   Routine general medical examination at a health care facility - Primary    Reviewed health habits including diet and exercise and skin cancer prevention Reviewed appropriate  screening tests for age  Also reviewed health mt list, fam hx and immunization status , as well as social and family history   Pt new to me today and relevant records reviewed  Labs drawn  utd imms  Thinks she had shingrix vaccine but no record of this  Taking vit D utd colonoscopy 2020 Pap 2018 -on prempro (disc risks) Mammogram due-given information for her to schedule Mentions urinary incontinence and would like urology ref later         Relevant Orders   CBC with Differential/Platelet (Completed)   Lipid panel (Completed)   TSH (Completed)   Comprehensive metabolic panel (Completed)   Urinary incontinence

## 2020-10-22 NOTE — Patient Instructions (Addendum)
You are due for a yearly mammogram  If you want that, please schedule at the breast center   Your Remus Loffler is not due for a refill for another week so I cannot send a refill today  When it is due I can change the refill to 90 days   The prempro may increase your breast cancer risk and blood clot risk so be aware of that   I will place a referral to urology for urine incontinence next week so you will get a call

## 2020-10-23 ENCOUNTER — Other Ambulatory Visit (HOSPITAL_COMMUNITY): Payer: Self-pay

## 2020-10-23 LAB — CBC WITH DIFFERENTIAL/PLATELET
Absolute Monocytes: 538 cells/uL (ref 200–950)
Basophils Absolute: 21 cells/uL (ref 0–200)
Basophils Relative: 0.5 %
Eosinophils Absolute: 38 cells/uL (ref 15–500)
Eosinophils Relative: 0.9 %
HCT: 36.8 % (ref 35.0–45.0)
Hemoglobin: 11.2 g/dL — ABNORMAL LOW (ref 11.7–15.5)
Lymphs Abs: 2180 cells/uL (ref 850–3900)
MCH: 24.3 pg — ABNORMAL LOW (ref 27.0–33.0)
MCHC: 30.4 g/dL — ABNORMAL LOW (ref 32.0–36.0)
MCV: 79.8 fL — ABNORMAL LOW (ref 80.0–100.0)
MPV: 9.9 fL (ref 7.5–12.5)
Monocytes Relative: 12.8 %
Neutro Abs: 1424 cells/uL — ABNORMAL LOW (ref 1500–7800)
Neutrophils Relative %: 33.9 %
Platelets: 290 10*3/uL (ref 140–400)
RBC: 4.61 10*6/uL (ref 3.80–5.10)
RDW: 12.9 % (ref 11.0–15.0)
Total Lymphocyte: 51.9 %
WBC: 4.2 10*3/uL (ref 3.8–10.8)

## 2020-10-23 LAB — COMPREHENSIVE METABOLIC PANEL
AG Ratio: 1.4 (calc) (ref 1.0–2.5)
ALT: 13 U/L (ref 6–29)
AST: 18 U/L (ref 10–35)
Albumin: 3.8 g/dL (ref 3.6–5.1)
Alkaline phosphatase (APISO): 44 U/L (ref 37–153)
BUN: 15 mg/dL (ref 7–25)
CO2: 27 mmol/L (ref 20–32)
Calcium: 8.8 mg/dL (ref 8.6–10.4)
Chloride: 102 mmol/L (ref 98–110)
Creat: 0.91 mg/dL (ref 0.50–1.05)
Globulin: 2.8 g/dL (calc) (ref 1.9–3.7)
Glucose, Bld: 84 mg/dL (ref 65–99)
Potassium: 4.3 mmol/L (ref 3.5–5.3)
Sodium: 138 mmol/L (ref 135–146)
Total Bilirubin: 0.3 mg/dL (ref 0.2–1.2)
Total Protein: 6.6 g/dL (ref 6.1–8.1)

## 2020-10-23 LAB — TSH: TSH: 1.07 mIU/L

## 2020-10-23 LAB — VITAMIN D 25 HYDROXY (VIT D DEFICIENCY, FRACTURES): Vit D, 25-Hydroxy: 20 ng/mL — ABNORMAL LOW (ref 30–100)

## 2020-10-23 LAB — LIPID PANEL
Cholesterol: 147 mg/dL (ref <200)
HDL: 59 mg/dL (ref >=50)
LDL Cholesterol (Calc): 73 mg/dL (calc)
Non-HDL Cholesterol (Calc): 88 mg/dL (calc) (ref <130)
Total CHOL/HDL Ratio: 2.5 (calc) (ref <5.0)
Triglycerides: 66 mg/dL (ref <150)

## 2020-10-23 LAB — IRON: Iron: 65 ug/dL (ref 45–160)

## 2020-10-24 NOTE — Assessment & Plan Note (Signed)
Taking prempro 0.3-1.5 mg daily  Also lexapro 20 mg daily  Stable currently  Rev records from NP [facility]

## 2020-10-24 NOTE — Assessment & Plan Note (Signed)
Per pt -EGD was planned with GI but she declined due to feeling better on ppi and H2 Plan to continue protonix 40 mg bid pepcid 20 mg daily  Better diet  She may not return to GI , unsure

## 2020-10-24 NOTE — Assessment & Plan Note (Signed)
Has been taking ambien chronically along with prempro Aware of risks

## 2020-10-24 NOTE — Assessment & Plan Note (Signed)
Reviewed health habits including diet and exercise and skin cancer prevention Reviewed appropriate screening tests for age  Also reviewed health mt list, fam hx and immunization status , as well as social and family history   Pt new to me today and relevant records reviewed  Labs drawn  utd imms  Thinks she had shingrix vaccine but no record of this  Taking vit D utd colonoscopy 2020 Pap 2018 -on prempro (disc risks) Mammogram due-given information for her to schedule Mentions urinary incontinence and would like urology ref later

## 2020-10-24 NOTE — Assessment & Plan Note (Signed)
lexapro helps this significantly  Will continue this at 20 mg daily

## 2020-10-24 NOTE — Assessment & Plan Note (Signed)
Pt is new to me and taking prempro from prev PCP She voided understanding of inc risk of breast cancer and blood clots with this  She chooses to continue Strongly enc to schedule her mammogram She declines breast exam today

## 2020-10-24 NOTE — Assessment & Plan Note (Signed)
Cbc today No menses  Had colonoscopy 2020

## 2020-10-25 ENCOUNTER — Telehealth: Payer: Self-pay | Admitting: Family Medicine

## 2020-10-25 ENCOUNTER — Encounter: Payer: Self-pay | Admitting: Family Medicine

## 2020-10-25 ENCOUNTER — Other Ambulatory Visit (HOSPITAL_COMMUNITY): Payer: Self-pay

## 2020-10-25 ENCOUNTER — Telehealth: Payer: Self-pay | Admitting: *Deleted

## 2020-10-25 DIAGNOSIS — N3941 Urge incontinence: Secondary | ICD-10-CM

## 2020-10-25 NOTE — Telephone Encounter (Signed)
Left VM requesting pt to call the office back 

## 2020-10-25 NOTE — Telephone Encounter (Signed)
Urology referral for incontinence

## 2020-10-25 NOTE — Telephone Encounter (Signed)
-----   Message from Judy Pimple, MD sent at 10/24/2020  8:53 AM EDT ----- Vit D is low  How much is she taking currently? Stable anemia -will cc to hematology

## 2020-10-26 ENCOUNTER — Telehealth: Payer: Self-pay | Admitting: Family Medicine

## 2020-10-26 ENCOUNTER — Other Ambulatory Visit (HOSPITAL_COMMUNITY): Payer: Self-pay

## 2020-10-26 MED ORDER — ERGOCALCIFEROL 1.25 MG (50000 UT) PO CAPS
50000.0000 [IU] | ORAL_CAPSULE | ORAL | 0 refills | Status: DC
  Filled 2020-10-26: qty 12, 84d supply, fill #0

## 2020-10-26 NOTE — Telephone Encounter (Signed)
I sent it  Take it for 12 weeks then stop  She needs to continue vit D beyond that -so inst to take 2000 iu daily indefinitely to keep level nl

## 2020-10-26 NOTE — Telephone Encounter (Signed)
Patient was called, she did not answer LVM for her to call back and get her results.

## 2020-10-26 NOTE — Telephone Encounter (Signed)
Pt also sent this info via mychart so mychart message sent to pt letting pt know Rx was sent and advise pt of Dr. Royden Purl comments

## 2020-10-26 NOTE — Telephone Encounter (Signed)
-----   Message from Shon Millet, [place] sent at 10/26/2020  9:19 AM EDT ----- Please see pt's mychart regarding OTC vitamin D. Pt wants Rx strength

## 2020-11-04 ENCOUNTER — Other Ambulatory Visit (HOSPITAL_COMMUNITY): Payer: Self-pay

## 2020-11-04 ENCOUNTER — Other Ambulatory Visit: Payer: Self-pay | Admitting: Family

## 2020-11-08 ENCOUNTER — Other Ambulatory Visit (HOSPITAL_COMMUNITY): Payer: Self-pay

## 2020-11-08 MED ORDER — ZOLPIDEM TARTRATE ER 12.5 MG PO TBCR
12.5000 mg | EXTENDED_RELEASE_TABLET | Freq: Every day | ORAL | 5 refills | Status: DC
  Filled 2020-11-08: qty 30, 30d supply, fill #0
  Filled 2020-12-07: qty 30, 30d supply, fill #1
  Filled 2021-01-10: qty 30, 30d supply, fill #2
  Filled 2021-02-09: qty 30, 30d supply, fill #3
  Filled 2021-03-08: qty 30, 30d supply, fill #4
  Filled 2021-04-07: qty 30, 30d supply, fill #5

## 2020-11-08 NOTE — Telephone Encounter (Signed)
Patient called to check on this. Patient was seen by Dr Milinda Antis on 10/22/20 for CPE and this refill request was sent to [facility] last week but has not been refilled still. Patient is out of the medication x 10 days.  Last refilled by [facility] on 10/01/20 # 30 with 0 refill and the note at that time said needs CPE which patient had now.   Please review.

## 2020-11-18 ENCOUNTER — Encounter: Payer: Self-pay | Admitting: Oncology

## 2020-11-18 ENCOUNTER — Other Ambulatory Visit: Payer: Self-pay | Admitting: Internal Medicine

## 2020-11-18 DIAGNOSIS — Z1231 Encounter for screening mammogram for malignant neoplasm of breast: Secondary | ICD-10-CM

## 2020-12-07 ENCOUNTER — Other Ambulatory Visit (HOSPITAL_COMMUNITY): Payer: Self-pay

## 2020-12-20 ENCOUNTER — Ambulatory Visit: Payer: Self-pay | Admitting: Urology

## 2021-01-10 ENCOUNTER — Other Ambulatory Visit (HOSPITAL_COMMUNITY): Payer: Self-pay

## 2021-01-12 ENCOUNTER — Other Ambulatory Visit: Payer: Self-pay

## 2021-01-12 ENCOUNTER — Other Ambulatory Visit: Payer: Self-pay | Admitting: Family Medicine

## 2021-01-12 ENCOUNTER — Ambulatory Visit
Admission: RE | Admit: 2021-01-12 | Discharge: 2021-01-12 | Disposition: A | Discharge status: Home / Self Care | Payer: No Typology Code available for payment source | Source: Ambulatory Visit

## 2021-01-12 DIAGNOSIS — Z1231 Encounter for screening mammogram for malignant neoplasm of breast: Secondary | ICD-10-CM

## 2021-02-09 ENCOUNTER — Other Ambulatory Visit (HOSPITAL_COMMUNITY): Payer: Self-pay

## 2021-02-09 ENCOUNTER — Other Ambulatory Visit: Payer: Self-pay | Admitting: Family Medicine

## 2021-02-10 ENCOUNTER — Other Ambulatory Visit (HOSPITAL_COMMUNITY): Payer: Self-pay

## 2021-03-08 ENCOUNTER — Other Ambulatory Visit (HOSPITAL_COMMUNITY): Payer: Self-pay

## 2021-04-07 ENCOUNTER — Other Ambulatory Visit (HOSPITAL_COMMUNITY): Payer: Self-pay

## 2021-05-06 ENCOUNTER — Other Ambulatory Visit (HOSPITAL_COMMUNITY): Payer: Self-pay

## 2021-05-06 ENCOUNTER — Other Ambulatory Visit: Payer: Self-pay | Admitting: Family Medicine

## 2021-05-09 NOTE — Telephone Encounter (Signed)
CPE was with Dr. Milinda Antis on 10/22/20 but no TOC appt as been scheduled with our new NPs, last filled on 11/08/20 #30 tabs with 5 refills

## 2021-05-10 ENCOUNTER — Other Ambulatory Visit (HOSPITAL_COMMUNITY): Payer: Self-pay

## 2021-05-10 MED ORDER — ZOLPIDEM TARTRATE ER 12.5 MG PO TBCR
12.5000 mg | EXTENDED_RELEASE_TABLET | Freq: Every day | ORAL | 5 refills | Status: DC
  Filled 2021-05-10: qty 30, 30d supply, fill #0
  Filled 2021-06-20: qty 30, 30d supply, fill #1
  Filled 2021-07-21: qty 30, 30d supply, fill #2
  Filled 2021-08-16 – 2021-08-17 (×2): qty 30, 30d supply, fill #3
  Filled 2021-09-16: qty 30, 30d supply, fill #4
  Filled 2021-10-18: qty 30, 30d supply, fill #5

## 2021-05-10 NOTE — Telephone Encounter (Signed)
Since pt has TOC appt scheduled now will route to [facility] pool fore review

## 2021-05-10 NOTE — Addendum Note (Signed)
Addended by: Shon Millet on: 05/10/2021 10:26 AM   Modules accepted: Orders

## 2021-05-10 NOTE — Telephone Encounter (Signed)
Worthy Rancher, NP declined med saying pt needs a TOC appt with one of the new NP, please schedule appt

## 2021-05-10 NOTE — Telephone Encounter (Signed)
Called mrs. Rose and got her schedule with tabitha on 12/13 @240 

## 2021-05-12 ENCOUNTER — Other Ambulatory Visit (HOSPITAL_COMMUNITY): Payer: Self-pay

## 2021-05-16 ENCOUNTER — Telehealth: Payer: Self-pay | Admitting: Internal Medicine

## 2021-05-16 ENCOUNTER — Telehealth: Payer: Self-pay | Admitting: Family

## 2021-05-17 ENCOUNTER — Encounter: Payer: No Typology Code available for payment source | Admitting: Family

## 2021-06-20 ENCOUNTER — Other Ambulatory Visit (HOSPITAL_COMMUNITY): Payer: Self-pay

## 2021-07-21 ENCOUNTER — Other Ambulatory Visit (HOSPITAL_COMMUNITY): Payer: Self-pay

## 2021-08-15 ENCOUNTER — Other Ambulatory Visit (HOSPITAL_COMMUNITY): Payer: Self-pay

## 2021-08-16 ENCOUNTER — Other Ambulatory Visit (HOSPITAL_COMMUNITY): Payer: Self-pay

## 2021-08-17 ENCOUNTER — Other Ambulatory Visit (HOSPITAL_COMMUNITY): Payer: Self-pay

## 2021-08-21 ENCOUNTER — Emergency Department (HOSPITAL_COMMUNITY)
Admission: EM | Admit: 2021-08-21 | Discharge: 2021-08-21 | Disposition: A | Discharge status: Home / Self Care | Payer: No Typology Code available for payment source | Attending: Emergency Medicine | Admitting: Emergency Medicine

## 2021-08-21 ENCOUNTER — Encounter (HOSPITAL_COMMUNITY): Payer: Self-pay | Admitting: Emergency Medicine

## 2021-08-21 DIAGNOSIS — K529 Noninfective gastroenteritis and colitis, unspecified: Secondary | ICD-10-CM | POA: Insufficient documentation

## 2021-08-21 DIAGNOSIS — E860 Dehydration: Secondary | ICD-10-CM | POA: Diagnosis not present

## 2021-08-21 DIAGNOSIS — R197 Diarrhea, unspecified: Secondary | ICD-10-CM | POA: Diagnosis present

## 2021-08-21 LAB — COMPREHENSIVE METABOLIC PANEL
ALT: 23 U/L (ref 0–44)
AST: 26 U/L (ref 15–41)
Albumin: 3.9 g/dL (ref 3.5–5.0)
Alkaline Phosphatase: 37 U/L — ABNORMAL LOW (ref 38–126)
Anion gap: 9 (ref 5–15)
BUN: 24 mg/dL — ABNORMAL HIGH (ref 6–20)
CO2: 22 mmol/L (ref 22–32)
Calcium: 8.4 mg/dL — ABNORMAL LOW (ref 8.9–10.3)
Chloride: 102 mmol/L (ref 98–111)
Creatinine, Ser: 0.96 mg/dL (ref 0.44–1.00)
GFR, Estimated: >60 mL/min (ref >60)
Glucose, Bld: 123 mg/dL — ABNORMAL HIGH (ref 70–99)
Potassium: 3.8 mmol/L (ref 3.5–5.1)
Sodium: 133 mmol/L — ABNORMAL LOW (ref 135–145)
Total Bilirubin: 0.4 mg/dL (ref 0.3–1.2)
Total Protein: 7.7 g/dL (ref 6.5–8.1)

## 2021-08-21 LAB — CBC
HCT: 38.9 % (ref 36.0–46.0)
Hemoglobin: 12.5 g/dL (ref 12.0–15.0)
MCH: 26 pg (ref 26.0–34.0)
MCHC: 32.1 g/dL (ref 30.0–36.0)
MCV: 80.9 fL (ref 80.0–100.0)
Platelets: 299 10*3/uL (ref 150–400)
RBC: 4.81 MIL/uL (ref 3.87–5.11)
RDW: 13.8 % (ref 11.5–15.5)
WBC: 3.5 10*3/uL — ABNORMAL LOW (ref 4.0–10.5)
nRBC: 0 % (ref 0.0–0.2)

## 2021-08-21 LAB — URINALYSIS, ROUTINE W REFLEX MICROSCOPIC
Bilirubin Urine: NEGATIVE
Glucose, UA: NEGATIVE mg/dL
Hgb urine dipstick: NEGATIVE
Ketones, ur: NEGATIVE mg/dL
Leukocytes,Ua: NEGATIVE
Nitrite: NEGATIVE
Protein, ur: NEGATIVE mg/dL
Specific Gravity, Urine: >1.03 — ABNORMAL HIGH (ref 1.005–1.030)
pH: 5.5 (ref 5.0–8.0)

## 2021-08-21 LAB — I-STAT BETA HCG BLOOD, ED (MC, WL, AP ONLY): I-stat hCG, quantitative: <5 m[IU]/mL (ref <5)

## 2021-08-21 LAB — LIPASE, BLOOD: Lipase: 32 U/L (ref 11–51)

## 2021-08-21 MED ORDER — ONDANSETRON 4 MG PO TBDP: ORAL_TABLET | ORAL | 0 refills | Status: DC

## 2021-08-21 MED ORDER — KETOROLAC TROMETHAMINE 30 MG/ML IJ SOLN
30.0000 mg | Freq: Once | INTRAMUSCULAR | Status: AC
  Administered 2021-08-21: 30 mg via INTRAVENOUS
  Filled 2021-08-21: qty 1

## 2021-08-21 MED ORDER — SODIUM CHLORIDE 0.9 % IV BOLUS
1000.0000 mL | Freq: Once | INTRAVENOUS | Status: AC
  Administered 2021-08-21: 1000 mL via INTRAVENOUS

## 2021-08-21 MED ORDER — ONDANSETRON HCL 4 MG/2ML IJ SOLN
4.0000 mg | Freq: Once | INTRAMUSCULAR | Status: AC
  Administered 2021-08-21: 4 mg via INTRAVENOUS
  Filled 2021-08-21: qty 2

## 2021-08-21 MED ORDER — ONDANSETRON 4 MG PO TBDP
4.0000 mg | ORAL_TABLET | Freq: Once | ORAL | Status: AC | PRN
Start: 2021-08-21 — End: 2021-08-21
  Administered 2021-08-21: 4 mg via ORAL
  Filled 2021-08-21: qty 1

## 2021-08-21 MED ORDER — DICYCLOMINE HCL 20 MG PO TABS: ORAL_TABLET | ORAL | 0 refills | Status: DC

## 2021-08-21 NOTE — ED Triage Notes (Addendum)
Patient c/o N/V/D, body aches, fever, and chills since this morning. States other family members are sick as well. Patient state she believes it is food poisoning. ?

## 2021-08-21 NOTE — Discharge Instructions (Signed)
Drink plenty of fluids.  Follow-up in a couple days if not improving. ?

## 2021-08-21 NOTE — ED Provider Notes (Signed)
?[facility]-EMERGENCY DEPT ?Provider Note ? ? ?CSN: [id number] ?Arrival date & time: 08/21/21  1045 ? ?  ? ?History ? ?Chief Complaint  ?Patient presents with  ? Emesis  ? Diarrhea  ? ? ?Crystal Morris is a 56 y.o. female. ? ?Patient states that she has been having diarrhea some nausea and vomiting abdominal cramping.  Patient has history of GERD ? ?The history is provided by the patient and medical records. No language interpreter was used.  ?Emesis ?Severity:  Moderate ?Timing:  Constant ?Quality:  Bilious material ?Able to tolerate:  Liquids ?Progression:  Unchanged ?Chronicity:  New ?Recent urination:  Normal ?Relieved by:  Nothing ?Worsened by:  Nothing ?Associated symptoms: diarrhea   ?Associated symptoms: no abdominal pain, no cough and no headaches   ?Diarrhea ?Associated symptoms: vomiting   ?Associated symptoms: no abdominal pain and no headaches   ? ?  ? ?Home Medications ? ?Allergies    ?Oxycodone   ? ?Review of Systems   ?Review of Systems  ?Constitutional:  Negative for appetite change and fatigue.  ?HENT:  Negative for congestion, ear discharge and sinus pressure.   ?Eyes:  Negative for discharge.  ?Respiratory:  Negative for cough.   ?Cardiovascular:  Negative for chest pain.  ?Gastrointestinal:  Positive for diarrhea and vomiting. Negative for abdominal pain.  ?Genitourinary:  Negative for frequency and hematuria.  ?Musculoskeletal:  Negative for back pain.  ?Skin:  Negative for rash.  ?Neurological:  Negative for seizures and headaches.  ?Psychiatric/Behavioral:  Negative for hallucinations.   ? ?Physical Exam ?Updated Vital Signs ?BP 129/78   Pulse (!) 106   Temp 98.8 ?F (37.1 ?C) (Oral)   Resp 15   SpO2 97%  ?Physical Exam ?Vitals and nursing note reviewed.  ?Constitutional:   ?   Appearance: She is well-developed.  ?HENT:  ?   Head: Normocephalic.  ?   Nose: Nose normal.  ?Eyes:  ?   General: No scleral icterus. ?   Conjunctiva/sclera: Conjunctivae normal.  ?Neck:  ?    Thyroid: No thyromegaly.  ?Cardiovascular:  ?   Rate and Rhythm: Normal rate and regular rhythm.  ?   Heart sounds: No murmur heard. ?  No friction rub. No gallop.  ?Pulmonary:  ?   Breath sounds: No stridor. No wheezing or rales.  ?Chest:  ?   Chest wall: No tenderness.  ?Abdominal:  ?   General: There is no distension.  ?   Tenderness: There is no abdominal tenderness. There is no rebound.  ?Musculoskeletal:     ?   General: Normal range of motion.  ?   Cervical back: Neck supple.  ?Lymphadenopathy:  ?   Cervical: No cervical adenopathy.  ?Skin: ?   Findings: No erythema or rash.  ?Neurological:  ?   Mental Status: She is alert and oriented to person, place, and time.  ?   Motor: No abnormal muscle tone.  ?   Coordination: Coordination normal.  ?Psychiatric:     ?   Behavior: Behavior normal.  ? ? ?ED Results / Procedures / Treatments   ?Labs ?(all labs ordered are listed, but only abnormal results are displayed) ?Labs Reviewed  ?COMPREHENSIVE METABOLIC PANEL - Abnormal; Notable for the following components:  ?    Result Value  ? Sodium 133 (*)   ? Glucose, Bld 123 (*)   ? BUN 24 (*)   ? Calcium 8.4 (*)   ? Alkaline Phosphatase 37 (*)   ? All other  components within normal limits  ?CBC - Abnormal; Notable for the following components:  ? WBC 3.5 (*)   ? All other components within normal limits  ?URINALYSIS, ROUTINE W REFLEX MICROSCOPIC - Abnormal; Notable for the following components:  ? Specific Gravity, Urine >1.030 (*)   ? All other components within normal limits  ?LIPASE, BLOOD  ?I-STAT BETA HCG BLOOD, ED ([facility], [facility], [facility] ONLY)  ? ? ?EKG ?None ? ?Radiology ?No results found. ? ?Procedures ? ? ?Medications Ordered in ED ?Medications  ?ondansetron (ZOFRAN-ODT) disintegrating tablet 4 mg (4 mg Oral Given 08/21/21 1109)  ?sodium chloride 0.9 % bolus 1,000 mL (1,000 mLs Intravenous New Bag/Given 08/21/21 1607)  ?ketorolac (TORADOL) 30 MG/ML injection 30 mg (30 mg Intravenous Given 08/21/21 1607)  ?ondansetron (ZOFRAN)  injection 4 mg (4 mg Intravenous Given 08/21/21 1607)  ? ? ?ED Course/ Medical Decision Making/ A&P ?  ?                        ?Medical Decision Making ?Amount and/or Complexity of Data Reviewed ?Labs: ordered. ? ?Risk ?Prescription drug management. ? ?This patient presents to the ED for concern of diarrhea, this involves an extensive number of treatment options, and is a complaint that carries with it a high risk of complications and morbidity.  The differential diagnosis includes gastroenteritis colon cancer ? ? ?Co morbidities that complicate the patient evaluation ? ?GERD ? ? ?Additional history obtained: ? ?Additional history obtained from patient ?External records from outside source obtained and reviewed including hospital records ? ? ?Lab Tests: ? ?I Ordered, and personally interpreted labs.  The pertinent results include: CBC and chemistries which shows mild dehydration and mild decreased white count ? ? ?Imaging Studies ordered: ? ?No x-rays ?Cardiac Monitoring: / EKG: ? ?The patient was maintained on a cardiac monitor.  I personally viewed and interpreted the cardiac monitored which showed an underlying rhythm of: Normal sinus rhythm ? ? ?Consultations Obtained: ?No consultant ?Problem List / ED Course / Critical interventions / Medication management ? ?GERD and gastroenteritis ?I ordered medication including normal saline for dehydration and Zofran for nausea ?Reevaluation of the patient after these medicines showed that the patient improved ?I have reviewed the patients home medicines and have made adjustments as needed ? ? ?Social Determinants of Health: ? ?None ? ? ?Test / Admission - Considered: ? ?CT abdomen ? ?Patient improved with nausea medicine and fluids and Toradol.  Patient with dehydration and gastroenteritis she will be discharged home with Zofran and Bentyl ? ?Final Clinical Impression(s) / ED Diagnoses ?Final diagnoses:  ?Dehydration  ?Gastroenteritis  ? ? ?Rx / DC Orders ?ED Discharge  Orders   ? ?      Ordered  ?  dicyclomine (BENTYL) 20 MG tablet       ? 08/21/21 1824  ?  ondansetron (ZOFRAN-ODT) 4 MG disintegrating tablet       ? 08/21/21 1824  ? ?  ?  ? ?  ? ? ?  ?Bethann Berkshire, MD ?08/22/21 1613 ? ?

## 2021-08-24 ENCOUNTER — Other Ambulatory Visit (HOSPITAL_COMMUNITY): Payer: Self-pay

## 2021-09-08 ENCOUNTER — Other Ambulatory Visit (HOSPITAL_COMMUNITY): Payer: Self-pay

## 2021-09-08 ENCOUNTER — Emergency Department (HOSPITAL_COMMUNITY)
Admission: EM | Admit: 2021-09-08 | Discharge: 2021-09-08 | Disposition: A | Discharge status: Home / Self Care | Payer: No Typology Code available for payment source | Attending: Emergency Medicine | Admitting: Emergency Medicine

## 2021-09-08 ENCOUNTER — Emergency Department (HOSPITAL_COMMUNITY): Payer: No Typology Code available for payment source

## 2021-09-08 ENCOUNTER — Other Ambulatory Visit: Payer: Self-pay

## 2021-09-08 ENCOUNTER — Encounter (HOSPITAL_COMMUNITY): Payer: Self-pay

## 2021-09-08 DIAGNOSIS — Z7982 Long term (current) use of aspirin: Secondary | ICD-10-CM | POA: Diagnosis not present

## 2021-09-08 DIAGNOSIS — R109 Unspecified abdominal pain: Secondary | ICD-10-CM | POA: Diagnosis present

## 2021-09-08 LAB — BASIC METABOLIC PANEL
Anion gap: 6 (ref 5–15)
BUN: 19 mg/dL (ref 6–20)
CO2: 28 mmol/L (ref 22–32)
Calcium: 8.9 mg/dL (ref 8.9–10.3)
Chloride: 105 mmol/L (ref 98–111)
Creatinine, Ser: 0.97 mg/dL (ref 0.44–1.00)
GFR, Estimated: >60 mL/min (ref >60)
Glucose, Bld: 95 mg/dL (ref 70–99)
Potassium: 3.7 mmol/L (ref 3.5–5.1)
Sodium: 139 mmol/L (ref 135–145)

## 2021-09-08 LAB — CBC WITH DIFFERENTIAL/PLATELET
Abs Immature Granulocytes: 0.01 10*3/uL (ref 0.00–0.07)
Basophils Absolute: 0 10*3/uL (ref 0.0–0.1)
Basophils Relative: 1 %
Eosinophils Absolute: 0.2 10*3/uL (ref 0.0–0.5)
Eosinophils Relative: 3 %
HCT: 33.2 % — ABNORMAL LOW (ref 36.0–46.0)
Hemoglobin: 10.4 g/dL — ABNORMAL LOW (ref 12.0–15.0)
Immature Granulocytes: 0 %
Lymphocytes Relative: 40 %
Lymphs Abs: 2 10*3/uL (ref 0.7–4.0)
MCH: 25.7 pg — ABNORMAL LOW (ref 26.0–34.0)
MCHC: 31.3 g/dL (ref 30.0–36.0)
MCV: 82 fL (ref 80.0–100.0)
Monocytes Absolute: 0.5 10*3/uL (ref 0.1–1.0)
Monocytes Relative: 11 %
Neutro Abs: 2.3 10*3/uL (ref 1.7–7.7)
Neutrophils Relative %: 45 %
Platelets: 303 10*3/uL (ref 150–400)
RBC: 4.05 MIL/uL (ref 3.87–5.11)
RDW: 13.8 % (ref 11.5–15.5)
WBC: 5.1 10*3/uL (ref 4.0–10.5)
nRBC: 0 % (ref 0.0–0.2)

## 2021-09-08 LAB — URINALYSIS, ROUTINE W REFLEX MICROSCOPIC
Bacteria, UA: NONE SEEN
Bilirubin Urine: NEGATIVE
Glucose, UA: NEGATIVE mg/dL
Hgb urine dipstick: NEGATIVE
Ketones, ur: NEGATIVE mg/dL
Nitrite: NEGATIVE
Protein, ur: NEGATIVE mg/dL
Specific Gravity, Urine: 1.027 (ref 1.005–1.030)
pH: 6 (ref 5.0–8.0)

## 2021-09-08 MED ORDER — SODIUM CHLORIDE 0.9 % IV BOLUS
1000.0000 mL | Freq: Once | INTRAVENOUS | Status: AC
  Administered 2021-09-08: 1000 mL via INTRAVENOUS

## 2021-09-08 MED ORDER — KETOROLAC TROMETHAMINE 15 MG/ML IJ SOLN
15.0000 mg | Freq: Once | INTRAMUSCULAR | Status: AC
  Administered 2021-09-08: 15 mg via INTRAVENOUS
  Filled 2021-09-08: qty 1

## 2021-09-08 MED ORDER — HYDROCODONE-ACETAMINOPHEN 5-325 MG PO TABS
2.0000 | ORAL_TABLET | ORAL | 0 refills | Status: DC | PRN
  Filled 2021-09-08: qty 10, 1d supply, fill #0

## 2021-09-08 NOTE — ED Provider Notes (Signed)
?[facility]-EMERGENCY DEPT ?Provider Note ? ? ?CSN: [id number] ?Arrival date & time: 09/08/21  1223 ? ?  ? ?History ? ?Chief Complaint  ?Patient presents with  ? Flank Pain  ? ? ?Crystal Morris is a 56 y.o. female who presents emergency department for right flank pain for 2 days.  Pain came on suddenly. States that she has never had pain like this before and it's made worse with any kind of movement.  She reports that her pain radiates into her right abdomen.  Denies any nausea, vomiting, diarrhea.  Denies any urinary symptoms.  Denies any fever. She has tried over the counter medications without relief.  ? ? ?Flank Pain ?Associated symptoms include abdominal pain. Pertinent negatives include no chest pain and no shortness of breath.  ? ?  ? ?Home Medications ?Prior to Admission medications   ?Medication Sig Start Date End Date Taking? Authorizing Provider  ?aspirin-acetaminophen-caffeine (EXCEDRIN MIGRAINE) 250-250-65 MG tablet Take by mouth every 6 (six) hours as needed for headache.   Yes [provider]  ?diphenhydrAMINE (BENADRYL) 25 mg capsule Take 25 mg by mouth daily as needed for itching, allergies or sleep.   Yes [provider]  ?escitalopram (LEXAPRO) 20 MG tablet TAKE 1 TABLET BY MOUTH ONCE A DAY MUST SCHEDULE PHYSICAL EXAM 10/22/20 11/02/21 Yes Tower, Audrie GallusMarne A, MD  ?famotidine (PEPCID) 20 MG tablet TAKE 1 TABLET BY MOUTH EVERY EVENING AS DIRECTED 10/22/20 11/02/21 Yes Tower, Audrie GallusMarne A, MD  ?HYDROcodone-acetaminophen (NORCO/VICODIN) 5-325 MG tablet Take 2 tablets by mouth every 4 (four) hours as needed. 09/08/21  Yes Gracen Ringwald T, PA-C  ?pantoprazole (PROTONIX) 40 MG tablet TAKE 1 TABLET BY MOUTH 2 TIMES DAILY. 10/22/20 11/02/21 Yes Tower, Audrie GallusMarne A, MD  ?Polyethyl Glycol-Propyl Glycol (SYSTANE OP) Place 1 drop into both eyes daily.    Yes [provider]  ?polyethylene glycol powder (GLYCOLAX/MIRALAX) 17 GM/SCOOP powder Take 0.5 Containers by mouth daily as needed  for moderate constipation or mild constipation.   Yes [provider]  ?PREMPRO 0.3-1.5 MG tablet TAKE 1 TABLET BY MOUTH DAILY. 10/22/20 10/22/21 Yes Tower, Audrie GallusMarne A, MD  ?zolpidem (AMBIEN CR) 12.5 MG CR tablet Take 1 tablet (12.5 mg total) by mouth at bedtime. 05/10/21 11/06/21 Yes Worthy RancherWebb, Padonda B, FNP  ?dicyclomine (BENTYL) 20 MG tablet Take 1 every 8 hours as needed for abdominal cramp ?Patient not taking: Reported on 09/08/2021 08/21/21   Bethann BerkshireZammit, Joseph, MD  ?ergocalciferol (VITAMIN D2) 1.25 MG (50000 UT) capsule Take 1 capsule (50,000 Units total) by mouth once a week. ?Patient not taking: Reported on 09/08/2021 10/26/20   Tower, Audrie GallusMarne A, MD  ?fluticasone (FLONASE) 50 MCG/ACT nasal spray Place 2 sprays into both nostrils daily. ?Patient not taking: Reported on 09/08/2021 05/22/19   Lorre MunroeBaity, Regina W, NP  ?ondansetron (ZOFRAN-ODT) 4 MG disintegrating tablet 4mg  ODT q4 hours prn nausea/vomit ?Patient not taking: Reported on 09/08/2021 08/21/21   Bethann BerkshireZammit, Joseph, MD  ?pantoprazole (PROTONIX) 40 MG tablet TAKE 1 TABLET (40 MG TOTAL) BY MOUTH 2 (TWO) TIMES DAILY. ?Patient not taking: Reported on 09/08/2021 07/13/20   Lynann BolognaGupta, Rajesh, MD  ?zolpidem (AMBIEN CR) 12.5 MG CR tablet TAKE 1 TABLET BY MOUTH EVERY EVENING AT BEDTIME (NEEDS TO SCHEDULE PHYSICAL) ?Patient not taking: Reported on 09/08/2021 08/13/20   Lorre MunroeBaity, Regina W, NP  ?   ? ?Allergies    ?Oxycodone   ? ?Review of Systems   ?Review of Systems  ?Constitutional:  Negative for chills and fever.  ?Respiratory:  Negative for shortness of breath.   ?Cardiovascular:  Negative for chest pain.  ?Gastrointestinal:  Positive for abdominal pain. Negative for blood in stool, constipation, diarrhea, nausea and vomiting.  ?Genitourinary:  Positive for flank pain. Negative for decreased urine volume, dysuria, frequency, hematuria and urgency.  ?Musculoskeletal:  Negative for back pain.  ?All other systems reviewed and are negative. ? ?Physical Exam ?Updated Vital Signs ?BP (!) 143/93   Pulse  80   Temp 98.5 ?F (36.9 ?C) (Oral)   Resp 17   Ht 5\' 2"  (1.575 m)   Wt 68 kg   SpO2 99%   BMI 27.44 kg/m?  ?Physical Exam ?Vitals and nursing note reviewed.  ?Constitutional:   ?   Appearance: Normal appearance.  ?HENT:  ?   Head: Normocephalic and atraumatic.  ?Eyes:  ?   Conjunctiva/sclera: Conjunctivae normal.  ?Cardiovascular:  ?   Rate and Rhythm: Normal rate and regular rhythm.  ?Pulmonary:  ?   Effort: Pulmonary effort is normal. No respiratory distress.  ?   Breath sounds: Normal breath sounds.  ?Abdominal:  ?   General: There is no distension.  ?   Palpations: Abdomen is soft.  ?   Tenderness: There is no abdominal tenderness. There is no right CVA tenderness or left CVA tenderness.  ?Skin: ?   General: Skin is warm and dry.  ?Neurological:  ?   General: No focal deficit present.  ?   Mental Status: She is alert.  ? ? ?ED Results / Procedures / Treatments   ?Labs ?(all labs ordered are listed, but only abnormal results are displayed) ?Labs Reviewed  ?URINALYSIS, ROUTINE W REFLEX MICROSCOPIC - Abnormal; Notable for the following components:  ?    Result Value  ? Leukocytes,Ua TRACE (*)   ? All other components within normal limits  ?CBC WITH DIFFERENTIAL/PLATELET - Abnormal; Notable for the following components:  ? Hemoglobin 10.4 (*)   ? HCT 33.2 (*)   ? MCH 25.7 (*)   ? All other components within normal limits  ?BASIC METABOLIC PANEL  ? ? ?EKG ?None ? ?Radiology ?CT Renal Stone Study ? ?Result Date: 09/08/2021 ?CLINICAL DATA:  Right flank pain for 2 days EXAM: CT ABDOMEN AND PELVIS WITHOUT CONTRAST TECHNIQUE: Multidetector CT imaging of the abdomen and pelvis was performed following the standard protocol without IV contrast. RADIATION DOSE REDUCTION: This exam was performed according to the departmental dose-optimization program which includes automated exposure control, adjustment of the mA and/or kV according to patient size and/or use of iterative reconstruction technique. COMPARISON:  None.  FINDINGS: Lower chest: No acute abnormality. Hepatobiliary: No focal liver abnormality is seen. No gallstones, gallbladder wall thickening, or biliary dilatation. Pancreas: Unremarkable. No pancreatic ductal dilatation or surrounding inflammatory changes. Spleen: Normal in size without focal abnormality. Adrenals/Urinary Tract: Bilateral adrenal glands are unremarkable. Kidneys normal in size bilaterally with no evidence of hydronephrosis or nephrolithiasis. Normal appearance of the bladder. Stomach/Bowel: Stomach is within normal limits. Appendix not visualized, although there are no secondary findings of acute appendicitis. No evidence of bowel wall thickening, distention, or inflammatory changes. Vascular/Lymphatic: No significant vascular findings are present. No enlarged abdominal or pelvic lymph nodes. Reproductive: Uterus and bilateral adnexa are unremarkable. Other: No abdominal wall hernia or abnormality. No abdominopelvic ascites. Musculoskeletal: No acute or significant osseous findings. IMPRESSION: 1. No acute findings in the abdomen or pelvis, including no evidence of obstructive uropathy. 2. No nephrolithiasis. Electronically Signed   By: 11/08/2021 M.D.   On: 09/08/2021  14:48   ? ?Procedures ?Procedures  ? ? ?Medications Ordered in ED ?Medications  ?ketorolac (TORADOL) 15 MG/ML injection 15 mg (15 mg Intravenous Given 09/08/21 1338)  ?sodium chloride 0.9 % bolus 1,000 mL (1,000 mLs Intravenous New Bag/Given 09/08/21 1337)  ? ? ?ED Course/ Medical Decision Making/ A&P ?  ?                        ?Medical Decision Making ?Amount and/or Complexity of Data Reviewed ?Labs: ordered. ? ?Risk ?Prescription drug management. ? ? ?This patient presents to the ED for concern of flank pain, this involves an extensive number of treatment options, and is a complaint that carries with it a high risk of complications and morbidity. The emergent differential diagnosis prior to evaluation includes, but is not limited  to,  Pelvic inflammatory disease, ectopic pregnancy, appendicitis, urinary calculi, primary dysmenorrhea, septic abortion, ruptured ovarian cyst or tumor, ovarian torsion, tubo-ovarian abscess, degeneration of fi

## 2021-09-08 NOTE — ED Triage Notes (Signed)
Patient c/o right flank pain x 2 days. Patient denies any N/v/d, dysuria or frequency. ?

## 2021-09-08 NOTE — ED Notes (Signed)
Pt states understanding of dc instructions, importance of follow up and prescription. Pt denies questions or concerns upon dc.  Pt ambulated out of ed with steady gait, w/out need for assistance, pt declined wheelchair assistance upon dc.  No belongings left in room upon dc.  ?

## 2021-09-08 NOTE — Discharge Instructions (Addendum)
You were seen in the emergency department for flank pain. ? ?Your lab work and imaging were all reassuring today.  I think that your symptoms could be related to muscular pain.  I'd like to you to continue the medications that you are taking at home, but we will add on a stronger pain medicine in the following days. ? ?Your hemoglobin was a little low, I'd like you to follow-up next week with your primary doctor to have your labs rechecked.  He can also talk to them about symptoms you have been having. ? ?Continue to monitor how you're doing and return to the ER for new or worsening symptoms.  ?

## 2021-09-12 ENCOUNTER — Other Ambulatory Visit (HOSPITAL_COMMUNITY): Payer: Self-pay

## 2021-09-12 ENCOUNTER — Encounter: Payer: Self-pay | Admitting: Family Medicine

## 2021-09-12 ENCOUNTER — Ambulatory Visit (INDEPENDENT_AMBULATORY_CARE_PROVIDER_SITE_OTHER): Payer: No Typology Code available for payment source | Admitting: Family Medicine

## 2021-09-12 VITALS — BP 128/64 | HR 106 | Temp 97.6°F | Ht 62.0 in | Wt 147.0 lb

## 2021-09-12 DIAGNOSIS — M791 Myalgia, unspecified site: Secondary | ICD-10-CM | POA: Diagnosis not present

## 2021-09-12 DIAGNOSIS — R1013 Epigastric pain: Secondary | ICD-10-CM | POA: Insufficient documentation

## 2021-09-12 DIAGNOSIS — D508 Other iron deficiency anemias: Secondary | ICD-10-CM

## 2021-09-12 DIAGNOSIS — K219 Gastro-esophageal reflux disease without esophagitis: Secondary | ICD-10-CM

## 2021-09-12 DIAGNOSIS — Z7189 Other specified counseling: Secondary | ICD-10-CM

## 2021-09-12 MED ORDER — TRAMADOL HCL 50 MG PO TABS
50.0000 mg | ORAL_TABLET | Freq: Three times a day (TID) | ORAL | 0 refills | Status: AC | PRN
  Filled 2021-09-12: qty 15, 5d supply, fill #0

## 2021-09-12 MED ORDER — PANTOPRAZOLE SODIUM 40 MG PO TBEC
DELAYED_RELEASE_TABLET | Freq: Two times a day (BID) | ORAL | 3 refills | Status: DC
  Filled 2021-09-12: qty 180, fill #0

## 2021-09-12 NOTE — Assessment & Plan Note (Signed)
Pt wishes to be DNR despite age and health status  ?She understands this , implications discussed  ? ?Form (orange)  signed with pt and her spouse  ?

## 2021-09-12 NOTE — Patient Instructions (Addendum)
Check label of your excedrin -make sure there is no aspirin in it  ? ?I'm going to place a referral to hematology for the anemia and GI for the abdominal pain  ? ?You will get a call to set these up  ? ?Continue the pepcid daily  ?Increase your protonix to twice daily  ?Tylenol for pain  ? ?Tramadol only when absolutely needed  ?Caution of sedation and habit and constipation  ? ? ? ? ? ?

## 2021-09-12 NOTE — Assessment & Plan Note (Signed)
Pt has h/o UGI ulcer dz in the past (peptic and duodenal)   In setting of decreased Hb recently  ?Reviewed EGD from 2020 today and last GI note  ?Worse abdominal symptoms  ?Had n/v/d earlier in the month (that is better but still has pain) ? ?Was taking ppi qd inst of bid  ?Refilled protonix to 40 mg bid  ?Also pepcid daily  ?Ref made to GI for further eval  ?ER precautions reviewed  ? ?

## 2021-09-12 NOTE — Assessment & Plan Note (Signed)
Worse recently (in setting of abd pain and h/o PUD in the past) before she est with me (chart reviewed)  ? ?Cannot tolerate oral iron  ?Had iron infusion in 2020  ?Pt has symptoms of aching all over (this was also the case back then and improved with resolution of iron deficiency)  ?Urgent referral done to hematology  ?

## 2021-09-12 NOTE — Assessment & Plan Note (Signed)
Pt aches all over , particularly worse in R flank  ?Per pt -this was caused by iron def in the past and she is again low on Hb  ?Was in ER recently, CT done and nl ?Reviewed hospital records, lab results and studies in detail  ? ?Urgent referral done to hematology for anemia  ?Given px for tramadol to use for severe pain prn after rev data base  ?ER precautions reviewed  ?

## 2021-09-12 NOTE — Progress Notes (Signed)
? ?Subjective:  ? ? Patient ID: Crystal Morris, female    DOB: Feb 08, 1966, 57 y.o.   MRN: [id number] ? ?HPI ?Pt presents for f/u of ER visit on 4/6 ? ?Wt Readings from Last 3 Encounters:  ?09/12/21 147 lb (66.7 kg)  ?09/08/21 150 lb (68 kg)  ?10/22/20 144 lb 3 oz (65.4 kg)  ? ?26.89 kg/m? ? ?She presented to ER on 4/6 with 2 days of flank pain worsened with movement  ? ?W/u with lab and CT  ?Dx with MSK pain  ?Adv to try otc meds and muscle relaxer  ?Given small # of norco  ? ?All of her bones ache  ?Had a fever on 3/16  ? ?Never been diagnosed with sickle cell or thalassemia  ? ?Weak and tired  ?Achey all over in addition to the side  ? ?H/o ulcer she thinks  ?Takes protonix daily (supposed to be bid)  ?Pepcid once daily  ? ?No black stool  ?No hematemesis  ?No asa or ibuprofen or aleve  ? ?Takes excedrin am/pm  ? ? ? ?Lab Results  ?Component Value Date  ? CREATININE 0.97 09/08/2021  ? BUN 19 09/08/2021  ? NA 139 09/08/2021  ? K 3.7 09/08/2021  ? CL 105 09/08/2021  ? CO2 28 09/08/2021  ? ?Lab Results  ?Component Value Date  ? WBC 5.1 09/08/2021  ? HGB 10.4 (L) 09/08/2021  ? HCT 33.2 (L) 09/08/2021  ? MCV 82.0 09/08/2021  ? PLT 303 09/08/2021  ? ?Lab Results  ?Component Value Date  ? IRON 65 10/22/2020  ? TIBC 389 06/26/2019  ? FERRITIN 142 06/26/2019  ? ? ?UA: trace of leukocytes/otherwise clear ? ?CT Renal Stone Study ? ?Result Date: 09/08/2021 ?CLINICAL DATA:  Right flank pain for 2 days EXAM: CT ABDOMEN AND PELVIS WITHOUT CONTRAST TECHNIQUE: Multidetector CT imaging of the abdomen and pelvis was performed following the standard protocol without IV contrast. RADIATION DOSE REDUCTION: This exam was performed according to the departmental dose-optimization program which includes automated exposure control, adjustment of the mA and/or kV according to patient size and/or use of iterative reconstruction technique. COMPARISON:  None. FINDINGS: Lower chest: No acute abnormality. Hepatobiliary: No focal liver abnormality  is seen. No gallstones, gallbladder wall thickening, or biliary dilatation. Pancreas: Unremarkable. No pancreatic ductal dilatation or surrounding inflammatory changes. Spleen: Normal in size without focal abnormality. Adrenals/Urinary Tract: Bilateral adrenal glands are unremarkable. Kidneys normal in size bilaterally with no evidence of hydronephrosis or nephrolithiasis. Normal appearance of the bladder. Stomach/Bowel: Stomach is within normal limits. Appendix not visualized, although there are no secondary findings of acute appendicitis. No evidence of bowel wall thickening, distention, or inflammatory changes. Vascular/Lymphatic: No significant vascular findings are present. No enlarged abdominal or pelvic lymph nodes. Reproductive: Uterus and bilateral adnexa are unremarkable. Other: No abdominal wall hernia or abnormality. No abdominopelvic ascites. Musculoskeletal: No acute or significant osseous findings. IMPRESSION: 1. No acute findings in the abdomen or pelvis, including no evidence of obstructive uropathy. 2. No nephrolithiasis. Electronically Signed   By: Allegra Lai M.D.   On: 09/08/2021 14:48   ? ?Patient Active Problem List  ? Diagnosis Date Noted  ? Myalgia 09/12/2021  ? Dyspepsia 09/12/2021  ? DNR (do not resuscitate) discussion 09/12/2021  ? Routine general medical examination at a health care facility 10/22/2020  ? Urinary incontinence 10/22/2020  ? GERD (gastroesophageal reflux disease) 05/22/2019  ? Mood disorder (HCC) 09/09/2018  ? 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 Frequent headaches 10/18/2017  ?  Symptoms, such as flushing, sleeplessness, headache, lack of concentration, associated with the menopause 10/18/2017  ? Iron deficiency anemia 09/09/2014  ? Constipation 12/13/2009  ? Vitamin D deficiency 12/13/2009  ? Anemia, unspecified 12/03/2009  ? Allergic rhinitis 12/01/2009  ? Attention or concentration deficit 12/01/2009  ? Migraine headache 12/01/2009  ? ?Past Medical History:  ?Diagnosis  Date  ? History of chicken pox   ? Medical history non-contributory   ? Wears glasses   ? ?Past Surgical History:  ?Procedure Laterality Date  ? CESAREAN SECTION    ? CLOSED REDUCTION METACARPAL WITH PERCUTANEOUS PINNING Left 07/28/2014  ? Procedure: CLOSED REDUCTION METACARPAL WITH PERCUTANEOUS PINNING PROXIMAL PHALANX FRACTURES LEFT MIDDLE RING AND SMALL FINGERS;  Surgeon: Betha LoaKevin Kuzma, MD;  Location: [facility];  Service: Orthopedics;  Laterality: Left;  ? COLONOSCOPY    ? 2016  ? HEMORROIDECTOMY    ? INTRAUTERINE DEVICE INSERTION    ? ?Social History  ? ?Tobacco Use  ? Smoking status: Never  ? Smokeless tobacco: Never  ?Vaping Use  ? Vaping Use: Never used  ?Substance Use Topics  ? Alcohol use: No  ? Drug use: No  ? ?Family History  ?Problem Relation Age of Onset  ? Colon cancer Neg Hx   ? Esophageal cancer Neg Hx   ? Rectal cancer Neg Hx   ? Stomach cancer Neg Hx   ? ?Allergies  ?Allergen Reactions  ? Oxycodone Itching  ?  Patient said that she doesn't have problems with it 05/13/2019  ? ?Patient Active Problem List  ? Diagnosis Date Noted  ? Myalgia 09/12/2021  ? Dyspepsia 09/12/2021  ? DNR (do not resuscitate) discussion 09/12/2021  ? Routine general medical examination at a health care facility 10/22/2020  ? Urinary incontinence 10/22/2020  ? GERD (gastroesophageal reflux disease) 05/22/2019  ? Mood disorder (HCC) 09/09/2018  ? 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 Frequent headaches 10/18/2017  ? Symptoms, such as flushing, sleeplessness, headache, lack of concentration, associated with the menopause 10/18/2017  ? Iron deficiency anemia 09/09/2014  ? Constipation 12/13/2009  ? Vitamin D deficiency 12/13/2009  ? Anemia, unspecified 12/03/2009  ? Allergic rhinitis 12/01/2009  ? Attention or concentration deficit 12/01/2009  ? Migraine headache 12/01/2009  ? ?Past Medical History:  ?Diagnosis Date  ? History of chicken pox   ? Medical history non-contributory   ? Wears glasses   ? ?Past Surgical History:   ?Procedure Laterality Date  ? CESAREAN SECTION    ? CLOSED REDUCTION METACARPAL WITH PERCUTANEOUS PINNING Left 07/28/2014  ? Procedure: CLOSED REDUCTION METACARPAL WITH PERCUTANEOUS PINNING PROXIMAL PHALANX FRACTURES LEFT MIDDLE RING AND SMALL FINGERS;  Surgeon: Betha LoaKevin Kuzma, MD;  Location: [facility];  Service: Orthopedics;  Laterality: Left;  ? COLONOSCOPY    ? 2016  ? HEMORROIDECTOMY    ? INTRAUTERINE DEVICE INSERTION    ? ?Social History  ? ?Tobacco Use  ? Smoking status: Never  ? Smokeless tobacco: Never  ?Vaping Use  ? Vaping Use: Never used  ?Substance Use Topics  ? Alcohol use: No  ? Drug use: No  ? ?Family History  ?Problem Relation Age of Onset  ? Colon cancer Neg Hx   ? Esophageal cancer Neg Hx   ? Rectal cancer Neg Hx   ? Stomach cancer Neg Hx   ? ?Allergies  ?Allergen Reactions  ? Oxycodone Itching  ?  Patient said that she doesn't have problems with it 05/13/2019  ? ? ? ? ? ? ?Review of  Systems  ?Constitutional:  Positive for fatigue. Negative for activity change, appetite change, fever and unexpected weight change.  ?HENT:  Negative for congestion, ear pain, rhinorrhea, sinus pressure and sore throat.   ?Eyes:  Negative for pain, redness and visual disturbance.  ?Respiratory:  Negative for cough, shortness of breath and wheezing.   ?Cardiovascular:  Negative for chest pain and palpitations.  ?Gastrointestinal:  Negative for abdominal pain, blood in stool, constipation and diarrhea.  ?Endocrine: Negative for polydipsia and polyuria.  ?Genitourinary:  Negative for dysuria, frequency and urgency.  ?Musculoskeletal:  Positive for myalgias. Negative for arthralgias and back pain.  ?Skin:  Negative for pallor and rash.  ?Allergic/Immunologic: Negative for environmental allergies.  ?Neurological:  Negative for dizziness, syncope and headaches.  ?Hematological:  Negative for adenopathy. Does not bruise/bleed easily.  ?Psychiatric/Behavioral:  Negative for decreased concentration and dysphoric  mood. The patient is not nervous/anxious.   ? ?   ?Objective:  ? Physical Exam ?Constitutional:   ?   General: She is not in acute distress. ?   Appearance: Normal appearance. She is well-developed and nor

## 2021-09-13 ENCOUNTER — Other Ambulatory Visit: Payer: Self-pay

## 2021-09-13 ENCOUNTER — Inpatient Hospital Stay: Payer: No Typology Code available for payment source | Attending: Adult Health

## 2021-09-13 ENCOUNTER — Telehealth: Payer: Self-pay | Admitting: *Deleted

## 2021-09-13 ENCOUNTER — Emergency Department (HOSPITAL_COMMUNITY)
Admission: EM | Admit: 2021-09-13 | Discharge: 2021-09-14 | Disposition: A | Discharge status: Home / Self Care | Payer: No Typology Code available for payment source | Attending: Emergency Medicine | Admitting: Emergency Medicine

## 2021-09-13 ENCOUNTER — Telehealth: Payer: Self-pay | Admitting: Adult Health

## 2021-09-13 ENCOUNTER — Other Ambulatory Visit: Payer: Self-pay | Admitting: *Deleted

## 2021-09-13 ENCOUNTER — Encounter (HOSPITAL_COMMUNITY): Payer: Self-pay | Admitting: Emergency Medicine

## 2021-09-13 DIAGNOSIS — R000 Tachycardia, unspecified: Secondary | ICD-10-CM | POA: Insufficient documentation

## 2021-09-13 DIAGNOSIS — Z20822 Contact with and (suspected) exposure to covid-19: Secondary | ICD-10-CM | POA: Insufficient documentation

## 2021-09-13 DIAGNOSIS — R112 Nausea with vomiting, unspecified: Secondary | ICD-10-CM

## 2021-09-13 DIAGNOSIS — R1084 Generalized abdominal pain: Secondary | ICD-10-CM | POA: Insufficient documentation

## 2021-09-13 DIAGNOSIS — R5383 Other fatigue: Secondary | ICD-10-CM | POA: Diagnosis present

## 2021-09-13 DIAGNOSIS — E876 Hypokalemia: Secondary | ICD-10-CM | POA: Insufficient documentation

## 2021-09-13 DIAGNOSIS — E860 Dehydration: Secondary | ICD-10-CM | POA: Diagnosis not present

## 2021-09-13 DIAGNOSIS — D508 Other iron deficiency anemias: Secondary | ICD-10-CM

## 2021-09-13 LAB — CBC WITH DIFFERENTIAL (CANCER CENTER ONLY)
Abs Immature Granulocytes: 0.01 10*3/uL (ref 0.00–0.07)
Basophils Absolute: 0 10*3/uL (ref 0.0–0.1)
Basophils Relative: 1 %
Eosinophils Absolute: 0.1 10*3/uL (ref 0.0–0.5)
Eosinophils Relative: 2 %
HCT: 43.4 % (ref 36.0–46.0)
Hemoglobin: 13.8 g/dL (ref 12.0–15.0)
Immature Granulocytes: 0 %
Lymphocytes Relative: 38 %
Lymphs Abs: 2 10*3/uL (ref 0.7–4.0)
MCH: 25.2 pg — ABNORMAL LOW (ref 26.0–34.0)
MCHC: 31.8 g/dL (ref 30.0–36.0)
MCV: 79.3 fL — ABNORMAL LOW (ref 80.0–100.0)
Monocytes Absolute: 0.7 10*3/uL (ref 0.1–1.0)
Monocytes Relative: 14 %
Neutro Abs: 2.4 10*3/uL (ref 1.7–7.7)
Neutrophils Relative %: 45 %
Platelet Count: 380 10*3/uL (ref 150–400)
RBC: 5.47 MIL/uL — ABNORMAL HIGH (ref 3.87–5.11)
RDW: 13.5 % (ref 11.5–15.5)
WBC Count: 5.3 10*3/uL (ref 4.0–10.5)
nRBC: 0 % (ref 0.0–0.2)

## 2021-09-13 LAB — BASIC METABOLIC PANEL
Anion gap: 10 (ref 5–15)
BUN: 21 mg/dL — ABNORMAL HIGH (ref 6–20)
CO2: 29 mmol/L (ref 22–32)
Calcium: 9.6 mg/dL (ref 8.9–10.3)
Chloride: 96 mmol/L — ABNORMAL LOW (ref 98–111)
Creatinine, Ser: 0.99 mg/dL (ref 0.44–1.00)
GFR, Estimated: >60 mL/min (ref >60)
Glucose, Bld: 91 mg/dL (ref 70–99)
Potassium: 3 mmol/L — ABNORMAL LOW (ref 3.5–5.1)
Sodium: 135 mmol/L (ref 135–145)

## 2021-09-13 LAB — CBC
HCT: 43.6 % (ref 36.0–46.0)
Hemoglobin: 14 g/dL (ref 12.0–15.0)
MCH: 26 pg (ref 26.0–34.0)
MCHC: 32.1 g/dL (ref 30.0–36.0)
MCV: 80.9 fL (ref 80.0–100.0)
Platelets: 362 10*3/uL (ref 150–400)
RBC: 5.39 MIL/uL — ABNORMAL HIGH (ref 3.87–5.11)
RDW: 13.6 % (ref 11.5–15.5)
WBC: 5 10*3/uL (ref 4.0–10.5)
nRBC: 0 % (ref 0.0–0.2)

## 2021-09-13 LAB — IRON AND IRON BINDING CAPACITY (CC-WL,HP ONLY)
Iron: 115 ug/dL (ref 28–170)
Saturation Ratios: 22 % (ref 10.4–31.8)
TIBC: 514 ug/dL — ABNORMAL HIGH (ref 250–450)
UIBC: 399 ug/dL (ref 148–442)

## 2021-09-13 LAB — FERRITIN: Ferritin: 79 ng/mL (ref 11–307)

## 2021-09-13 LAB — TYPE AND SCREEN
ABO/RH(D): O POS
Antibody Screen: NEGATIVE

## 2021-09-13 MED ORDER — MORPHINE SULFATE (PF) 2 MG/ML IV SOLN
2.0000 mg | Freq: Once | INTRAVENOUS | Status: AC
  Administered 2021-09-13: 2 mg via INTRAVENOUS
  Filled 2021-09-13: qty 1

## 2021-09-13 MED ORDER — ONDANSETRON HCL 4 MG/2ML IJ SOLN
4.0000 mg | Freq: Once | INTRAMUSCULAR | Status: AC
  Administered 2021-09-13: 4 mg via INTRAVENOUS
  Filled 2021-09-13: qty 2

## 2021-09-13 MED ORDER — POTASSIUM CHLORIDE CRYS ER 20 MEQ PO TBCR
40.0000 meq | EXTENDED_RELEASE_TABLET | Freq: Once | ORAL | Status: AC
  Administered 2021-09-13: 40 meq via ORAL
  Filled 2021-09-13: qty 2

## 2021-09-13 MED ORDER — KETOROLAC TROMETHAMINE 15 MG/ML IJ SOLN
15.0000 mg | Freq: Once | INTRAMUSCULAR | Status: AC
Start: 2021-09-13 — End: 2021-09-13
  Administered 2021-09-13: 15 mg via INTRAVENOUS
  Filled 2021-09-13: qty 1

## 2021-09-13 MED ORDER — LACTATED RINGERS IV BOLUS
1000.0000 mL | Freq: Once | INTRAVENOUS | Status: AC
  Administered 2021-09-13: 1000 mL via INTRAVENOUS

## 2021-09-13 NOTE — ED Notes (Signed)
IV attempted x2 without success.

## 2021-09-13 NOTE — ED Triage Notes (Signed)
Per pt, states she has a history of anemia-states she is suppose to have lab work at the cancer center today and then schedule an appointment for an iron transfusion-states she is too weak and was told to come to ED for transfusion ?

## 2021-09-13 NOTE — Telephone Encounter (Signed)
Scheduled appt per 4/10 referral. Pt was already established with Dr. Alen Blew. Per Dr. Alen Blew, pt okay to see APP, suggested Wilber Bihari. Pt is aware of appt date and time. Pt is aware to arrive 15 mins prior to appt time and to bring and updated insurance card. Pt is aware of appt location.   ?

## 2021-09-13 NOTE — Telephone Encounter (Signed)
-----   Message from Benjiman Core, MD sent at 09/13/2021  1:15 PM EDT ----- ?Please let her know that all her labs are normal including iron studies.  She does not require any iron infusion.  Her symptoms are unrelated to blood or iron. ?

## 2021-09-13 NOTE — Telephone Encounter (Signed)
Notified of message below. States that she is in the ER now. ?

## 2021-09-14 ENCOUNTER — Emergency Department (HOSPITAL_COMMUNITY): Payer: No Typology Code available for payment source

## 2021-09-14 LAB — RESP PANEL BY RT-PCR (FLU A&B, COVID) ARPGX2
Influenza A by PCR: NEGATIVE
Influenza B by PCR: NEGATIVE
SARS Coronavirus 2 by RT PCR: NEGATIVE

## 2021-09-14 LAB — URINALYSIS, ROUTINE W REFLEX MICROSCOPIC
Bilirubin Urine: NEGATIVE
Glucose, UA: NEGATIVE mg/dL
Ketones, ur: 20 mg/dL — AB
Leukocytes,Ua: NEGATIVE
Nitrite: NEGATIVE
Protein, ur: 30 mg/dL — AB
Specific Gravity, Urine: 1.029 (ref 1.005–1.030)
pH: 5 (ref 5.0–8.0)

## 2021-09-14 LAB — HEPATIC FUNCTION PANEL
ALT: 22 U/L (ref 0–44)
AST: 31 U/L (ref 15–41)
Albumin: 3.9 g/dL (ref 3.5–5.0)
Alkaline Phosphatase: 38 U/L (ref 38–126)
Bilirubin, Direct: 0.1 mg/dL (ref 0.0–0.2)
Indirect Bilirubin: 0.6 mg/dL (ref 0.3–0.9)
Total Bilirubin: 0.7 mg/dL (ref 0.3–1.2)
Total Protein: 7.7 g/dL (ref 6.5–8.1)

## 2021-09-14 LAB — TSH: TSH: 2.214 u[IU]/mL (ref 0.350–4.500)

## 2021-09-14 LAB — CK: Total CK: 148 U/L (ref 38–234)

## 2021-09-14 LAB — MAGNESIUM: Magnesium: 2 mg/dL (ref 1.7–2.4)

## 2021-09-14 MED ORDER — POTASSIUM CHLORIDE CRYS ER 20 MEQ PO TBCR: 20.0000 meq | EXTENDED_RELEASE_TABLET | Freq: Every day | ORAL | 0 refills | Status: DC

## 2021-09-14 MED ORDER — DICYCLOMINE HCL 20 MG PO TABS
20.0000 mg | ORAL_TABLET | Freq: Two times a day (BID) | ORAL | 0 refills | Status: DC
Start: 2021-09-14 — End: 2021-09-15

## 2021-09-14 MED ORDER — ONDANSETRON 4 MG PO TBDP: 4.0000 mg | ORAL_TABLET | Freq: Three times a day (TID) | ORAL | 0 refills | Status: DC | PRN

## 2021-09-14 NOTE — ED Provider Notes (Signed)
?[facility]-EMERGENCY DEPT ?Provider Note ? ? ?CSN: [id number] ?Arrival date & time: 09/13/21  1224 ? ?  ? ?History ? ?Chief Complaint  ?Patient presents with  ? Fatigue  ? ? ?Crystal Morris is a 56 y.o. female. ? ?Patient is a 56 year old female with past medical history significant for iron deficiency anemia receiving iron infusions. ? ?Patient is here in the emergency room today with complaints of generalized abdominal pain, fatigue, nausea, vomiting. ? ?Seems that she has had symptoms similar to this for approximately the past 1 month.  Notably she was seen 3/19 and left wrist with a diagnosis of gastroenteritis and dehydration.  She was seen again 4/6. ? ?She presents to the ER today stating that she ran out of her medications that she was discharged with at her last visit ? ?She states that over the weekend she developed more abdominal pain.  She states she has been somewhat nauseous over the past month.  She states that she had 3-4 episodes of nonbloody nonbilious emesis over the weekend.  She feels nauseous currently but has not had any emesis today. ? ?Denies any urinary fevers urgency dysuria hematuria.  Denies any chest pain or difficulty breathing.  No fevers at home.  States that she has been eating food but not drinking very much water. ? ?  ? ?Home Medications ?   ? ?Allergies    ?Oxycodone   ? ?Review of Systems   ?Review of Systems ? ?Physical Exam ?Updated Vital Signs ?BP (!) 137/106   Pulse 91   Temp 98.4 ?F (36.9 ?C) (Oral)   Resp 17   SpO2 99%  ?Physical Exam ?Vitals and nursing note reviewed.  ?Constitutional:   ?   Comments: Uncomfortable appearing 56 year old female ?Able to speak in full sentences, answers questions appropriately  ?HENT:  ?   Head: Normocephalic and atraumatic.  ?   Nose: Nose normal.  ?   Mouth/Throat:  ?   Mouth: Mucous membranes are dry.  ?Eyes:  ?   General: No scleral icterus. ?Cardiovascular:  ?   Rate and Rhythm: Normal rate and regular rhythm.   ?   Pulses: Normal pulses.  ?   Heart sounds: Normal heart sounds.  ?Pulmonary:  ?   Effort: Pulmonary effort is normal. No respiratory distress.  ?   Breath sounds: No wheezing.  ?Abdominal:  ?   Palpations: Abdomen is soft.  ?   Tenderness: There is no abdominal tenderness. There is no guarding or rebound.  ?   Comments: Endorses abdominal discomfort but palpation of abdomen does not exacerbate this.  No guarding or rebound.  ?Musculoskeletal:  ?   Cervical back: Normal range of motion.  ?   Right lower leg: No edema.  ?   Left lower leg: No edema.  ?   Comments: No notable lower extremity edema  ?Skin: ?   General: Skin is warm and dry.  ?   Capillary Refill: Capillary refill takes less than 2 seconds.  ?   Comments: No rashes  ?Neurological:  ?   Mental Status: She is alert. Mental status is at baseline.  ?   Comments: Alert and oriented to self, place, time and event.  ? ?Speech is fluent, clear without dysarthria or dysphasia.  ? ?Strength 5/5 in upper/lower extremities   ?Sensation intact in upper/lower extremities  ?  ?CN I not tested  ?CN II grossly intact visual fields bilaterally. Did not visualize posterior eye.  ?CN III,  IV, VI PERRLA and EOMs intact bilaterally  ?CN V Intact sensation to sharp and light touch to the face  ?CN VII facial movements symmetric  ?CN VIII not tested  ?CN IX, X no uvula deviation, symmetric rise of soft palate  ?CN XI 5/5 SCM and trapezius strength bilaterally  ?CN XII Midline tongue protrusion, symmetric L/R movements  ?  ?Psychiatric:     ?   Mood and Affect: Mood normal.     ?   Behavior: Behavior normal.  ? ? ?ED Results / Procedures / Treatments   ?Labs ?(all labs ordered are listed, but only abnormal results are displayed) ?Labs Reviewed  ?CBC - Abnormal; Notable for the following components:  ?    Result Value  ? RBC 5.39 (*)   ? All other components within normal limits  ?BASIC METABOLIC PANEL - Abnormal; Notable for the following components:  ? Potassium 3.0 (*)   ?  Chloride 96 (*)   ? BUN 21 (*)   ? All other components within normal limits  ?URINALYSIS, ROUTINE W REFLEX MICROSCOPIC - Abnormal; Notable for the following components:  ? APPearance HAZY (*)   ? Hgb urine dipstick SMALL (*)   ? Ketones, ur 20 (*)   ? Protein, ur 30 (*)   ? Bacteria, UA RARE (*)   ? All other components within normal limits  ?RESP PANEL BY RT-PCR (FLU A&B, COVID) ARPGX2  ?TSH  ?CK  ?HEPATIC FUNCTION PANEL  ?MAGNESIUM  ?TYPE AND SCREEN  ? ? ?EKG ?EKG Interpretation ? ?Date/Time:  Tuesday September 13 2021 22:29:47 EDT ?Ventricular Rate:  104 ?PR Interval:  163 ?QRS Duration: 76 ?QT Interval:  310 ?QTC Calculation: 408 ?R Axis:   71 ?Text Interpretation: Sinus tachycardia Biatrial enlargement Nonspecific T abnormalities, anterior leads Confirmed by Ernie AvenaLawsing, James ([id number]) on 09/13/2021 10:35:51 PM ? ?Radiology ?DG Chest 2 View ? ?Result Date: 09/14/2021 ?CLINICAL DATA:  Shortness of breath and weakness, initial encounter EXAM: CHEST - 2 VIEW COMPARISON:  08/11/2014 FINDINGS: The heart size and mediastinal contours are within normal limits. Both lungs are clear. The visualized skeletal structures are unremarkable. IMPRESSION: No active cardiopulmonary disease. Electronically Signed   By: Alcide CleverMark  Lukens M.D.   On: 09/14/2021 00:54   ? ?Procedures ?Procedures  ? ? ?Medications Ordered in ED ?Medications  ?potassium chloride SA (KLOR-CON M) CR tablet 40 mEq (40 mEq Oral Given 09/13/21 2331)  ?lactated ringers bolus 1,000 mL (1,000 mLs Intravenous New Bag/Given 09/13/21 2330)  ?morphine (PF) 2 MG/ML injection 2 mg (2 mg Intravenous Given 09/13/21 2327)  ?ketorolac (TORADOL) 15 MG/ML injection 15 mg (15 mg Intravenous Given 09/13/21 2327)  ?ondansetron [facility](ZOFRAN) injection 4 mg (4 mg Intravenous Given 09/13/21 2326)  ? ? ?ED Course/ Medical Decision Making/ A&P ?Clinical Course as of 09/14/21 0117  ?Tue Sep 13, 2021  ?2252 Generalized body pain since 4 days.  ? [WF]  ?Wed Sep 14, 2021  ?0110 Potassium(!): 3.0 [WF]  ?   ?Clinical Course User Index ?[WF] Gailen ShelterFondaw, Jaquese Irving S, [place]  ? ?                        ?Medical Decision Making ?Amount and/or Complexity of Data Reviewed ?Labs: ordered. Decision-making details documented in ED Course. ?Radiology: ordered. ? ?Risk ?Prescription drug management. ? ? ?This patient presents to the ED for concern of fatigue, abd pain, nausea, vomiting this involves a number of treatment options, and is a complaint that carries  with it a high risk of complications and morbidity.  The differential diagnosis of weakness includes but is not limited to neurologic causes (GBS, myasthenia gravis, CVA, MS, ALS, transverse myelitis, spinal cord injury, CVA, botulism, ) and other causes: ACS, Arrhythmia, syncope, orthostatic hypotension, sepsis, hypoglycemia, electrolyte disturbance, hypothyroidism, respiratory failure, symptomatic anemia, dehydration, heat injury, polypharmacy, malignancy. ? ?The causes of generalized abdominal pain include but are not limited to AAA, mesenteric ischemia, appendicitis, diverticulitis, DKA, gastritis, gastroenteritis, AMI, nephrolithiasis, pancreatitis, peritonitis, adrenal insufficiency,lead poisoning, iron toxicity, intestinal ischemia, constipation, UTI,SBO/LBO, splenic rupture, biliary disease, IBD, IBS, PUD, or hepatitis. ?Ectopic pregnancy, ovarian torsion, PID.  ? ? ?Co morbidities: ?Discussed in HPI ? ? ?Brief History: ? ?Patient is a 56 year old female with past medical history significant for iron deficiency anemia receiving iron infusions. ? ?Patient is here in the emergency room today with complaints of generalized abdominal pain, fatigue, nausea, vomiting. ? ?Seems that she has had symptoms similar to this for approximately the past 1 month.  Notably she was seen 3/19 and left wrist with a diagnosis of gastroenteritis and dehydration.  She was seen again 4/6. ? ?She presents to the ER today stating that she ran out of her medications that she was discharged with at her  last visit ? ?She states that over the weekend she developed more abdominal pain.  She states she has been somewhat nauseous over the past month.  She states that she had 3-4 episodes of nonbloody nonbilious

## 2021-09-14 NOTE — Discharge Instructions (Addendum)
Please take Zofran as needed for nausea.  As we discussed I recommend taking 1 dose tomorrow morning.  I recommend a bland diet.  I have also prescribed you medication called Bentyl which I would like you to take this should help with pain.  May also alternate Tylenol and ibuprofen at home.  Please follow-up with your primary care provider within 1 week to recheck your electrolytes.  Your potassium was found to be low today.  This can certainly cause some nausea and you did appear to be somewhat dehydrated on exam as well. ? ?Please use Tylenol or ibuprofen for pain.  You may use 600 mg ibuprofen every 6 hours or 1000 mg of Tylenol every 6 hours.  You may choose to alternate between the 2.  This would be most effective.  Not to exceed 4 g of Tylenol within 24 hours.  Not to exceed 3200 mg ibuprofen 24 hours.  ?

## 2021-09-15 ENCOUNTER — Other Ambulatory Visit (INDEPENDENT_AMBULATORY_CARE_PROVIDER_SITE_OTHER): Payer: No Typology Code available for payment source

## 2021-09-15 ENCOUNTER — Encounter: Payer: Self-pay | Admitting: Physician Assistant

## 2021-09-15 ENCOUNTER — Other Ambulatory Visit (HOSPITAL_COMMUNITY): Payer: Self-pay

## 2021-09-15 ENCOUNTER — Ambulatory Visit (INDEPENDENT_AMBULATORY_CARE_PROVIDER_SITE_OTHER): Payer: No Typology Code available for payment source | Admitting: Physician Assistant

## 2021-09-15 VITALS — BP 138/82 | HR 91 | Ht 62.0 in | Wt 145.4 lb

## 2021-09-15 DIAGNOSIS — E876 Hypokalemia: Secondary | ICD-10-CM

## 2021-09-15 DIAGNOSIS — R109 Unspecified abdominal pain: Secondary | ICD-10-CM

## 2021-09-15 DIAGNOSIS — R112 Nausea with vomiting, unspecified: Secondary | ICD-10-CM | POA: Diagnosis not present

## 2021-09-15 DIAGNOSIS — K315 Obstruction of duodenum: Secondary | ICD-10-CM | POA: Diagnosis not present

## 2021-09-15 DIAGNOSIS — Z8711 Personal history of peptic ulcer disease: Secondary | ICD-10-CM

## 2021-09-15 LAB — CBC WITH DIFFERENTIAL/PLATELET
Basophils Absolute: 0 10*3/uL (ref 0.0–0.1)
Basophils Relative: 0.6 % (ref 0.0–3.0)
Eosinophils Absolute: 0.1 10*3/uL (ref 0.0–0.7)
Eosinophils Relative: 1.3 % (ref 0.0–5.0)
HCT: 38 % (ref 36.0–46.0)
Hemoglobin: 11.9 g/dL — ABNORMAL LOW (ref 12.0–15.0)
Lymphocytes Relative: 20.5 % (ref 12.0–46.0)
Lymphs Abs: 1.4 10*3/uL (ref 0.7–4.0)
MCHC: 31.3 g/dL (ref 30.0–36.0)
MCV: 80.8 fl (ref 78.0–100.0)
Monocytes Absolute: 1.1 10*3/uL — ABNORMAL HIGH (ref 0.1–1.0)
Monocytes Relative: 15.2 % — ABNORMAL HIGH (ref 3.0–12.0)
Neutro Abs: 4.4 10*3/uL (ref 1.4–7.7)
Neutrophils Relative %: 62.4 % (ref 43.0–77.0)
Platelets: 258 10*3/uL (ref 150.0–400.0)
RBC: 4.71 Mil/uL (ref 3.87–5.11)
RDW: 13.7 % (ref 11.5–15.5)
WBC: 7 10*3/uL (ref 4.0–10.5)

## 2021-09-15 LAB — COMPREHENSIVE METABOLIC PANEL
ALT: 16 U/L (ref 0–35)
AST: 23 U/L (ref 0–37)
Albumin: 4.1 g/dL (ref 3.5–5.2)
Alkaline Phosphatase: 37 U/L — ABNORMAL LOW (ref 39–117)
BUN: 20 mg/dL (ref 6–23)
CO2: 29 mEq/L (ref 19–32)
Calcium: 9.6 mg/dL (ref 8.4–10.5)
Chloride: 97 mEq/L (ref 96–112)
Creatinine, Ser: 1.33 mg/dL — ABNORMAL HIGH (ref 0.40–1.20)
GFR: 44.64 mL/min — ABNORMAL LOW (ref >60.00)
Glucose, Bld: 61 mg/dL — ABNORMAL LOW (ref 70–99)
Potassium: 3.9 mEq/L (ref 3.5–5.1)
Sodium: 134 mEq/L — ABNORMAL LOW (ref 135–145)
Total Bilirubin: 0.6 mg/dL (ref 0.2–1.2)
Total Protein: 7.4 g/dL (ref 6.0–8.3)

## 2021-09-15 MED ORDER — ONDANSETRON 4 MG PO TBDP
4.0000 mg | ORAL_TABLET | Freq: Four times a day (QID) | ORAL | 0 refills | Status: DC | PRN
  Filled 2021-09-15: qty 20, 5d supply, fill #0

## 2021-09-15 MED ORDER — ONDANSETRON 4 MG PO TBDP: 4.0000 mg | ORAL_TABLET | Freq: Four times a day (QID) | ORAL | 0 refills | Status: DC | PRN

## 2021-09-15 MED ORDER — OMEPRAZOLE 40 MG PO CPDR
40.0000 mg | DELAYED_RELEASE_CAPSULE | Freq: Two times a day (BID) | ORAL | 6 refills | Status: DC
  Filled 2021-09-15: qty 60, 30d supply, fill #0
  Filled 2021-10-18: qty 60, 30d supply, fill #1
  Filled 2021-11-16: qty 60, 30d supply, fill #2
  Filled 2022-01-03: qty 60, 30d supply, fill #3
  Filled 2022-01-31: qty 60, 30d supply, fill #4
  Filled 2022-03-13: qty 60, 30d supply, fill #5
  Filled 2022-04-18: qty 60, 30d supply, fill #6

## 2021-09-15 NOTE — Progress Notes (Signed)
? ?Subjective:  ? ? Patient ID: Crystal Morris, female    DOB: 04-23-1966, 56 y.o.   MRN: [id number] ? ?HPI ?Crystal Morris is a 56 year old African-American female, established with Dr. Chales AbrahamsGupta, however not seen in our office since November 2020 when she had undergone EGD for complaints of dark stools, and iron deficiency anemia.  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  Biopsies were negative for H. pylori. ?She also had colonoscopy at that same time which was normal other than redundancy and small internal hemorrhoids. ?She did undergo upper GI in December 2020 which showed a duodenal stricture, 8 mm in length and 4 to 5 mm caliber.  She was supposed to be scheduled for follow-up EGD but did not follow through with that. ? ?She says that she has stayed on Protonix ever since that time, generally once daily but was just increased to twice daily again earlier this week by her PCP.  I am not clear on whether she continued Pepcid over the past couple of years but is currently on Pepcid 20 mg nightly. ?She has had a recent ER visits, the last on 09/13/2021 with complaints of abdominal pain, nausea vomiting fatigue, aching all over.  She also relates that she has been having some diarrhea, no melena or hematochezia. ?She had undergone noncontrasted CT on 09/08/2021 which was unremarkable. ?Labs on 09/13/2021 hemoglobin 14/hematocrit of 43 ?Potassium 3.0/magnesium within normal limits ?CK and TSH were within normal limits, LFTs normal. ? ?Of note patient has Excedrin on her med list and tells me that she usually takes 1-2 Excedrin PM at bedtime and has done this long-term and takes 2 Excedrin every 8 hours during the day long-term.  (Acetaminophen aspirin and caffeine) ? ?She says over this past week she has been feeling terrible, has Zofran at home which she has not been taking on any sort of scheduled dosing  but has continued to have problems with nausea and vomiting.  She says everything she eats hits her stomach and she "spits it back up".  She is having ongoing upper abdominal discomfort, some mild diarrhea after eating she has had some difficulty keeping her meds down. ?She was given potassium supplement after the most recent hospitalization with potassium of 3.  She is still taking an oral supplement.  She has been taken out of work until 09/19/2021. ? ?Review of Systems Pertinent positive and negative review of systems were noted in the above HPI section.  All other review of systems was otherwise negative.  ? ?Outpatient Encounter Medications as of 09/15/2021  ?Medication Sig  ? aspirin-acetaminophen-caffeine (EXCEDRIN MIGRAINE) 250-250-65 MG tablet Take by mouth every 6 (six) hours as needed for headache.  ? diphenhydrAMINE (BENADRYL) 25 mg capsule Take 25 mg by mouth daily as needed for itching, allergies or sleep.  ? ergocalciferol (VITAMIN D2) 1.25 MG (50000 UT) capsule Take 1 capsule (50,000 Units total) by mouth once a week.  ? escitalopram (LEXAPRO) 20 MG tablet TAKE 1 TABLET BY MOUTH ONCE A DAY **MUST SCHEDULE PHYSICAL EXAM  ? famotidine (PEPCID) 20 MG tablet TAKE 1 TABLET BY MOUTH EVERY EVENING AS DIRECTED  ? omeprazole (PRILOSEC) 40 MG capsule Take 1 capsule by mouth 2 times daily.  ? Polyethyl Glycol-Propyl Glycol (SYSTANE OP) Place 1 drop into both eyes daily.   ? polyethylene glycol powder (GLYCOLAX/MIRALAX) 17 GM/SCOOP powder Take 0.5 Containers by  mouth daily as needed for moderate constipation or mild constipation.  ? potassium chloride SA (KLOR-CON M) 20 MEQ tablet Take 1 tablet (20 mEq total) by mouth daily.  ? PREMPRO 0.3-1.5 MG tablet TAKE 1 TABLET BY MOUTH DAILY.  ? traMADol (ULTRAM) 50 MG tablet Take 1 tablet by mouth every 8 hours as needed for up to 5 days.  ? zolpidem (AMBIEN CR) 12.5 MG CR tablet Take 1 tablet (12.5 mg total) by mouth at bedtime.  ? [DISCONTINUED] ondansetron (ZOFRAN-ODT)  4 MG disintegrating tablet Take 1 tablet (4 mg total) by mouth every 8 (eight) hours as needed for nausea or vomiting.  ? [DISCONTINUED] pantoprazole (PROTONIX) 40 MG tablet TAKE 1 TABLET BY MOUTH 2 TIMES DAILY.  ? ondansetron (ZOFRAN-ODT) 4 MG disintegrating tablet Take 1 tablet (4 mg total) by mouth every 6 (six) hours as needed for nausea or vomiting.  ? [DISCONTINUED] dicyclomine (BENTYL) 20 MG tablet Take 1 every 8 hours as needed for abdominal cramp (Patient not taking: Reported on 09/08/2021)  ? [DISCONTINUED] dicyclomine (BENTYL) 20 MG tablet Take 1 tablet (20 mg total) by mouth 2 (two) times daily.  ? [DISCONTINUED] ondansetron (ZOFRAN-ODT) 4 MG disintegrating tablet Take 1 tablet (4 mg total) by mouth every 6 (six) hours as needed for nausea or vomiting.  ? ?No facility-administered encounter medications on file as of 09/15/2021.  ? ?Allergies  ?Allergen Reactions  ? Oxycodone Itching  ?  Patient said that she doesn't have problems with it 05/13/2019  ? ?Patient Active Problem List  ? Diagnosis Date Noted  ? Myalgia 09/12/2021  ? Dyspepsia 09/12/2021  ? DNR (do not resuscitate) discussion 09/12/2021  ? Routine general medical examination at a health care facility 10/22/2020  ? Urinary incontinence 10/22/2020  ? GERD (gastroesophageal reflux disease) 05/22/2019  ? Mood disorder (HCC) 09/09/2018  ? 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 Frequent headaches 10/18/2017  ? Symptoms, such as flushing, sleeplessness, headache, lack of concentration, associated with the menopause 10/18/2017  ? Iron deficiency anemia 09/09/2014  ? Constipation 12/13/2009  ? Vitamin D deficiency 12/13/2009  ? Anemia, unspecified 12/03/2009  ? Allergic rhinitis 12/01/2009  ? Attention or concentration deficit 12/01/2009  ? Migraine headache 12/01/2009  ? ?Social History  ? ?Socioeconomic History  ? Marital status: Married  ?  Spouse name: Not on file  ? Number of children: Not on file  ? Years of education: Not on file  ? Highest education level:  Not on file  ?Occupational History  ? Not on file  ?Tobacco Use  ? Smoking status: Never  ? Smokeless tobacco: Never  ?Vaping Use  ? Vaping Use: Never used  ?Substance and Sexual Activity  ? Alcohol use: No  ? Drug use: No  ? Sexual activity: Not on file  ?Other Topics Concern  ? Not on file  ?Social History Narrative  ? Not on file  ? ?Social Determinants of Health  ? ?Financial Resource Strain: Not on file  ?Food Insecurity: Not on file  ?Transportation Needs: Not on file  ?Physical Activity: Not on file  ?Stress: Not on file  ?Social Connections: Not on file  ?Intimate Partner Violence: Not on file  ? ? ?Crystal Morris's family history is not on file. ? ? ?   ?Objective:  ?  ?Vitals:  ? 09/15/21 1505  ?BP: 138/82  ?Pulse: 91  ? ? ?Physical Exam Well-developed African-American female in no acute distress.  Accompanied by her husband , Weight, 145 BMI 26.5  ? ?HEENT;  nontraumatic normocephalic, EOMI, PE R LA, sclera anicteric. ?Oropharynx; not examined today ?Neck; supple, no JVD ?Cardiovascular; regular rate and rhythm with S1-S2, no murmur rub or gallop ?Pulmonary; Clear bilaterally ?Abdomen; soft,  nondistended, she is tender across the epigastrium and hypogastrium, no succussion splash, no palpable mass or hepatosplenomegaly, bowel sounds are active ?Rectal; not done today ?Skin; benign exam, no jaundice rash or appreciable lesions ?Extremities; no clubbing cyanosis or edema skin warm and dry ?Neuro/Psych; alert and oriented x4, grossly nonfocal mood and affect appropriate  ? ? ? ?   ?Assessment & Plan:  ? ?#48 56 year old African-American female with 4 to 6-week history of ongoing symptoms with upper abdominal pain, nausea fatigue, vomiting, poor appetite and mild diarrhea. ?Recent noncontrasted CT imaging on 09/08/2021 negative ? 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  Stricture  was also noted on upper GI December 2020. ? 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

## 2021-09-15 NOTE — Patient Instructions (Signed)
If you are age 57 or younger, your body mass index should be between 19-25. Your Body mass index is 26.59 kg/m?Marland Kitchen If this is out of the aformentioned range listed, please consider follow up with your Primary Care Provider.  ?________________________________________________________ ? ?The [facility] providers would like to encourage you to use [facility] to communicate with providers for non-urgent requests or questions.  Due to long hold times on the telephone, sending your provider a message by [facility] may be a faster and more efficient way to get a response.  Please allow 48 business hours for a response.  Please remember that this is for non-urgent requests.  ?_______________________________________________________ ? ?You have been scheduled for an endoscopy. Please follow written instructions given to you at your visit today. ?If you use inhalers (even only as needed), please bring them with you on the day of your procedure. ? ?Your provider has requested that you go to the basement level for lab work before leaving today. Press "B" on the elevator. The lab is located at the first door on the left as you exit the elevator. ? ?STOP Excedrin ?Use only Tylenol Regular strength up to 4 times a day ? ?Stop Pantoprazole ?START Omeprazole 40 mg empty 1 capsule in apple sauce or yogurt twice daily ? ?Continue Ondansetron every 6 hours as needed. ? ?Push fluids, drink protein shakes (Ensure/Boost) ? ?Follow up pending ? ?Thank you for entrusting me with your care and choosing [facility]. ? ?Mike Gip, PA-C ?

## 2021-09-15 NOTE — Progress Notes (Signed)
____________________________________________________________ ? ?Attending physician addendum: ? ?Thank you for sending this case to me and for discussing in the office today. ?I have reviewed the entire note and agree with the plan. ? ?The CT urogram from 09/08/2021 does not show any gastric dilatation and her BMP for today has not yet resulted.  I am concerned that perhaps she is not keeping down the potassium supplement or other medicines well in order to fix her hypokalemia. ?I have messaged Cathlyn Parsons of anesthesia for chart review since this procedure is scheduled 4 days from now and the issue of potassium also needs to be evaluated and addressed. ?Since you are out of the office tomorrow (Friday, with procedure scheduled for the following Monday), I will check up on the potassium level tomorrow. ? ?Amada Jupiter, MD ? ?____________________________________________________________ ? ?

## 2021-09-16 ENCOUNTER — Other Ambulatory Visit (HOSPITAL_COMMUNITY): Payer: Self-pay

## 2021-09-16 ENCOUNTER — Telehealth: Payer: Self-pay | Admitting: Gastroenterology

## 2021-09-16 DIAGNOSIS — R112 Nausea with vomiting, unspecified: Secondary | ICD-10-CM

## 2021-09-16 DIAGNOSIS — Z8711 Personal history of peptic ulcer disease: Secondary | ICD-10-CM

## 2021-09-16 DIAGNOSIS — K315 Obstruction of duodenum: Secondary | ICD-10-CM

## 2021-09-16 DIAGNOSIS — R109 Unspecified abdominal pain: Secondary | ICD-10-CM

## 2021-09-16 NOTE — Telephone Encounter (Signed)
[place], please call this patient today ? ?This patient of Dr. Chales Abrahams was seen in clinic yesterday by Mike Gip and put on my schedule for an upper endoscopy this coming Monday the 17th because I had first [facility] availability. ?She has had nausea and vomiting with difficulty maintaining oral intake and there is clinical concern for possible gastric outlet obstruction. ? ?Her labs after yesterday afternoon's visit showed improvement in the potassium level to the normal range since she was put on potassium tablets after an ED visit on April 11, but her lab results also indicate she is dehydrated with an elevated creatinine. ? ?If she is able to do so with regular use of the prescribed Zofran, she needs to increase the amount of oral fluids she is taking and so as to prevent further dehydration. ?If she is unable to keep down fluids and medicines over the weekend, she must present to the emergency department for possible hospital admission. ? ?If she is able to keep down fluids over the weekend, then he she needs to arrive at the [facility] an hour prior to her initial arrival time and go to our lab for stat BMP. ? ?-HD ? ?Elby Showers, ? ?FYI on your patient. ?

## 2021-09-16 NOTE — Telephone Encounter (Signed)
Called and spoke with patient regarding Dr. Myrtie Neither' recommendations. Pt has been advised of recommendations as outlined below. Pt knows that she will need to continue Zofran regularly and increase fluids as best as she can. Pt is aware that if she is not able to keep fluids or medications down over the weekend she will need to go to the ED for possible admission. Pt knows that if she can keep fluids down over the weekend she will need to go directly to the lab at 8 am on Monday, 09/19/21.  Pt verbalized understanding of all information and had no concerns at the end of the call. ? ?Lab order in epic. ?

## 2021-09-17 ENCOUNTER — Other Ambulatory Visit (HOSPITAL_COMMUNITY): Payer: Self-pay

## 2021-09-19 ENCOUNTER — Ambulatory Visit (AMBULATORY_SURGERY_CENTER): Payer: No Typology Code available for payment source | Admitting: Gastroenterology

## 2021-09-19 ENCOUNTER — Other Ambulatory Visit (INDEPENDENT_AMBULATORY_CARE_PROVIDER_SITE_OTHER): Payer: No Typology Code available for payment source

## 2021-09-19 ENCOUNTER — Encounter: Payer: Self-pay | Admitting: Gastroenterology

## 2021-09-19 ENCOUNTER — Encounter: Payer: Self-pay | Admitting: Oncology

## 2021-09-19 ENCOUNTER — Encounter: Payer: Self-pay | Admitting: Family Medicine

## 2021-09-19 VITALS — BP 153/91 | HR 82 | Temp 96.8°F | Resp 16 | Ht 62.0 in | Wt 145.0 lb

## 2021-09-19 DIAGNOSIS — Z8711 Personal history of peptic ulcer disease: Secondary | ICD-10-CM | POA: Diagnosis not present

## 2021-09-19 DIAGNOSIS — K315 Obstruction of duodenum: Secondary | ICD-10-CM

## 2021-09-19 DIAGNOSIS — R112 Nausea with vomiting, unspecified: Secondary | ICD-10-CM | POA: Diagnosis present

## 2021-09-19 DIAGNOSIS — R109 Unspecified abdominal pain: Secondary | ICD-10-CM | POA: Diagnosis not present

## 2021-09-19 LAB — BASIC METABOLIC PANEL
BUN: 12 mg/dL (ref 6–23)
CO2: 28 mEq/L (ref 19–32)
Calcium: 9.6 mg/dL (ref 8.4–10.5)
Chloride: 96 mEq/L (ref 96–112)
Creatinine, Ser: 1.11 mg/dL (ref 0.40–1.20)
GFR: 55.45 mL/min — ABNORMAL LOW (ref >60.00)
Glucose, Bld: 70 mg/dL (ref 70–99)
Potassium: 3.8 mEq/L (ref 3.5–5.1)
Sodium: 137 mEq/L (ref 135–145)

## 2021-09-19 MED ORDER — SODIUM CHLORIDE 0.9 % IV SOLN: 500.0000 mL | Freq: Once | INTRAVENOUS | Status: DC

## 2021-09-19 NOTE — Patient Instructions (Signed)
Eat a low fiber diet. ? ?Continue present medications. ? ? ?Avoid NSAIDS (Aspirin, Ibuprofen, Aleve, Naproxen), you may use Tylenol as needed. ? ? ?YOU HAD AN ENDOSCOPIC PROCEDURE TODAY AT THE [facility]:   Refer to the procedure report that was given to you for any specific questions about what was found during the examination.  If the procedure report does not answer your questions, please call your gastroenterologist to clarify.  If you requested that your care partner not be given the details of your procedure findings, then the procedure report has been included in a sealed envelope for you to review at your convenience later. ? ?YOU SHOULD EXPECT: Some feelings of bloating in the abdomen. Passage of more gas than usual.  Walking can help get rid of the air that was put into your GI tract during the procedure and reduce the bloating. If you had a lower endoscopy (such as a colonoscopy or flexible sigmoidoscopy) you may notice spotting of blood in your stool or on the toilet paper. If you underwent a bowel prep for your procedure, you may not have a normal bowel movement for a few days. ? ?Please Note:  You might notice some irritation and congestion in your nose or some drainage.  This is from the oxygen used during your procedure.  There is no need for concern and it should clear up in a day or so. ? ?SYMPTOMS TO REPORT IMMEDIATELY: ? ? ?Following upper endoscopy (EGD) ? Vomiting of blood or coffee ground material ? New chest pain or pain under the shoulder blades ? Painful or persistently difficult swallowing ? New shortness of breath ? Fever of 100?F or higher ? Black, tarry-looking stools ? ?For urgent or emergent issues, a gastroenterologist can be reached at any hour by calling [phone]. ?Do not use MyChart messaging for urgent concerns.  ? ? ?DIET:  We do recommend a small meal at first, but then you may proceed to your regular diet.  Drink plenty of fluids but you should avoid  alcoholic beverages for 24 hours. ? ?ACTIVITY:  You should plan to take it easy for the rest of today and you should NOT DRIVE or use heavy machinery until tomorrow (because of the sedation medicines used during the test).   ? ?FOLLOW UP: ?Our staff will call the number listed on your records 48-72 hours following your procedure to check on you and address any questions or concerns that you may have regarding the information given to you following your procedure. If we do not reach you, we will leave a message.  We will attempt to reach you two times.  During this call, we will ask if you have developed any symptoms of COVID 19. If you develop any symptoms (ie: fever, flu-like symptoms, shortness of breath, cough etc.) before then, please call [phone].  If you test positive for Covid 19 in the 2 weeks post procedure, please call and report this information to [place].   ? ?If any biopsies were taken you will be contacted by phone or by letter within the next 1-3 weeks.  Please call us at [phone] if you have not heard about the biopsies in 3 weeks.  ? ? ?SIGNATURES/CONFIDENTIALITY: ?You and/or your care partner have signed paperwork which will be entered into your electronic medical record.  These signatures attest to the fact that that the information above on your After Visit Summary has been reviewed and is understood.  Full responsibility of the  confidentiality of this discharge information lies with you and/or your care-partner.  ?

## 2021-09-19 NOTE — Op Note (Signed)
[facility] ?Patient Name: Crystal Morris ?Procedure Date: 09/19/2021 10:29 AM ?MRN: [id number] ?Endoscopist: Starr LakeHenry L. Myrtie Neitheranis , MD ?Age: [age] ?Referring MD:  ?Date of Birth: 1966/04/15 ?Gender: Female ?Account #: [id number] ?Procedure:                Upper GI endoscopy ?Indications:              Follow-up of duodenal stenosis, Nausea with vomiting ?                          clinical details in 09/16/21 [facility] office note ?                          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                          pylori negative), recent aspirin (exedrin) use for  ?                          headaches ?Medicines:                Monitored Anesthesia Care ?Procedure:                Pre-Anesthesia Assessment: ?                          - Prior to the procedure, a History and Physical  ?                          was performed, and patient medications and  ?                          allergies were reviewed. The patient's tolerance of  ?                          previous anesthesia was also reviewed. The risks  ?                          and benefits of the procedure and the sedation  ?                          options and risks were discussed with the patient.  ?                          All questions were answered, and informed consent  ?                          was obtained. Prior Anticoagulants: The patient has  ?                          taken no previous anticoagulant or antiplatelet  ?                          agents. ASA Grade Assessment: II - A patient with  ?  mild systemic disease. After reviewing the risks  ?                          and benefits, the patient was deemed in  ?                          satisfactory condition to undergo the procedure. ?                          After obtaining informed consent, the endoscope was  ?                          passed under direct vision. Throughout the  ?                          procedure, the patient's blood pressure, pulse, and  ?                           oxygen saturations were monitored continuously. The  ?                          Endoscope was introduced through the mouth, and  ?                          advanced to the third part of duodenum. The upper  ?                          GI endoscopy was accomplished without difficulty.  ?                          The patient tolerated the procedure well. ?Scope In: ?Scope Out: ?Findings:                 The esophagus was normal. ?                          The stomach was normal. No retained food or fluid.  ?                          No dilatation. ?                          The cardia and gastric fundus were normal on  ?                          retroflexion. ?                          The examined duodenum was normal. No retained food  ?                          or fluid and no diltation. ?Complications:            No immediate complications. ?Estimated Blood Loss:     Estimated blood loss: none. ?Impression:               -  Normal esophagus. ?                          - Normal stomach. ?                          - Normal examined duodenum. ?                          - No specimens collected. ?Recommendation:           - Patient has a contact number available for  ?                          emergencies. The signs and symptoms of potential  ?                          delayed complications were discussed with the  ?                          patient. Return to normal activities tomorrow.  ?                          Written discharge instructions were provided to the  ?                          patient. ?                          - Low fiber diet. ?                          - Continue present medications. ?                          - No aspirin, ibuprofen, naproxen, or other  ?                          non-steroidal anti-inflammatory drugs. ?                          - Results will be forwarded to Dr. Chales Abrahams (patient's  ?                          primary GI) for further consideration and  ?                           recommendations on imaging to work up symptoms. ?Clarke Amburn L. Myrtie Neither, MD ?09/19/2021 10:56:18 AM ?This report has been signed electronically. ?

## 2021-09-19 NOTE — Progress Notes (Signed)
Vss nad trans to pacu °

## 2021-09-19 NOTE — Telephone Encounter (Signed)
Pt called asking if Dr Milinda Antis would check her MyChart note for a long detailed message. ?

## 2021-09-20 ENCOUNTER — Inpatient Hospital Stay: Payer: No Typology Code available for payment source | Admitting: Adult Health

## 2021-09-20 NOTE — Telephone Encounter (Signed)
Thanks a lot for your help ?RG ?

## 2021-09-21 ENCOUNTER — Other Ambulatory Visit (HOSPITAL_COMMUNITY): Payer: Self-pay

## 2021-09-21 ENCOUNTER — Other Ambulatory Visit: Payer: Self-pay

## 2021-09-21 ENCOUNTER — Telehealth: Payer: Self-pay

## 2021-09-21 DIAGNOSIS — R112 Nausea with vomiting, unspecified: Secondary | ICD-10-CM

## 2021-09-21 DIAGNOSIS — R109 Unspecified abdominal pain: Secondary | ICD-10-CM

## 2021-09-21 MED ORDER — SUMATRIPTAN SUCCINATE 100 MG PO TABS
ORAL_TABLET | ORAL | 1 refills | Status: DC
  Filled 2021-09-21: qty 9, 30d supply, fill #0

## 2021-09-21 NOTE — Telephone Encounter (Signed)
-----   Message from Jackquline Denmark, MD sent at 09/20/2021  3:13 PM EDT ----- ?Regarding: FW: EGD report ?Proceed with CT Abdo/pelvis with contrast ?She has latest BMP in lab section which is normal ?RG ? ?----- Message ----- ?From: Doran Stabler, MD ?Sent: 09/19/2021  11:54 AM EDT ?To: Jackquline Denmark, MD ?Subject: EGD report                                    ? ?Merrie Roof, ? ?I forwarded today's [place] chart to you on this patient you saw in 2020 with a duodenal stricture.  No stricture found today, has abdominal pain and vomiting, seems she would benefit from CTAP. ?While we did send her home with oral contrast, I had not ordered anything since you will probably want to review Amy's note and the EGD report before you make a decision. ? ?HD ? ? ?

## 2021-09-21 NOTE — Telephone Encounter (Signed)
?  Follow up Call- ? ? ?  09/19/2021  ?  9:41 AM 04/14/2019  ?  8:25 AM  ?Call back number  ?Post procedure Call Back phone  # [phone]  ?Permission to leave phone message Yes Yes  ?  ? ?Patient questions: ? ?Do you have a fever, pain , or abdominal swelling? No. ?Pain Score  0 * ? ?Have you tolerated food without any problems? Yes.   ? ?Have you been able to return to your normal activities? Yes.   ? ?Do you have any questions about your discharge instructions: ?Diet   No. ?Medications  No. ?Follow up visit  No. ? ?Do you have questions or concerns about your Care? No. ? ?Actions: ?* If pain score is 4 or above: ?No action needed, pain <4. ? ? ?

## 2021-09-21 NOTE — Telephone Encounter (Signed)
Inbound call from patients husband, wanting to know if patient can extend her work note until after she has CT scan 4/28. Please advise.  ?

## 2021-09-21 NOTE — Telephone Encounter (Signed)
Pt was scheduled for an CT scan of the abdomen and Pelvis with Contrast on 09/30/2021 at 11:00 AM  at [facility]: ?Pt to arrive at 10:30 AM: Pt made aware ?Nothing to eat or drink 4 hours prior: Pt made aware ?Pt already has Contrast: Pt instructed to drink the first bottle at 9:00 AM and second Bottle at 10:00 AM: ?Pt verbalized understanding with all questions answered  ?

## 2021-09-21 NOTE — Telephone Encounter (Signed)
Please see Notes below and advise: ?

## 2021-09-22 NOTE — Telephone Encounter (Signed)
Given her EGD was negative ?The note may not work ?Certainly can give her a note off work a day prior to CT (as she will be drinking contrast )and the day of CT ?If she agrees, go ahead ?RG ?

## 2021-09-23 NOTE — Telephone Encounter (Signed)
Left message for pt to call back  °

## 2021-09-23 NOTE — Telephone Encounter (Signed)
Spoke with Pt husband Crystal Morris in regard to Dr. Chales Abrahams recommendations:  ?Pt stated that they need the work note to extend for over the weekend due to the fact that the pt does not work during the week and on the weekends: Prior note was written for work excuse for the week of 4/17-4/21: New note was written and sent to pt to extentdto 09/25/2021: Letter sent via my chart: Crystal Morris made aware ?Pt verbalized understanding with all questions answered.  ? ?

## 2021-09-27 ENCOUNTER — Encounter: Payer: Self-pay | Admitting: Family Medicine

## 2021-09-28 ENCOUNTER — Ambulatory Visit (INDEPENDENT_AMBULATORY_CARE_PROVIDER_SITE_OTHER): Payer: No Typology Code available for payment source | Admitting: Family Medicine

## 2021-09-28 ENCOUNTER — Ambulatory Visit (INDEPENDENT_AMBULATORY_CARE_PROVIDER_SITE_OTHER)
Admission: RE | Admit: 2021-09-28 | Discharge: 2021-09-28 | Disposition: A | Discharge status: Home / Self Care | Payer: No Typology Code available for payment source | Source: Ambulatory Visit | Attending: Family Medicine | Admitting: Family Medicine

## 2021-09-28 ENCOUNTER — Encounter: Payer: Self-pay | Admitting: Family Medicine

## 2021-09-28 VITALS — BP 124/78 | HR 68 | Temp 97.4°F | Ht 62.0 in | Wt 138.5 lb

## 2021-09-28 DIAGNOSIS — R8290 Unspecified abnormal findings in urine: Secondary | ICD-10-CM | POA: Diagnosis not present

## 2021-09-28 DIAGNOSIS — M791 Myalgia, unspecified site: Secondary | ICD-10-CM

## 2021-09-28 DIAGNOSIS — M545 Low back pain, unspecified: Secondary | ICD-10-CM

## 2021-09-28 DIAGNOSIS — R112 Nausea with vomiting, unspecified: Secondary | ICD-10-CM | POA: Insufficient documentation

## 2021-09-28 DIAGNOSIS — M2550 Pain in unspecified joint: Secondary | ICD-10-CM | POA: Diagnosis not present

## 2021-09-28 DIAGNOSIS — D508 Other iron deficiency anemias: Secondary | ICD-10-CM

## 2021-09-28 DIAGNOSIS — R109 Unspecified abdominal pain: Secondary | ICD-10-CM | POA: Diagnosis not present

## 2021-09-28 LAB — CBC WITH DIFFERENTIAL/PLATELET
Basophils Absolute: 0 10*3/uL (ref 0.0–0.1)
Basophils Relative: 0.7 % (ref 0.0–3.0)
Eosinophils Absolute: 0 10*3/uL (ref 0.0–0.7)
Eosinophils Relative: 0.6 % (ref 0.0–5.0)
HCT: 40.8 % (ref 36.0–46.0)
Hemoglobin: 13.2 g/dL (ref 12.0–15.0)
Lymphocytes Relative: 36.7 % (ref 12.0–46.0)
Lymphs Abs: 1.8 10*3/uL (ref 0.7–4.0)
MCHC: 32.3 g/dL (ref 30.0–36.0)
MCV: 80.7 fl (ref 78.0–100.0)
Monocytes Absolute: 0.5 10*3/uL (ref 0.1–1.0)
Monocytes Relative: 9.2 % (ref 3.0–12.0)
Neutro Abs: 2.6 10*3/uL (ref 1.4–7.7)
Neutrophils Relative %: 52.8 % (ref 43.0–77.0)
Platelets: 378 10*3/uL (ref 150.0–400.0)
RBC: 5.06 Mil/uL (ref 3.87–5.11)
RDW: 13.2 % (ref 11.5–15.5)
WBC: 4.9 10*3/uL (ref 4.0–10.5)

## 2021-09-28 LAB — BASIC METABOLIC PANEL
BUN: 9 mg/dL (ref 6–23)
CO2: 28 mEq/L (ref 19–32)
Calcium: 9.9 mg/dL (ref 8.4–10.5)
Chloride: 98 mEq/L (ref 96–112)
Creatinine, Ser: 0.98 mg/dL (ref 0.40–1.20)
GFR: 64.38 mL/min (ref >60.00)
Glucose, Bld: 95 mg/dL (ref 70–99)
Potassium: 3.1 mEq/L — ABNORMAL LOW (ref 3.5–5.1)
Sodium: 136 mEq/L (ref 135–145)

## 2021-09-28 LAB — POC URINALSYSI DIPSTICK (AUTOMATED)
Bilirubin, UA: 1
Blood, UA: 25
Glucose, UA: NEGATIVE
Ketones, UA: 5
Leukocytes, UA: NEGATIVE
Nitrite, UA: NEGATIVE
Protein, UA: POSITIVE — AB
Spec Grav, UA: 1.025 (ref 1.010–1.025)
Urobilinogen, UA: 0.2 E.U./dL
pH, UA: 6 (ref 5.0–8.0)

## 2021-09-28 LAB — POCT UA - MICROSCOPIC ONLY
Casts, Ur, LPF, POC: 0
Crystals, Ur, HPF, POC: 0
Yeast, UA: 0

## 2021-09-28 LAB — C-REACTIVE PROTEIN: CRP: <1 mg/dL (ref 0.5–20.0)

## 2021-09-28 LAB — SEDIMENTATION RATE: Sed Rate: 88 mm/hr — ABNORMAL HIGH (ref 0–30)

## 2021-09-28 NOTE — Telephone Encounter (Signed)
Pt has scheduled appt today to discuss concerns with PCP ?

## 2021-09-28 NOTE — Assessment & Plan Note (Addendum)
Pt is convinced this is a kidney problem  ?Severe and bilateral (also diffuse myalgias) ?Reassuring CT renal protocol from ER visit in past  ?Lab pending  ?ua with protein (dip for blood but not impressive on micro) ?She is interested in urology referral  ?

## 2021-09-28 NOTE — Assessment & Plan Note (Signed)
This continues with abd pain  ?Rev GI notes and EGD (no ulcers or bleeding)  ?Poor appetite with wt loss and occ diarrhea with foul odor ?Taking omeprazole 40 mg bid ?Stopped pepcid  ?Has CT abd/pelvis scheduled for 4/28 ?

## 2021-09-28 NOTE — Assessment & Plan Note (Signed)
Protein and blood on dip  ?0-1 rbc on micro  ?Culture pending  ? ?

## 2021-09-28 NOTE — Assessment & Plan Note (Signed)
Diffuse  ?Most around trunk and flank  ? ?Labs pending  ? ?

## 2021-09-28 NOTE — Assessment & Plan Note (Signed)
No bleeding on recent EGD ?Last hb 11.9  ?Cbc ordered  ?

## 2021-09-28 NOTE — Assessment & Plan Note (Signed)
No acute joint changes on exam  ?Auto immune joint labs ordered  ?

## 2021-09-28 NOTE — Progress Notes (Signed)
? ?Subjective:  ? ? Patient ID: Crystal Morris, female    DOB: Jun 26, 1965, 56 y.o.   MRN: [id number] ? ?HPI ?Pt presents for pain and fatigue and GI issues ? ?Wt Readings from Last 3 Encounters:  ?09/28/21 138 lb 8 oz (62.8 kg)  ?09/19/21 145 lb (65.8 kg)  ?09/15/21 145 lb 6 oz (65.9 kg)  ? ?25.33 kg/m? ? ? ?H/o aching all over  ?Worse in R flank ?Now both sides  ? ?Flank areas  ?Sometimes radiates to the low abdomen  ?Sharp pain  ?Constant and worse with movement  ?Makes her restless - keeps changing position trying to get comfortable  ? ?Now has a hard time getting out of bed at all  ?Tried to get out/fresh air and that did not help  ?Very fatigued  ? ?Was supposed to work back on Monday  ?No longer has a work note  ? ? ?BP Readings from Last 3 Encounters:  ?09/28/21 124/78  ?09/19/21 (!) 153/91  ?09/15/21 138/82  ? ?Pulse Readings from Last 3 Encounters:  ?09/28/21 68  ?09/19/21 82  ?09/15/21 91  ? ? ?Chronic headaches  ?Excedrin may have caused GI issues  ? ?Had CT in ER -normal  ?Was in setting of anemia problems  ?Took tramadol ? ?No trauma  ?No falls  ? ? ?GI ?Upper abd pain  ?Nausea with vomiting  ?Fatigue  ?Poor appetite ?Diarrhea mild - foul odor  ? ?Takes omeprazole 40 mg bid  ?Pepcid 20 mg daily - stopped that  ?Zofran for nausea and vomiting  (she still vomits at night occ) ?Just not eating -can't make herself eat due to nausea /also no appetite  ?Yogurt  ?Seeing GI ?Had EGD ?CT scan of abd and pelvis are scheduled for 4/28  ? ?Was taken out of work from 4/17 to 4/23  ? ?Ua is clear ?Results for orders placed or performed in visit on 09/28/21  ?POCT Urinalysis Dipstick (Automated)  ?Result Value Ref Range  ? Color, UA Dark Yellow   ? Clarity, UA Clear   ? Glucose, UA Negative Negative  ? Bilirubin, UA 1 mg/dL   ? Ketones, UA 5 mg/dL   ? Spec Grav, UA 1.025 1.010 - 1.025  ? Blood, UA 25 Ery/uL   ? pH, UA 6.0 5.0 - 8.0  ? Protein, UA Positive (A) Negative  ? Urobilinogen, UA 0.2 0.2 or 1.0 E.U./dL  ?  Nitrite, UA Negative   ? Leukocytes, UA Negative Negative  ?POCT UA - Microscopic Only  ?Result Value Ref Range  ? WBC, Ur, HPF, POC 0-1 0 - 5  ? RBC, Urine, Miroscopic 0-1 0 - 2  ? Bacteria, U Microscopic few None - Trace  ? Mucus, UA few   ? Epithelial cells, urine per micros few   ? Crystals, Ur, HPF, POC 0   ? Casts, Ur, LPF, POC 0   ? Yeast, UA 0   ? ? ?Lab Results  ?Component Value Date  ? WBC 7.0 09/15/2021  ? HGB 11.9 (L) 09/15/2021  ? HCT 38.0 09/15/2021  ? MCV 80.8 09/15/2021  ? PLT 258.0 09/15/2021  ? ?Lab Results  ?Component Value Date  ? CREATININE 1.11 09/19/2021  ? BUN 12 09/19/2021  ? NA 137 09/19/2021  ? K 3.8 09/19/2021  ? CL 96 09/19/2021  ? CO2 28 09/19/2021  ? ?Patient Active Problem List  ? Diagnosis Date Noted  ? Low back pain 09/28/2021  ? Bilateral flank pain 09/28/2021  ?  Multiple joint pain 09/28/2021  ? Abnormal urinalysis 09/28/2021  ? Myalgia 09/12/2021  ? Dyspepsia 09/12/2021  ? DNR (do not resuscitate) discussion 09/12/2021  ? Routine general medical examination at a health care facility 10/22/2020  ? Urinary incontinence 10/22/2020  ? GERD (gastroesophageal reflux disease) 05/22/2019  ? Mood disorder ([place]) 09/09/2018  ? 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 Frequent headaches 10/18/2017  ? Symptoms, such as flushing, sleeplessness, headache, lack of concentration, associated with the menopause 10/18/2017  ? Iron deficiency anemia 09/09/2014  ? Constipation 12/13/2009  ? Vitamin D deficiency 12/13/2009  ? Anemia, unspecified 12/03/2009  ? Allergic rhinitis 12/01/2009  ? Attention or concentration deficit 12/01/2009  ? Migraine headache 12/01/2009  ? ?Past Medical History:  ?Diagnosis Date  ? Anemia   ? GERD (gastroesophageal reflux disease)   ? History of chicken pox   ? Medical history non-contributory   ? Wears glasses   ? ?Past Surgical History:  ?Procedure Laterality Date  ? CESAREAN SECTION    ? CLOSED REDUCTION METACARPAL WITH PERCUTANEOUS PINNING Left 07/28/2014  ? Procedure: CLOSED  REDUCTION METACARPAL WITH PERCUTANEOUS PINNING PROXIMAL PHALANX FRACTURES LEFT MIDDLE RING AND SMALL FINGERS;  Surgeon: Leanora Cover, MD;  Location: [facility];  Service: Orthopedics;  Laterality: Left;  ? COLONOSCOPY    ? 2016  ? HEMORROIDECTOMY    ? INTRAUTERINE DEVICE INSERTION    ? Removed "a long time ago"  ? ?Social History  ? ?Tobacco Use  ? Smoking status: Never  ? Smokeless tobacco: Never  ?Vaping Use  ? Vaping Use: Never used  ?Substance Use Topics  ? Alcohol use: No  ? Drug use: No  ? ?Family History  ?Problem Relation Age of Onset  ? Colon cancer Neg Hx   ? Esophageal cancer Neg Hx   ? Rectal cancer Neg Hx   ? Stomach cancer Neg Hx   ? ?Allergies  ?Allergen Reactions  ? Oxycodone Itching  ?  Patient said that she doesn't have problems with it 05/13/2019  ? ? ? ? ?Review of Systems  ?Constitutional:  Positive for fatigue. Negative for activity change, appetite change, fever and unexpected weight change.  ?HENT:  Negative for congestion, ear pain, rhinorrhea, sinus pressure and sore throat.   ?Eyes:  Negative for pain, redness and visual disturbance.  ?Respiratory:  Negative for cough, shortness of breath and wheezing.   ?Cardiovascular:  Negative for chest pain and palpitations.  ?Gastrointestinal:  Positive for abdominal pain, diarrhea, nausea and vomiting. Negative for anal bleeding, blood in stool, constipation and rectal pain.  ?Endocrine: Negative for polydipsia and polyuria.  ?Genitourinary:  Positive for frequency and urgency. Negative for dysuria.  ?Musculoskeletal:  Positive for arthralgias, back pain and myalgias. Negative for joint swelling.  ?Skin:  Negative for pallor and rash.  ?     No rashes ?No tick bites or other insect bites   ?Allergic/Immunologic: Negative for environmental allergies.  ?Neurological:  Negative for dizziness, syncope and headaches.  ?Hematological:  Negative for adenopathy. Does not bruise/bleed easily.  ?Psychiatric/Behavioral:  Positive for dysphoric  mood. Negative for decreased concentration. The patient is not nervous/anxious.   ? ?   ?Objective:  ? Physical Exam ?Constitutional:   ?   General: She is not in acute distress. ?   Appearance: Normal appearance. She is well-developed and normal weight. She is not ill-appearing or diaphoretic.  ?HENT:  ?   Head: Normocephalic and atraumatic.  ?   Mouth/Throat:  ?   Mouth: Mucous  membranes are moist.  ?   Pharynx: Oropharynx is clear. No posterior oropharyngeal erythema.  ?Eyes:  ?   General: No scleral icterus.    ?   Right eye: No discharge.     ?   Left eye: No discharge.  ?   Conjunctiva/sclera: Conjunctivae normal.  ?   Pupils: Pupils are equal, round, and reactive to light.  ?Neck:  ?   Thyroid: No thyromegaly.  ?   Vascular: No carotid bruit or JVD.  ?Cardiovascular:  ?   Rate and Rhythm: Normal rate and regular rhythm.  ?   Heart sounds: Normal heart sounds.  ?  No gallop.  ?Pulmonary:  ?   Effort: Pulmonary effort is normal. No respiratory distress.  ?   Breath sounds: Normal breath sounds. No stridor. No wheezing, rhonchi or rales.  ?Abdominal:  ?   General: Abdomen is flat. There is no distension or abdominal bruit.  ?   Palpations: Abdomen is soft. There is no hepatomegaly, splenomegaly, mass or pulsatile mass.  ?   Tenderness: There is abdominal tenderness in the right upper quadrant, epigastric area and left upper quadrant. There is right CVA tenderness and left CVA tenderness. There is no guarding or rebound. Negative signs include Murphy's sign.  ?Musculoskeletal:  ?   Cervical back: Normal range of motion and neck supple. No tenderness.  ?   Right lower leg: No edema.  ?   Left lower leg: No edema.  ?   Comments: No kyphosis  ? ?Bilateral flank pain/cva area  ?No rib or TS tenderness ?Mild upper LS tenderness  ?Pain with ext of spine more than flex  ?No crepitus  ?Nl rom of both hips ? ?Stiffness in shoulder joints noted more than hips ?Pain to get up from sitting is significant  ? ?No joint  swelling/enlargement  ? ?No myofasical trigger points   ?Lymphadenopathy:  ?   Cervical: No cervical adenopathy.  ?Skin: ?   General: Skin is warm and dry.  ?   Coloration: Skin is not pale.  ?   Findings: No rash.

## 2021-09-28 NOTE — Assessment & Plan Note (Signed)
With bilat flank pain  ?Worse to extend spine  ?Per pt with any movement  ? ?LS xray ordered  ?

## 2021-09-28 NOTE — Patient Instructions (Signed)
Labs and UA today  ? ?Xray of low back today  ? ?I will place a urology referral ? ?Work note for 2 weeks/until re eval ? ?Get your CT scan and follow up with GI as planned  ?

## 2021-09-29 ENCOUNTER — Ambulatory Visit (HOSPITAL_COMMUNITY)
Admission: RE | Admit: 2021-09-29 | Discharge: 2021-09-29 | Disposition: A | Discharge status: Home / Self Care | Payer: No Typology Code available for payment source | Source: Ambulatory Visit | Attending: Gastroenterology | Admitting: Gastroenterology

## 2021-09-29 DIAGNOSIS — R109 Unspecified abdominal pain: Secondary | ICD-10-CM | POA: Insufficient documentation

## 2021-09-29 DIAGNOSIS — R112 Nausea with vomiting, unspecified: Secondary | ICD-10-CM | POA: Insufficient documentation

## 2021-09-29 LAB — URINE CULTURE
MICRO NUMBER:: 13315191
Result:: NO GROWTH
SPECIMEN QUALITY:: ADEQUATE

## 2021-09-29 MED ORDER — IOHEXOL 300 MG/ML  SOLN
100.0000 mL | Freq: Once | INTRAMUSCULAR | Status: AC | PRN
  Administered 2021-09-29: 100 mL via INTRAVENOUS

## 2021-09-30 ENCOUNTER — Ambulatory Visit (HOSPITAL_COMMUNITY): Payer: No Typology Code available for payment source

## 2021-09-30 ENCOUNTER — Telehealth: Payer: Self-pay | Admitting: Family Medicine

## 2021-09-30 DIAGNOSIS — N3941 Urge incontinence: Secondary | ICD-10-CM

## 2021-09-30 DIAGNOSIS — R109 Unspecified abdominal pain: Secondary | ICD-10-CM

## 2021-09-30 LAB — ANTI-NUCLEAR AB-TITER (ANA TITER): ANA Titer 1: 1:80 {titer} — ABNORMAL HIGH

## 2021-09-30 LAB — ANA: Anti Nuclear Antibody (ANA): POSITIVE — AB

## 2021-09-30 LAB — RHEUMATOID FACTOR: Rheumatoid fact SerPl-aCnc: <14 IU/mL (ref <14)

## 2021-09-30 NOTE — Telephone Encounter (Signed)
Urology referral ?Flank pain  ?Freq urination  ?Nl culture  ?

## 2021-10-02 ENCOUNTER — Encounter: Payer: Self-pay | Admitting: Family Medicine

## 2021-10-02 DIAGNOSIS — M2550 Pain in unspecified joint: Secondary | ICD-10-CM

## 2021-10-02 DIAGNOSIS — R768 Other specified abnormal immunological findings in serum: Secondary | ICD-10-CM

## 2021-10-02 DIAGNOSIS — R700 Elevated erythrocyte sedimentation rate: Secondary | ICD-10-CM

## 2021-10-02 DIAGNOSIS — M791 Myalgia, unspecified site: Secondary | ICD-10-CM

## 2021-10-03 ENCOUNTER — Encounter: Payer: Self-pay | Admitting: Family Medicine

## 2021-10-03 ENCOUNTER — Other Ambulatory Visit (HOSPITAL_COMMUNITY): Payer: Self-pay

## 2021-10-03 DIAGNOSIS — R700 Elevated erythrocyte sedimentation rate: Secondary | ICD-10-CM | POA: Insufficient documentation

## 2021-10-03 DIAGNOSIS — R768 Other specified abnormal immunological findings in serum: Secondary | ICD-10-CM | POA: Insufficient documentation

## 2021-10-03 MED ORDER — POTASSIUM CHLORIDE CRYS ER 20 MEQ PO TBCR
20.0000 meq | EXTENDED_RELEASE_TABLET | Freq: Every day | ORAL | 0 refills | Status: DC
  Filled 2021-10-03: qty 14, 14d supply, fill #0

## 2021-10-03 MED ORDER — PREDNISONE 20 MG PO TABS
ORAL_TABLET | ORAL | 0 refills | Status: DC
  Filled 2021-10-03: qty 15, 10d supply, fill #0

## 2021-10-03 MED ORDER — CYCLOBENZAPRINE HCL 10 MG PO TABS
5.0000 mg | ORAL_TABLET | Freq: Two times a day (BID) | ORAL | 0 refills | Status: DC | PRN
Start: 2021-10-03 — End: 2022-10-31
  Filled 2021-10-03: qty 30, 15d supply, fill #0

## 2021-10-03 MED ORDER — VITAMIN D3 50 MCG (2000 UT) PO CAPS
2000.0000 [IU] | ORAL_CAPSULE | Freq: Every day | ORAL | 3 refills | Status: DC
  Filled 2021-10-03: qty 90, 90d supply, fill #0

## 2021-10-03 NOTE — Telephone Encounter (Signed)
Please call her to set up follow up in approx 2 weeks  ?

## 2021-10-03 NOTE — Telephone Encounter (Signed)
Px prednisone  ?Elevated sed rate and ana  ? ?Declines rheumatology quite yet ?Want to see in 2 wk if poss ?

## 2021-10-04 ENCOUNTER — Other Ambulatory Visit (HOSPITAL_COMMUNITY): Payer: Self-pay

## 2021-10-04 ENCOUNTER — Telehealth: Payer: Self-pay | Admitting: Family Medicine

## 2021-10-04 NOTE — Telephone Encounter (Signed)
Type of forms received: FMLA ? ?Routed to: [employer] inbasket ? ?Paperwork received by : Amy ? ? ?Individual made aware of 3-5 business day turn around (Y/N): Y ? ? ?Form location:  [employer] box ? ?Please call Crystal Morris when ready to pick-up  [phone] ?

## 2021-10-05 ENCOUNTER — Encounter: Payer: Self-pay | Admitting: Family Medicine

## 2021-10-05 NOTE — Telephone Encounter (Signed)
Form in your inbox, please also see mychart message  ?

## 2021-10-06 ENCOUNTER — Telehealth: Payer: Self-pay | Admitting: Family Medicine

## 2021-10-06 NOTE — Telephone Encounter (Signed)
Paperwork and note in IN box ?

## 2021-10-06 NOTE — Telephone Encounter (Signed)
FMLA in IN box ?Work note done  ?

## 2021-10-07 NOTE — Telephone Encounter (Signed)
Pt notified forms ready for pick up and work note also ?

## 2021-10-07 NOTE — Telephone Encounter (Signed)
Pt notified form and letter ready for pick up ?

## 2021-10-18 ENCOUNTER — Other Ambulatory Visit: Payer: Self-pay | Admitting: Family Medicine

## 2021-10-18 ENCOUNTER — Other Ambulatory Visit (HOSPITAL_COMMUNITY): Payer: Self-pay

## 2021-10-18 MED ORDER — PREMPRO 0.3-1.5 MG PO TABS
1.0000 | ORAL_TABLET | Freq: Every day | ORAL | 3 refills | Status: DC
  Filled 2021-10-18: qty 84, 84d supply, fill #0
  Filled 2022-01-03 – 2022-01-31 (×2): qty 84, 84d supply, fill #1
  Filled 2022-04-18: qty 84, 84d supply, fill #2
  Filled 2022-07-20: qty 84, 84d supply, fill #3
  Filled 2022-07-24: qty 28, 28d supply, fill #3
  Filled 2022-07-31: qty 84, 84d supply, fill #3

## 2021-10-18 MED ORDER — POTASSIUM CHLORIDE CRYS ER 20 MEQ PO TBCR
20.0000 meq | EXTENDED_RELEASE_TABLET | Freq: Every day | ORAL | 0 refills | Status: DC
  Filled 2021-10-18: qty 14, 14d supply, fill #0

## 2021-10-18 MED ORDER — ESCITALOPRAM OXALATE 20 MG PO TABS
ORAL_TABLET | ORAL | 3 refills | Status: DC
  Filled 2021-10-18: qty 90, 90d supply, fill #0
  Filled 2022-01-31: qty 90, 90d supply, fill #1
  Filled 2022-05-17: qty 90, 90d supply, fill #2
  Filled 2022-08-14: qty 90, 90d supply, fill #3

## 2021-10-18 NOTE — Telephone Encounter (Signed)
I couldn't refuse Protonix, is okay to refill Lexpro pt has appt on 10/21/21  ?

## 2021-10-19 ENCOUNTER — Other Ambulatory Visit (HOSPITAL_COMMUNITY): Payer: Self-pay

## 2021-10-21 ENCOUNTER — Ambulatory Visit (INDEPENDENT_AMBULATORY_CARE_PROVIDER_SITE_OTHER): Payer: No Typology Code available for payment source | Admitting: Family Medicine

## 2021-10-21 ENCOUNTER — Encounter: Payer: Self-pay | Admitting: Family Medicine

## 2021-10-21 VITALS — BP 132/80 | HR 93 | Temp 98.0°F | Ht 62.0 in | Wt 150.4 lb

## 2021-10-21 DIAGNOSIS — M2550 Pain in unspecified joint: Secondary | ICD-10-CM

## 2021-10-21 DIAGNOSIS — F39 Unspecified mood [affective] disorder: Secondary | ICD-10-CM

## 2021-10-21 DIAGNOSIS — R768 Other specified abnormal immunological findings in serum: Secondary | ICD-10-CM | POA: Diagnosis not present

## 2021-10-21 DIAGNOSIS — M791 Myalgia, unspecified site: Secondary | ICD-10-CM

## 2021-10-21 DIAGNOSIS — R8290 Unspecified abnormal findings in urine: Secondary | ICD-10-CM | POA: Diagnosis not present

## 2021-10-21 DIAGNOSIS — D649 Anemia, unspecified: Secondary | ICD-10-CM

## 2021-10-21 NOTE — Progress Notes (Signed)
Subjective:    Patient ID: Crystal Morris, female    DOB: 04/07/1966, 56 y.o.   MRN: [id number]  HPI Pt presents for f/u of body pain and other complaints   Wt Readings from Last 3 Encounters:  10/21/21 150 lb 6 oz (68.2 kg)  09/28/21 138 lb 8 oz (62.8 kg)  09/19/21 145 lb (65.8 kg)   27.50 kg/m  Last visit labs were done for joint pain and myalgia  Lab Results  Component Value Date   ANA POSITIVE (A) 09/28/2021   RF <14 09/28/2021   Lab Results  Component Value Date   ESRSEDRATE 88 (H) 09/28/2021   Very elevated ESR and ANA was positive Prednisone px   Has had a little improvement  Has gained weight  Earliest appt with rheum is nov   Has looked at webste for lupus Thinks she has a butterflu rash   Dry eyes Hair loss  Pelvic girdle pain  Fatigue   No joint swelling   Interested in [place] she has lupus   [employer] (up and down in the past for more than 7 years)  Sister said there may be some issues in distant family    Has seen GI for n/v   Is able to eat some now/that is improved   Still some bloating and stomach pain  Had normal CT   Taking vitamin D Headaches are still there but not severe   For flank pain/ref to urology at pt req   Iron def enamia Lab Results  Component Value Date   WBC 4.9 09/28/2021   HGB 13.2 09/28/2021   HCT 40.8 09/28/2021   MCV 80.7 09/28/2021   PLT 378.0 09/28/2021   Lab Results  Component Value Date   IRON 115 09/13/2021   TIBC 514 (H) 09/13/2021   FERRITIN 79 09/13/2021   Improved   Patient Active Problem List   Diagnosis Date Noted   Elevated antinuclear antibody (ANA) level 10/03/2021   Elevated sed rate 10/03/2021   Low back pain 09/28/2021   Bilateral flank pain 09/28/2021   Multiple joint pain 09/28/2021   Abnormal urinalysis 09/28/2021   Myalgia 09/12/2021   Dyspepsia 09/12/2021   DNR (do not resuscitate) discussion 09/12/2021   Routine general medical examination at a  health care facility 10/22/2020   Urinary incontinence 10/22/2020   GERD (gastroesophageal reflux disease) 05/22/2019   Mood disorder ([place]) 09/09/2018   Primary insomnia 10/18/2017   Frequent headaches 10/18/2017   Symptoms, such as flushing, sleeplessness, headache, lack of concentration, associated with the menopause 10/18/2017   Iron deficiency anemia 09/09/2014   Constipation 12/13/2009   Vitamin D deficiency 12/13/2009   Anemia, unspecified 12/03/2009   Allergic rhinitis 12/01/2009   Attention or concentration deficit 12/01/2009   Migraine headache 12/01/2009   Past Medical History:  Diagnosis Date   Anemia    GERD (gastroesophageal reflux disease)    History of chicken pox    Medical history non-contributory    Wears glasses    Past Surgical History:  Procedure Laterality Date   CESAREAN SECTION     CLOSED REDUCTION METACARPAL WITH PERCUTANEOUS PINNING Left 07/28/2014   Procedure: CLOSED REDUCTION METACARPAL WITH PERCUTANEOUS PINNING PROXIMAL PHALANX FRACTURES LEFT MIDDLE RING AND SMALL FINGERS;  Surgeon: Leanora Cover, MD;  Location: [facility];  Service: Orthopedics;  Laterality: Left;   COLONOSCOPY     2016   HEMORROIDECTOMY     INTRAUTERINE DEVICE INSERTION  Removed "a long time ago"   Social History   Tobacco Use   Smoking status: Never   Smokeless tobacco: Never  Vaping Use   Vaping Use: Never used  Substance Use Topics   Alcohol use: No   Drug use: No   Family History  Problem Relation Age of Onset   Colon cancer Neg Hx    Esophageal cancer Neg Hx    Rectal cancer Neg Hx    Stomach cancer Neg Hx    Allergies  Allergen Reactions   Oxycodone Itching    Patient said that she doesn't have problems with it 05/13/2019     Review of Systems  Constitutional:  Positive for fatigue. Negative for activity change, appetite change, fever and unexpected weight change.  HENT:  Negative for congestion, ear pain, rhinorrhea, sinus pressure and  sore throat.   Eyes:  Negative for pain, redness and visual disturbance.  Respiratory:  Negative for cough, shortness of breath and wheezing.   Cardiovascular:  Negative for chest pain and palpitations.  Gastrointestinal:  Negative for abdominal pain, blood in stool, constipation and diarrhea.  Endocrine: Negative for polydipsia and polyuria.  Genitourinary:  Negative for dysuria, frequency and urgency.  Musculoskeletal:  Positive for arthralgias, back pain and myalgias. Negative for joint swelling.  Skin:  Positive for rash. Negative for pallor.       Face/cheefs on and off   Allergic/Immunologic: Negative for environmental allergies.  Neurological:  Negative for dizziness, syncope and headaches.  Hematological:  Negative for adenopathy. Does not bruise/bleed easily.  Psychiatric/Behavioral:  Negative for decreased concentration and dysphoric mood. The patient is not nervous/anxious.       Objective:   Physical Exam Constitutional:      General: She is not in acute distress.    Appearance: Normal appearance. She is well-developed and normal weight. She is not ill-appearing or diaphoretic.  HENT:     Head: Normocephalic and atraumatic.  Eyes:     Conjunctiva/sclera: Conjunctivae normal.     Pupils: Pupils are equal, round, and reactive to light.  Neck:     Thyroid: No thyromegaly.     Vascular: No carotid bruit or JVD.  Cardiovascular:     Rate and Rhythm: Normal rate and regular rhythm.     Heart sounds: Normal heart sounds.    No gallop.  Pulmonary:     Effort: Pulmonary effort is normal. No respiratory distress.     Breath sounds: Normal breath sounds. No stridor. No wheezing, rhonchi or rales.  Abdominal:     General: There is no distension or abdominal bruit.     Palpations: Abdomen is soft.  Musculoskeletal:     Cervical back: Normal range of motion and neck supple.     Right lower leg: No edema.     Left lower leg: No edema.     Comments: No acute joint  changes  Some pain with palp of wrists/knees  Some flank tenderness  Lymphadenopathy:     Cervical: No cervical adenopathy.  Skin:    General: Skin is warm and dry.     Coloration: Skin is not pale.     Findings: No rash.     Comments: No obvious facial rash today   Neurological:     Mental Status: She is alert.     Coordination: Coordination normal.     Deep Tendon Reflexes: Reflexes are normal and symmetric. Reflexes normal.  Psychiatric:        Mood and Affect:  Mood normal.     Comments: Pt expresses distress over wt gain with prednisone and pain /exhaustion    Candidly discusses symptoms and stressors            Assessment & Plan:   Problem List Items Addressed This Visit       Other   Abnormal urinalysis    No growth on urine culture         Anemia, unspecified    No anemia last check       Elevated antinuclear antibody (ANA) level - Primary    Pos ANA Neg RF Elevated ESR Some mild improvement with prednisone  Pt is frustrated however with arthralgia/myalgia and fatigue  Thinks she has had a butterfly rash in the past  Thinks she has had lupus symptoms for a while  Earliest appt with rheumatology is November -she is interested in other areas if anything available  Wants to try to work full days for fmla for 2 d per week Will let us know if symptoms worsen now done with prednisone        Mood disorder ([place])    Mood is worse and more depressed with current rheum symptoms   Takes lexapro   snri may be more helpful in light of symptoms/pain Given info on cymbalta to consider  inst to alert [place] if she wants to change        Multiple joint pain    In setting of pos ana and elevated ESR       Myalgia

## 2021-10-21 NOTE — Patient Instructions (Signed)
Stay on lexapro   Take care of yourself  Try and go back to work as tolerated, have your company send me another FMLA form to give you intermittent leave as needed up to 2 days per week   Drink lots of fluids  Be as active as you can and rest when needed   I will look into other options for rheumatology   Look at the info on generic cymbalta- let me know if you want to change from lexapro to that in the future

## 2021-10-23 NOTE — Assessment & Plan Note (Signed)
Pos ANA Neg RF Elevated ESR Some mild improvement with prednisone  Pt is frustrated however with arthralgia/myalgia and fatigue  Thinks she has had a butterfly rash in the past  Thinks she has had lupus symptoms for a while  Earliest appt with rheumatology is November -she is interested in other areas if anything available  Wants to try to work full days for fmla for 2 d per week Will let us know if symptoms worsen now done with prednisone

## 2021-10-23 NOTE — Assessment & Plan Note (Signed)
No anemia last check

## 2021-10-23 NOTE — Assessment & Plan Note (Signed)
No growth on urine culture.

## 2021-10-23 NOTE — Assessment & Plan Note (Signed)
In setting of pos ana and elevated ESR

## 2021-10-23 NOTE — Assessment & Plan Note (Signed)
Mood is worse and more depressed with current rheum symptoms   Takes lexapro   snri may be more helpful in light of symptoms/pain Given info on cymbalta to consider  inst to alert [place] if she wants to change

## 2021-10-24 ENCOUNTER — Telehealth: Payer: Self-pay

## 2021-10-24 NOTE — Telephone Encounter (Signed)
Crystal Morris called stating the note that was given to the patient on her last visit for work was too vague for her work and they need more clear note like how many hours patient can work and any other restrictions. Patient was suppose to go back today but did not because they would not accept her last note. Patient has not been at work so can not say what type of schedule she needs. Please review

## 2021-10-24 NOTE — Telephone Encounter (Signed)
She wanted to work full time with FMLA stating that she may miss up to 2 days weekly due to her symptoms.   I will do the FMLA when I get it   That means technically her schedule is normal w/o restrictions (she told me she did not want shorter days) Is that what she wants with the note ?

## 2021-10-25 ENCOUNTER — Telehealth: Payer: Self-pay | Admitting: Family Medicine

## 2021-10-25 DIAGNOSIS — Z0279 Encounter for issue of other medical certificate: Secondary | ICD-10-CM

## 2021-10-25 NOTE — Telephone Encounter (Signed)
Pt FLMA Paperwork was dropped off to be filled out by PCP and it was placed in PCP box.

## 2021-10-25 NOTE — Telephone Encounter (Signed)
FMLA is in in box  We agreed to start with up to 2 d out of work weekly as tolerated  She wanted to return full time   Letter should be visible in [employer]

## 2021-10-25 NOTE — Telephone Encounter (Signed)
Duplicate note, see other phone note

## 2021-10-25 NOTE — Telephone Encounter (Signed)
Spoke to [place], he is going to bring FML paperwork for Noelle, but is also requesting a note, states they don't know how many days she will need

## 2021-10-25 NOTE — Telephone Encounter (Signed)
FMLA forms in your inbox, please see Amy's prev message

## 2021-10-26 ENCOUNTER — Encounter: Payer: Self-pay | Admitting: Family Medicine

## 2021-10-26 NOTE — Telephone Encounter (Signed)
No fax # on forms so pt will pick up, copy sent to scanning and copy of letter also placed with forms

## 2021-11-16 ENCOUNTER — Other Ambulatory Visit: Payer: Self-pay | Admitting: Family

## 2021-11-16 ENCOUNTER — Other Ambulatory Visit (HOSPITAL_COMMUNITY): Payer: Self-pay

## 2021-11-22 ENCOUNTER — Other Ambulatory Visit (HOSPITAL_COMMUNITY): Payer: Self-pay

## 2021-11-22 ENCOUNTER — Other Ambulatory Visit: Payer: Self-pay | Admitting: Family

## 2021-11-22 MED ORDER — ZOLPIDEM TARTRATE ER 12.5 MG PO TBCR
12.5000 mg | EXTENDED_RELEASE_TABLET | Freq: Every day | ORAL | 1 refills | Status: DC
  Filled 2021-11-22: qty 90, 90d supply, fill #0
  Filled 2022-03-13: qty 30, 30d supply, fill #1
  Filled 2022-04-18: qty 30, 30d supply, fill #2
  Filled 2022-05-17: qty 30, 30d supply, fill #3

## 2021-11-22 NOTE — Telephone Encounter (Signed)
Last office visit 10/21/21 for elevated ANA.  Last refilled 05/10/21 for #30 with 5 refills.  No future appointments with PCP.  Patient is asking for 90 day supply.

## 2021-12-16 NOTE — Telephone Encounter (Signed)
error 

## 2022-01-03 ENCOUNTER — Other Ambulatory Visit (HOSPITAL_COMMUNITY): Payer: Self-pay

## 2022-01-13 ENCOUNTER — Other Ambulatory Visit (HOSPITAL_COMMUNITY): Payer: Self-pay

## 2022-01-31 ENCOUNTER — Other Ambulatory Visit (HOSPITAL_COMMUNITY): Payer: Self-pay

## 2022-02-08 ENCOUNTER — Other Ambulatory Visit (HOSPITAL_COMMUNITY): Payer: Self-pay

## 2022-03-13 ENCOUNTER — Other Ambulatory Visit (HOSPITAL_COMMUNITY): Payer: Self-pay

## 2022-03-15 ENCOUNTER — Other Ambulatory Visit (HOSPITAL_COMMUNITY): Payer: Self-pay

## 2022-03-16 ENCOUNTER — Encounter (HOSPITAL_COMMUNITY): Payer: Self-pay

## 2022-03-16 ENCOUNTER — Other Ambulatory Visit: Payer: Self-pay

## 2022-03-16 ENCOUNTER — Emergency Department (HOSPITAL_COMMUNITY)
Admission: EM | Admit: 2022-03-16 | Discharge: 2022-03-16 | Disposition: A | Discharge status: Home / Self Care | Payer: No Typology Code available for payment source | Attending: Emergency Medicine | Admitting: Emergency Medicine

## 2022-03-16 DIAGNOSIS — Z20822 Contact with and (suspected) exposure to covid-19: Secondary | ICD-10-CM | POA: Diagnosis not present

## 2022-03-16 DIAGNOSIS — R531 Weakness: Secondary | ICD-10-CM | POA: Diagnosis not present

## 2022-03-16 DIAGNOSIS — R5383 Other fatigue: Secondary | ICD-10-CM | POA: Insufficient documentation

## 2022-03-16 DIAGNOSIS — R112 Nausea with vomiting, unspecified: Secondary | ICD-10-CM | POA: Insufficient documentation

## 2022-03-16 LAB — CBC WITH DIFFERENTIAL/PLATELET
Abs Immature Granulocytes: 0.01 10*3/uL (ref 0.00–0.07)
Basophils Absolute: 0 10*3/uL (ref 0.0–0.1)
Basophils Relative: 1 %
Eosinophils Absolute: 0 10*3/uL (ref 0.0–0.5)
Eosinophils Relative: 1 %
HCT: 45 % (ref 36.0–46.0)
Hemoglobin: 14 g/dL (ref 12.0–15.0)
Immature Granulocytes: 0 %
Lymphocytes Relative: 44 %
Lymphs Abs: 2.5 10*3/uL (ref 0.7–4.0)
MCH: 25 pg — ABNORMAL LOW (ref 26.0–34.0)
MCHC: 31.1 g/dL (ref 30.0–36.0)
MCV: 80.4 fL (ref 80.0–100.0)
Monocytes Absolute: 0.7 10*3/uL (ref 0.1–1.0)
Monocytes Relative: 12 %
Neutro Abs: 2.4 10*3/uL (ref 1.7–7.7)
Neutrophils Relative %: 42 %
Platelets: 319 10*3/uL (ref 150–400)
RBC: 5.6 MIL/uL — ABNORMAL HIGH (ref 3.87–5.11)
RDW: 13.7 % (ref 11.5–15.5)
WBC: 5.6 10*3/uL (ref 4.0–10.5)
nRBC: 0 % (ref 0.0–0.2)

## 2022-03-16 LAB — URINALYSIS, ROUTINE W REFLEX MICROSCOPIC
Bacteria, UA: NONE SEEN
Bilirubin Urine: NEGATIVE
Glucose, UA: NEGATIVE mg/dL
Ketones, ur: 5 mg/dL — AB
Leukocytes,Ua: NEGATIVE
Nitrite: NEGATIVE
Protein, ur: 30 mg/dL — AB
Specific Gravity, Urine: 1.025 (ref 1.005–1.030)
pH: 5 (ref 5.0–8.0)

## 2022-03-16 LAB — COMPREHENSIVE METABOLIC PANEL
ALT: 17 U/L (ref 0–44)
AST: 26 U/L (ref 15–41)
Albumin: 4.1 g/dL (ref 3.5–5.0)
Alkaline Phosphatase: 42 U/L (ref 38–126)
Anion gap: 11 (ref 5–15)
BUN: 17 mg/dL (ref 6–20)
CO2: 25 mmol/L (ref 22–32)
Calcium: 9.1 mg/dL (ref 8.9–10.3)
Chloride: 99 mmol/L (ref 98–111)
Creatinine, Ser: 0.91 mg/dL (ref 0.44–1.00)
GFR, Estimated: >60 mL/min (ref >60)
Glucose, Bld: 93 mg/dL (ref 70–99)
Potassium: 3.2 mmol/L — ABNORMAL LOW (ref 3.5–5.1)
Sodium: 135 mmol/L (ref 135–145)
Total Bilirubin: 0.8 mg/dL (ref 0.3–1.2)
Total Protein: 8 g/dL (ref 6.5–8.1)

## 2022-03-16 LAB — LIPASE, BLOOD: Lipase: 36 U/L (ref 11–51)

## 2022-03-16 LAB — RESP PANEL BY RT-PCR (FLU A&B, COVID) ARPGX2
Influenza A by PCR: NEGATIVE
Influenza B by PCR: NEGATIVE
SARS Coronavirus 2 by RT PCR: NEGATIVE

## 2022-03-16 MED ORDER — ONDANSETRON 4 MG PO TBDP
4.0000 mg | ORAL_TABLET | Freq: Once | ORAL | Status: AC
  Administered 2022-03-16: 4 mg via ORAL
  Filled 2022-03-16: qty 1

## 2022-03-16 MED ORDER — POTASSIUM CHLORIDE 20 MEQ PO PACK
40.0000 meq | PACK | Freq: Two times a day (BID) | ORAL | Status: DC
  Administered 2022-03-16: 40 meq via ORAL
  Filled 2022-03-16: qty 2

## 2022-03-16 MED ORDER — ONDANSETRON 4 MG PO TBDP
4.0000 mg | ORAL_TABLET | Freq: Three times a day (TID) | ORAL | 0 refills | Status: DC | PRN
  Filled 2022-03-16: qty 20, 7d supply, fill #0

## 2022-03-16 MED ORDER — LACTATED RINGERS IV BOLUS
1000.0000 mL | Freq: Once | INTRAVENOUS | Status: AC
  Administered 2022-03-16: 1000 mL via INTRAVENOUS

## 2022-03-16 MED ORDER — ONDANSETRON HCL 4 MG/2ML IJ SOLN
4.0000 mg | Freq: Once | INTRAMUSCULAR | Status: DC
  Filled 2022-03-16: qty 2

## 2022-03-16 MED ORDER — ONDANSETRON HCL 4 MG/2ML IJ SOLN
4.0000 mg | Freq: Once | INTRAMUSCULAR | Status: AC
  Administered 2022-03-16: 4 mg via INTRAVENOUS
  Filled 2022-03-16: qty 2

## 2022-03-16 NOTE — Discharge Instructions (Addendum)
Thank you for coming to [facility]. You were seen for nausea and vomiting. We did an exam, labs, and imaging, and these showed a mildly low potassium, which was repleted here in the emergency department.   Please follow up with your primary care provider within 1 week.   Do not hesitate to return to the ED or call 911 if you experience: -Worsening symptoms -Inability to eat or drink at home -Lightheadedness, passing out -Fevers/chills -Anything else that concerns you

## 2022-03-16 NOTE — ED Provider Triage Note (Signed)
Emergency Medicine Provider Triage Evaluation Note  Crystal Morris , a 56 y.o. female  was evaluated in triage.  Pt complains of nausea, vomiting, feeling dizzy (room spinning and near syncopal)/weak since Saturday. Went to a cookout that day before symptoms started. Denies bloody emesis, changes in bowel or bladder habits, fevers, chills, sick contacts.   Review of Systems  Positive: As above Negative: As above  Physical Exam  BP (!) 138/104 (BP Location: Right Arm)   Pulse (!) 104   Temp 98.4 F (36.9 C) (Oral)   Resp 18   Ht 5\' 2"  (1.575 m)   Wt 63.5 kg   SpO2 99%   BMI 25.61 kg/m  Gen:   Awake, no distress   Resp:  Normal effort  MSK:   Moves extremities without difficulty  Other:    Medical Decision Making  Medically screening exam initiated at 4:41 PM.  Appropriate orders placed.  Karolyn G Pastorino was informed that the remainder of the evaluation will be completed by another provider, this initial triage assessment does not replace that evaluation, and the importance of remaining in the ED until their evaluation is complete.     Tacy Learn, PA-C 03/16/22 1641

## 2022-03-16 NOTE — ED Provider Notes (Signed)
[facility]-EMERGENCY DEPT Provider Note   CSN: [id number] Arrival date & time: 03/16/22  1628     History  Chief Complaint  Patient presents with   Emesis   Weakness   Fatigue    Tira CATALEIA GADE is a 56 y.o. female with migraine, GERD, low back pain, IDA, frequent headaches, mood disorder, GERD presents with generalized weakness, fatigue, emesis.   Patient reports that since Friday, October 6 she has had nausea and vomiting associated with fatigue.  Denies any abdominal pain, fever/chills, chest pain, shortness of breath, cough, flulike symptoms, diarrhea/constipation, hematochezia/melena, urinary symptoms, vaginal symptoms.  Patient states that she at times has had lightheadedness with standing but has not fainted or lost consciousness or fallen.  She has missed several days of work due to the symptoms.  No history of similar.  No sick contacts. Patient reports that her last bowel movement was on Saturday but that she has not really eaten anything, she is still passing gas.   Emesis Weakness Associated symptoms: vomiting        Home Medications Prior to Admission medications   Medication Sig Start Date End Date Taking? Authorizing Provider  ondansetron (ZOFRAN-ODT) 4 MG disintegrating tablet Take 1 tablet (4 mg total) by mouth every 8 (eight) hours as needed for nausea or vomiting. 03/16/22  Yes Loetta Rough, MD  Cholecalciferol (VITAMIN D3) 50 MCG (2000 UT) capsule Take 1 capsule (2,000 Units total) by mouth daily. 10/03/21   Tower, Audrie Gallus, MD  cyclobenzaprine (FLEXERIL) 10 MG tablet Take 1/2 to 1 tablet (5-10 mg total) by mouth 2 (two) times daily as needed for muscle spasms. Caution of sedation 10/03/21   Tower, Audrie Gallus, MD  diphenhydrAMINE (BENADRYL) 25 mg capsule Take 25 mg by mouth daily as needed for itching, allergies or sleep.    [provider]  escitalopram (LEXAPRO) 20 MG tablet TAKE 1 TABLET BY MOUTH ONCE A DAY **MUST SCHEDULE PHYSICAL  EXAM 10/18/21 10/18/22  Tower, Audrie Gallus, MD  Ketotifen Fumarate (ZADITOR OP) Apply 1 drop to eye 2 (two) times daily as needed.    [provider]  omeprazole (PRILOSEC) 40 MG capsule Take 1 capsule by mouth 2 times daily. 09/15/21   [place], Amy S, PA-C  polyethylene glycol powder (GLYCOLAX/MIRALAX) 17 GM/SCOOP powder Take 0.5 Containers by mouth daily as needed for moderate constipation or mild constipation.    [provider]  potassium chloride SA (KLOR-CON M) 20 MEQ tablet Take 1 tablet by mouth daily. 10/18/21   Tower, Audrie Gallus, MD  PREMPRO 0.3-1.5 MG tablet TAKE 1 TABLET BY MOUTH DAILY. 10/18/21 10/18/22  Tower, Audrie Gallus, MD  SUMAtriptan (IMITREX) 100 MG tablet Take one tablet for migraine once daily as needed, no more than 3 days a week 09/21/21   Tower, Audrie Gallus, MD  zolpidem (AMBIEN CR) 12.5 MG CR tablet Take 1 tablet (12.5 mg total) by mouth at bedtime. 11/22/21   Tower, Audrie Gallus, MD      Allergies    Oxycodone    Review of Systems   Review of Systems  Gastrointestinal:  Positive for vomiting.  Neurological:  Positive for weakness.   Review of systems negative for fever/chills.  A 10 point review of systems was performed and is negative unless otherwise reported in HPI.  Physical Exam Updated Vital Signs BP (!) 150/86   Pulse 78   Temp 97.8 F (36.6 C) (Oral)   Resp 17   Ht 5\' 2"  (1.575 m)  Wt 63.5 kg   SpO2 100%   BMI 25.61 kg/m  Physical Exam General: Normal appearing female, lying in bed.  HEENT: PERRLA, Sclera anicteric, MMM, trachea midline. Cardiology: RRR, no murmurs/rubs/gallops.  Resp: Normal respiratory rate and effort. CTAB, no wheezes, rhonchi, crackles.  Abd: Soft, non-tender, non-distended. No rebound tenderness or guarding.  GU: Deferred. MSK: No peripheral edema or signs of trauma. Extremities without deformity or TTP. No cyanosis or clubbing. Skin: warm, dry. No rashes or lesions. Back: No CVA tenderness Neuro: A&Ox4, CNs II-XII grossly  intact. MAEs. Sensation grossly intact.  Psych: Normal mood and affect.   ED Results / Procedures / Treatments   Labs (all labs ordered are listed, but only abnormal results are displayed) Labs Reviewed  CBC WITH DIFFERENTIAL/PLATELET - Abnormal; Notable for the following components:      Result Value   RBC 5.60 (*)    MCH 25.0 (*)    All other components within normal limits  COMPREHENSIVE METABOLIC PANEL - Abnormal; Notable for the following components:   Potassium 3.2 (*)    All other components within normal limits  URINALYSIS, ROUTINE W REFLEX MICROSCOPIC - Abnormal; Notable for the following components:   APPearance HAZY (*)    Hgb urine dipstick SMALL (*)    Ketones, ur 5 (*)    Protein, ur 30 (*)    All other components within normal limits  RESP PANEL BY RT-PCR (FLU A&B, COVID) ARPGX2  LIPASE, BLOOD    EKG EKG Interpretation  Date/Time:  Thursday March 16 2022 22:22:37 EDT Ventricular Rate:  77 PR Interval:  178 QRS Duration: 66 QT Interval:  390 QTC Calculation: 441 R Axis:   63 Text Interpretation: Normal sinus rhythm Normal ECG When compared with ECG of 13-Sep-2021 22:29, PREVIOUS ECG IS PRESENT Confirmed by Vivi Barrack [phone]) on 03/16/2022 10:26:22 PM  Radiology No results found.  Procedures Procedures    Medications Ordered in ED Medications  potassium chloride (KLOR-CON) packet 40 mEq (40 mEq Oral Given 03/16/22 2109)  ondansetron (ZOFRAN) injection 4 mg (4 mg Intravenous Not Given 03/16/22 2207)  ondansetron [facility]) injection 4 mg (4 mg Intravenous Given 03/16/22 1819)  lactated ringers bolus 1,000 mL (0 mLs Intravenous Stopped 03/16/22 2110)  ondansetron (ZOFRAN-ODT) disintegrating tablet 4 mg (4 mg Oral Given 03/16/22 2207)    ED Course/ Medical Decision Making/ A&P                          Medical Decision Making Amount and/or Complexity of Data Reviewed Labs:  Decision-making details documented in ED Course.  Risk Prescription  drug management.   Patient is overall well-appearing and hemodynamically stable, afebrile, presenting with several days of nausea vomiting and fatigue.  For this patient's nausea/vomiting/fatigue, consider:  No abdominal pain and very benign abdominal exam, no concern for acute surgical abdomen at this time including appendicitis, diverticulitis, cholecystitis/cholelithiasis.  Patient reports last bowel movement several days ago but that she is also not eat anything, she has no abdominal tenderness to palpation with no distention and is able to drink liquid, low concern for SBO at this time.  Consider atypical ACS/arrhythmia given nausea and lightheadedness, however patient has no chest pain; EKG with normal sinus rhythm, normal axis, normal intervals, without any evidence of ischemia.  Consider viral illness and will test for flu/COVID.  No flank pain to suggest pyelonephritis or nephrolithiasis.  No urinary symptoms to suggest UTI but will obtain UA to evaluate.  We will assess for anemia, Electra abnormalities, renal injury/AKI, dehydration.  Patient states that she is able to orally hydrate at home but has not been able to eat anything due to nausea.  I have personally reviewed and interpreted all labs and imaging.   Clinical Course as of 03/16/22 2210  Thu Mar 16, 2022  1749 Potassium(!): 3.2 Repleting [HN]  1749 Creatinine: 0.91 [HN]  1749 BUN: 17 [HN]  1749 Lipase: 36 [HN]  1749 WBC: 5.6 [HN]  1750 Urine without infection, no leukocytes/nitrites. Mild ketonuria and proteinuria.  [HN]  2018 Orthostatic vitals negative [HN]    Clinical Course User Index [HN] Audley Hose, MD   Patient reports that she feels okay and not lightheaded standing for the orthostatic vitals, which were negative.  Will discharge patient with Zofran ODT prescription and instructions to follow-up with her primary care physician within the week.  Encouraged to stay well-hydrated and given discharge instructions  and return precautions, including but not limited to worsening symptoms, symptoms that are not improving, inability to eat or drink, fevers chills, abdominal pain.  Patient and her husband report understanding.  All questions answered to their satisfaction.  Dispo: DC         Final Clinical Impression(s) / ED Diagnoses Final diagnoses:  Nausea and vomiting, unspecified vomiting type  Fatigue, unspecified type    Rx / DC Orders ED Discharge Orders          Ordered    ondansetron (ZOFRAN-ODT) 4 MG disintegrating tablet  Every 8 hours PRN        03/16/22 2159             This note was created using dictation software, which may contain spelling or grammatical errors.    Audley Hose, MD 03/16/22 2231

## 2022-03-16 NOTE — ED Triage Notes (Signed)
Patient c/o N/v, feeling lethargic, and weakness x 5 days.

## 2022-03-17 ENCOUNTER — Other Ambulatory Visit (HOSPITAL_COMMUNITY): Payer: Self-pay

## 2022-03-24 NOTE — Progress Notes (Addendum)
Office Visit Note  Patient: Crystal Morris             Date of Birth: 09-17-1965           MRN: [id number]             PCP: Abner Greenspan, MD Referring: Tower, Wynelle Fanny, MD Visit Date: 04/06/2022 Occupation: _0 @  Subjective:  Pain in multiple joints and muscles  History of Present Illness: Crystal Morris is a 56 y.o. female seen in consultation per request of her PCP.  According the patient her symptoms a started in January 2023 with nausea vomiting, fatigue, myalgia and arthralgias.  She states she was seen in the emergency room x3 over the 3 months.  She states these episodes will last for a week and then resolved.  She was given IV fluids and also iron infusion for anemia.  She states she also started having lower back pain to the point that she was having difficulty walking.  Gradually the lower back pain improved.  She states that she was having pain in all of her joints, her joints were stiff and she had difficulty moving.  She was seen by her PCP in April when she had lab work.  Her sedimentation rate was elevated.  She was given prednisone taper which was over 3 weeks but she did not notice any improvement on prednisone.  She states that her symptoms come and go.  During these flares she continues to have fatigue lack of appetite, myalgias, arthralgias, headaches and occasional rash.  She states that the episodes last for 1 week and then the resolve by themselves.  Last flare was about 3 weeks ago.  She takes over-the-counter Tylenol for the episodes.  She gives history of fatigue, sores in her mouth, dry mouth, dry eyes, there is no history of malar rash, photosensitivity, Raynaud's phenomenon or lymphadenopathy.  Parent's father lives in [place] and according to the patient the doctors in [place] diagnosed him with rheumatoid arthritis.  She is gravida 4, para 3, ectopic pregnancy 1.  Activities of Daily Living:  Patient reports morning stiffness for 1 hour.   Patient Denies  nocturnal pain.  Difficulty dressing/grooming: Reports Difficulty climbing stairs: Reports Difficulty getting out of chair: Reports Difficulty using hands for taps, buttons, cutlery, and/or writing: Denies  Review of Systems  Constitutional:  Positive for fatigue.  HENT:  Positive for mouth sores and mouth dryness.   Eyes:  Positive for dryness.  Respiratory:  Negative for difficulty breathing.   Cardiovascular:  Positive for palpitations. Negative for chest pain.       EKG normal per patient.  Gastrointestinal:  Positive for constipation. Negative for blood in stool and diarrhea.  Endocrine: Positive for increased urination.  Genitourinary:  Negative for involuntary urination.  Musculoskeletal:  Positive for joint pain, joint pain, myalgias, muscle weakness, morning stiffness, muscle tenderness and myalgias. Negative for gait problem and joint swelling.  Skin:  Positive for hair loss. Negative for color change, rash and sensitivity to sunlight.  Allergic/Immunologic: Negative for susceptible to infections.  Neurological:  Positive for headaches. Negative for dizziness.  Hematological:  Negative for swollen glands.  Psychiatric/Behavioral:  Positive for depressed mood. Negative for sleep disturbance. The patient is nervous/anxious.     PMFS History:  Patient Active Problem List   Diagnosis Date Noted   Elevated antinuclear antibody (ANA) level 10/03/2021   Elevated sed rate 10/03/2021   Low back pain 09/28/2021   Bilateral flank pain  09/28/2021   Multiple joint pain 09/28/2021   Abnormal urinalysis 09/28/2021   Myalgia 09/12/2021   Dyspepsia 09/12/2021   DNR (do not resuscitate) discussion 09/12/2021   Routine general medical examination at a health care facility 10/22/2020   Urinary incontinence 10/22/2020   GERD (gastroesophageal reflux disease) 05/22/2019   Mood disorder ([place]) 09/09/2018   Primary insomnia 10/18/2017   Frequent headaches 10/18/2017   Symptoms, such as  flushing, sleeplessness, headache, lack of concentration, associated with the menopause 10/18/2017   Iron deficiency anemia 09/09/2014   Constipation 12/13/2009   Vitamin D deficiency 12/13/2009   Anemia, unspecified 12/03/2009   Allergic rhinitis 12/01/2009   Attention or concentration deficit 12/01/2009   Migraine headache 12/01/2009    Past Medical History:  Diagnosis Date   Anemia    GERD (gastroesophageal reflux disease)    History of chicken pox    Medical history non-contributory    Wears glasses     Family History  Problem Relation Age of Onset   Gout Mother    Rheum arthritis Mother    Rheum arthritis Father    Gout Father    Colon cancer Neg Hx    Esophageal cancer Neg Hx    Rectal cancer Neg Hx    Stomach cancer Neg Hx    Past Surgical History:  Procedure Laterality Date   CESAREAN SECTION     CLOSED REDUCTION METACARPAL WITH PERCUTANEOUS PINNING Left 07/28/2014   Procedure: CLOSED REDUCTION METACARPAL WITH PERCUTANEOUS PINNING PROXIMAL PHALANX FRACTURES LEFT MIDDLE [place] AND SMALL FINGERS;  Surgeon: Leanora Cover, MD;  Location: [facility];  Service: Orthopedics;  Laterality: Left;   COLONOSCOPY     2016   HEMORROIDECTOMY     INTRAUTERINE DEVICE INSERTION     Removed "a long time ago"   Social History   Social History Narrative   Not on file   Immunization History  Administered Date(s) Administered   Influenza,inj,Quad PF,6+ Mos 04/03/2018, 05/22/2019, 04/20/2020   PFIZER Comirnaty(Gray Top)Covid-19 Tri-Sucrose Vaccine 07/08/2019, 08/05/2019, 10/06/2020     Objective: Vital Signs: BP (!) 176/105 (BP Location: Left Arm, Patient Position: Sitting, Cuff Size: Normal)   Pulse 69   Resp 15   Ht 5' 1.5" (1.562 m)   Wt 137 lb (62.1 kg)   BMI 25.47 kg/m    Physical Exam Vitals and nursing note reviewed.  Constitutional:      Appearance: She is well-developed.  HENT:     Head: Normocephalic and atraumatic.  Eyes:     Conjunctiva/sclera:  Conjunctivae normal.  Cardiovascular:     Rate and Rhythm: Normal rate and regular rhythm.     Heart sounds: Normal heart sounds.  Pulmonary:     Effort: Pulmonary effort is normal.     Breath sounds: Normal breath sounds.  Abdominal:     General: Bowel sounds are normal.     Palpations: Abdomen is soft.  Musculoskeletal:     Cervical back: Normal range of motion.  Lymphadenopathy:     Cervical: No cervical adenopathy.  Skin:    General: Skin is warm and dry.     Capillary Refill: Capillary refill takes less than 2 seconds.  Neurological:     Mental Status: She is alert and oriented to person, place, and time.  Psychiatric:        Behavior: Behavior normal.      Musculoskeletal Exam: Cervical, thoracic and lumbar spine were in good range of motion.  She had mild thoracolumbar scoliosis.  Shoulder joints with good range of motion with discomfort in her right shoulder on range of motion.  Elbow joints, wrist joints, MCPs PIPs and DIPs were in good range of motion with no synovitis.  She had good range of motion of bilateral hip joint with tenderness over Left trochanteric bursa.  Hip joints with good range of motion.  Knee joints with good range of motion without any warmth swelling or effusion.  There was no tenderness over ankles or MTPs.  She was able to get up from the squatting position with minimal difficulty.  CDAI Exam: CDAI Score: -- Patient Global: --; Provider Global: -- Swollen: --; Tender: -- Joint Exam 04/06/2022   No joint exam has been documented for this visit   There is currently no information documented on the homunculus. Go to the Rheumatology activity and complete the homunculus joint exam.  Investigation: No additional findings.  Imaging: No results found.  Recent Labs: Lab Results  Component Value Date   WBC 5.6 03/16/2022   HGB 14.0 03/16/2022   PLT 319 03/16/2022   NA 135 03/16/2022   K 3.2 (L) 03/16/2022   CL 99 03/16/2022   CO2 25  03/16/2022   GLUCOSE 93 03/16/2022   BUN 17 03/16/2022   CREATININE 0.91 03/16/2022   BILITOT 0.8 03/16/2022   ALKPHOS 42 03/16/2022   AST 26 03/16/2022   ALT 17 03/16/2022   PROT 8.0 03/16/2022   ALBUMIN 4.1 03/16/2022   CALCIUM 9.1 03/16/2022   GFRAA 59 (L) 03/28/2019    Speciality Comments: No specialty comments available.  Procedures:  No procedures performed Allergies: Oxycodone   Assessment / Plan:     Visit Diagnoses: Polyarthralgia-she complains of pain and discomfort in multiple joints.  Patient gives history of episodic increased joint pain and joint swelling.  She had no response to prednisone taper which was given for the duration of over 3 weeks.  No synovitis was noted on the examination today.  All autoimmune work-up is negative so far except for elevated sedimentation rate.  Chronic pain of both shoulders -she complains of discomfort in her bilateral shoulders especially the right shoulder joint.  No warmth swelling or effusion was noted.  Plan: XR Shoulder Left, XR Shoulder Right, x-rays of bilateral shoulder joints were unremarkable.  Cyclic citrul peptide antibody, IgG, C-reactive protein  Chronic pain of both hips -she complains of discomfort in her bilateral hips.  Both hip joints were in good range of motion.  She had tenderness over left trochanteric bursa.  Plan: XR HIP UNILAT W OR W/O PELVIS 2-3 VIEWS RIGHT, XR HIP UNILAT W OR W/O PELVIS 2-3 VIEWS LEFT.  X-rays of bilateral SI joints and bilateral hip joints were unremarkable.  Chronic pain of both knees -she complains of discomfort in her bilateral knee joints.  No warmth swelling or effusion was noted.  She has some difficulty getting up from the squatting position.  Plan: XR KNEE 3 VIEW RIGHT, XR KNEE 3 VIEW LEFT.  X-rays of bilateral knee joints were unremarkable.  Elevated sed rate-her sedimentation rate was elevated in April.  I will repeat sedimentation rate today and also CRP.  Myalgia -she complains of  episodic increased muscle pain.  She had no muscular weakness or tenderness on the examination.  She had some difficulty getting up from the squatting position.  Plan: CK  Other fatigue -she gives history of chronic fatigue.  Plan: Serum protein electrophoresis with reflex  Positive ANA (antinuclear antibody) - 09/28/21: ANA 1:80NS,  RF<14, ESR 88, CRP<1 -ANA is low titer.  She gives history of sicca symptoms.  She denies any history of oral ulcers, nasal ulcers, malar rash, photosensitivity, Raynaud's phenomenon or lymphadenopathy.  I will obtain additional labs today.  Plan: Protein / creatinine ratio, urine, Anti-DNA antibody, double-stranded, RNP Antibody, Anti-Smith antibody, Sjogrens syndrome-A extractable nuclear antibody, Sjogrens syndrome-B extractable nuclear antibody, C3 and C4, Sedimentation rate  Elevated blood pressure reading-her blood pressure was 176/105.  Initial blood pressure was even higher.  She is not taking any antihypertensive medications.  We sent a note to Dr. Glori Bickers and advised patient to schedule an appointment with Dr. Glori Bickers as soon as possible for the treatment of hypertension.  Gastroesophageal reflux disease without esophagitis  Hx of migraines-she gives history of frequent migraine headaches.  Vitamin D deficiency -patient gives history of vitamin D deficiency in the past.  She complains of increased fatigue.  Plan: VITAMIN D 25 Hydroxy (Vit-D Deficiency, Fractures)  Symptoms, such as flushing, sleeplessness, headache, lack of concentration, associated with the menopause  Iron deficiency anemia secondary to inadequate dietary iron intake.  Patient states she received iron infusion in the past.  Orders: Orders Placed This Encounter  Procedures   XR Shoulder Left   XR Shoulder Right   XR HIP UNILAT W OR W/O PELVIS 2-3 VIEWS RIGHT   XR HIP UNILAT W OR W/O PELVIS 2-3 VIEWS LEFT   XR KNEE 3 VIEW RIGHT   XR KNEE 3 VIEW LEFT   Protein / creatinine ratio, urine    Anti-DNA antibody, double-stranded   Cyclic citrul peptide antibody, IgG   RNP Antibody   Anti-Smith antibody   Sjogrens syndrome-A extractable nuclear antibody   Sjogrens syndrome-B extractable nuclear antibody   C3 and C4   VITAMIN D 25 Hydroxy (Vit-D Deficiency, Fractures)   Sedimentation rate   Serum protein electrophoresis with reflex   C-reactive protein   CK   No orders of the defined types were placed in this encounter.    Follow-Up Instructions: Return for Polymyalgia, arthralgia.   Bo Merino, MD  Note - This record has been created using Editor, commissioning.  Chart creation errors have been sought, but may not always  have been located. Such creation errors do not reflect on  the standard of medical care.,

## 2022-04-06 ENCOUNTER — Ambulatory Visit (INDEPENDENT_AMBULATORY_CARE_PROVIDER_SITE_OTHER): Payer: No Typology Code available for payment source

## 2022-04-06 ENCOUNTER — Ambulatory Visit: Payer: No Typology Code available for payment source | Attending: Rheumatology | Admitting: Rheumatology

## 2022-04-06 ENCOUNTER — Encounter: Payer: Self-pay | Admitting: Rheumatology

## 2022-04-06 ENCOUNTER — Telehealth: Payer: Self-pay

## 2022-04-06 ENCOUNTER — Ambulatory Visit: Payer: No Typology Code available for payment source | Admitting: Family Medicine

## 2022-04-06 VITALS — BP 176/105 | HR 69 | Resp 15 | Ht 61.5 in | Wt 137.0 lb

## 2022-04-06 DIAGNOSIS — M25562 Pain in left knee: Secondary | ICD-10-CM

## 2022-04-06 DIAGNOSIS — M25511 Pain in right shoulder: Secondary | ICD-10-CM

## 2022-04-06 DIAGNOSIS — G8929 Other chronic pain: Secondary | ICD-10-CM

## 2022-04-06 DIAGNOSIS — K219 Gastro-esophageal reflux disease without esophagitis: Secondary | ICD-10-CM

## 2022-04-06 DIAGNOSIS — M2550 Pain in unspecified joint: Secondary | ICD-10-CM

## 2022-04-06 DIAGNOSIS — M25561 Pain in right knee: Secondary | ICD-10-CM

## 2022-04-06 DIAGNOSIS — M25552 Pain in left hip: Secondary | ICD-10-CM

## 2022-04-06 DIAGNOSIS — M25512 Pain in left shoulder: Secondary | ICD-10-CM

## 2022-04-06 DIAGNOSIS — D508 Other iron deficiency anemias: Secondary | ICD-10-CM

## 2022-04-06 DIAGNOSIS — R5383 Other fatigue: Secondary | ICD-10-CM

## 2022-04-06 DIAGNOSIS — R030 Elevated blood-pressure reading, without diagnosis of hypertension: Secondary | ICD-10-CM

## 2022-04-06 DIAGNOSIS — N951 Menopausal and female climacteric states: Secondary | ICD-10-CM

## 2022-04-06 DIAGNOSIS — R768 Other specified abnormal immunological findings in serum: Secondary | ICD-10-CM

## 2022-04-06 DIAGNOSIS — M25551 Pain in right hip: Secondary | ICD-10-CM | POA: Diagnosis not present

## 2022-04-06 DIAGNOSIS — M791 Myalgia, unspecified site: Secondary | ICD-10-CM

## 2022-04-06 DIAGNOSIS — R700 Elevated erythrocyte sedimentation rate: Secondary | ICD-10-CM

## 2022-04-06 DIAGNOSIS — Z8669 Personal history of other diseases of the nervous system and sense organs: Secondary | ICD-10-CM

## 2022-04-06 DIAGNOSIS — E559 Vitamin D deficiency, unspecified: Secondary | ICD-10-CM

## 2022-04-06 NOTE — Telephone Encounter (Signed)
Pt said she feels fine but given her elevated BP appt scheduled with Dr. Diona Browner today

## 2022-04-06 NOTE — Telephone Encounter (Signed)
Hi Dr. Glori Bickers,  Crystal Morris presented in our office today to see Dr. Estanislado Pandy as a new patient. Her blood pressure is extremely high. It was checked 3 different times with readings of 195/111, 197/111 and 176/105. The patient has been advised to reach out to you asap in regards to the elevated blood pressure. Thanks!

## 2022-04-06 NOTE — Telephone Encounter (Signed)
Please call her and schedule an appt , see how she is feeling. May need to be first available

## 2022-04-07 ENCOUNTER — Other Ambulatory Visit (HOSPITAL_COMMUNITY): Payer: Self-pay

## 2022-04-07 ENCOUNTER — Encounter: Payer: Self-pay | Admitting: Family Medicine

## 2022-04-07 ENCOUNTER — Ambulatory Visit (INDEPENDENT_AMBULATORY_CARE_PROVIDER_SITE_OTHER): Payer: No Typology Code available for payment source | Admitting: Family Medicine

## 2022-04-07 VITALS — BP 130/80 | HR 76 | Temp 98.3°F | Ht 61.5 in | Wt 134.5 lb

## 2022-04-07 DIAGNOSIS — F411 Generalized anxiety disorder: Secondary | ICD-10-CM

## 2022-04-07 DIAGNOSIS — R030 Elevated blood-pressure reading, without diagnosis of hypertension: Secondary | ICD-10-CM | POA: Diagnosis not present

## 2022-04-07 MED ORDER — HYDROXYZINE HCL 10 MG PO TABS
10.0000 mg | ORAL_TABLET | Freq: Three times a day (TID) | ORAL | 0 refills | Status: DC | PRN
  Filled 2022-04-07: qty 30, 10d supply, fill #0

## 2022-04-07 NOTE — Assessment & Plan Note (Signed)
Chronic, poorly controlled despite Lexapro 20 mg daily.  We discussed stress reduction and relaxation techniques.  She will use Atarax 10 mg 3 times daily as needed on limited basis temporarily for improvement in mood.  She will follow-up with her PCP in 4 weeks for reevaluation

## 2022-04-07 NOTE — Assessment & Plan Note (Signed)
Blood pressure elevation most likely secondary to anxiety and possibly pain from joints.  She states that at home her blood pressures are running 130/80.  I have encouraged her to follow blood pressures at home and send [place] the measurements over 1 to 2 weeks to verify it is in the normal range.

## 2022-04-07 NOTE — Progress Notes (Signed)
Patient ID: Crystal Morris, female    DOB: [birth year], 56 y.o.   MRN: [id number]  This visit was conducted in person.  BP (!) 160/90   Pulse 76   Temp 98.3 F (36.8 C) (Oral)   Ht 5' 1.5" (1.562 m)   Wt 134 lb 8 oz (61 kg)   SpO2 100%   BMI 25.00 kg/m    CC:  Chief Complaint  Patient presents with   Hypertension    Seen by Rheumatology yesterday and was told her BP was elevated    Subjective:   HPI: Crystal Morris is a 56 y.o. female patient of Dr. Lucretia Morris presenting on 04/07/2022 for Hypertension (Seen by Rheumatology yesterday and was told her BP was elevated)  No history of hypertension   She was very anxious at rheumatology OV yesterday. BP Readings from Last 3 Encounters:  04/07/22 (!) 160/90  04/06/22 (!) 176/105  03/16/22 (!) 150/86    She is having a lot of body pain and aches.   She has been checking at home 130/80s.. even this morning.   No CP, no HA, no SOB. No blurred vision.  No new neuro changes.    She does reports anxiety worsening.. ongoing  x 2-3 months.  On Lexapro 20 mg daily.       Relevant past medical, surgical, family and social history reviewed and updated as indicated. Interim medical history since our last visit reviewed. Allergies and medications reviewed and updated.     Per HPI unless specifically indicated in ROS section below Review of Systems  Constitutional:  Negative for fatigue and fever.  HENT:  Negative for congestion.   Eyes:  Negative for pain.  Respiratory:  Negative for cough and shortness of breath.   Cardiovascular:  Negative for chest pain, palpitations and leg swelling.  Gastrointestinal:  Negative for abdominal pain.  Genitourinary:  Negative for dysuria and vaginal bleeding.  Musculoskeletal:  Negative for back pain.  Neurological:  Negative for syncope, light-headedness and headaches.  Psychiatric/Behavioral:  Negative for dysphoric mood.    Objective:  BP (!) 160/90   Pulse 76   Temp 98.3 F  (36.8 C) (Oral)   Ht 5' 1.5" (1.562 m)   Wt 134 lb 8 oz (61 kg)   SpO2 100%   BMI 25.00 kg/m   Wt Readings from Last 3 Encounters:  04/07/22 134 lb 8 oz (61 kg)  04/06/22 137 lb (62.1 kg)  03/16/22 140 lb (63.5 kg)      Physical Exam Constitutional:      General: She is not in acute distress.    Appearance: Normal appearance. She is well-developed. She is not ill-appearing or toxic-appearing.  HENT:     Head: Normocephalic.     Right Ear: Hearing, tympanic membrane, ear canal and external ear normal. Tympanic membrane is not erythematous, retracted or bulging.     Left Ear: Hearing, tympanic membrane, ear canal and external ear normal. Tympanic membrane is not erythematous, retracted or bulging.     Nose: No mucosal edema or rhinorrhea.     Right Sinus: No maxillary sinus tenderness or frontal sinus tenderness.     Left Sinus: No maxillary sinus tenderness or frontal sinus tenderness.     Mouth/Throat:     Pharynx: Uvula midline.  Eyes:     General: Lids are normal. Lids are everted, no foreign bodies appreciated.     Conjunctiva/sclera: Conjunctivae normal.     Pupils: Pupils are equal, round,  and reactive to light.  Neck:     Thyroid: No thyroid mass or thyromegaly.     Vascular: No carotid bruit.     Trachea: Trachea normal.  Cardiovascular:     Rate and Rhythm: Normal rate and regular rhythm.     Pulses: Normal pulses.     Heart sounds: Normal heart sounds, S1 normal and S2 normal. No murmur heard.    No friction rub. No gallop.  Pulmonary:     Effort: Pulmonary effort is normal. No tachypnea or respiratory distress.     Breath sounds: Normal breath sounds. No decreased breath sounds, wheezing, rhonchi or rales.  Abdominal:     General: Bowel sounds are normal.     Palpations: Abdomen is soft.     Tenderness: There is no abdominal tenderness.  Musculoskeletal:     Cervical back: Normal range of motion and neck supple.  Skin:    General: Skin is warm and dry.      Findings: No rash.  Neurological:     Mental Status: She is alert.  Psychiatric:        Mood and Affect: Mood is not anxious or depressed.        Speech: Speech normal.        Behavior: Behavior normal. Behavior is cooperative.        Thought Content: Thought content normal.        Judgment: Judgment normal.       Results for orders placed or performed in visit on 04/06/22  Protein / creatinine ratio, urine  Result Value Ref Range   Creatinine, Urine 92 20 - 275 mg/dL   Protein/Creat Ratio 76 24 - 184 mg/g creat   Protein/Creatinine Ratio 0.076 0.024 - 0.184 mg/mg creat   Total Protein, Urine 7 5 - 24 mg/dL  C3 and C4  Result Value Ref Range   C3 Complement 140 83 - 193 mg/dL   C4 Complement 52 15 - 57 mg/dL  VITAMIN D 25 Hydroxy (Vit-D Deficiency, Fractures)  Result Value Ref Range   Vit D, 25-Hydroxy 31 30 - 100 ng/mL  Sedimentation rate  Result Value Ref Range   Sed Rate 6 0 - 30 mm/h  C-reactive protein  Result Value Ref Range   CRP 1.0 <8.0 mg/L  CK  Result Value Ref Range   Total CK 132 29 - 143 U/L     COVID 19 screen:  No recent travel or known exposure to COVID19 The patient denies respiratory symptoms of COVID 19 at this time. The importance of social distancing was discussed today.   Assessment and Plan    Problem List Items Addressed This Visit     Elevated blood pressure reading without diagnosis of hypertension - Primary    Blood pressure elevation most likely secondary to anxiety and possibly pain from joints.  She states that at home her blood pressures are running 130/80.  I have encouraged her to follow blood pressures at home and send [place] the measurements over 1 to 2 weeks to verify it is in the normal range.       GAD (generalized anxiety disorder)    Chronic, poorly controlled despite Lexapro 20 mg daily.  We discussed stress reduction and relaxation techniques.  She will use Atarax 10 mg 3 times daily as needed on limited basis temporarily for  improvement in mood.  She will follow-up with her PCP in 4 weeks for reevaluation      Relevant Medications  hydrOXYzine (ATARAX) 10 MG tablet     Eliezer Lofts, MD

## 2022-04-07 NOTE — Patient Instructions (Addendum)
Follow Blood pressure at home.. call or MyChart level over the next 1-2 weeks.  Goal < 140/90.  Can use Atarax as needed for anxiety.  Continue Lexapro at 20 mg daily.

## 2022-04-08 LAB — PROTEIN / CREATININE RATIO, URINE
Creatinine, Urine: 92 mg/dL (ref 20–275)
Protein/Creat Ratio: 76 mg/g creat (ref 24–184)
Protein/Creatinine Ratio: 0.076 mg/mg creat (ref 0.024–0.184)
Total Protein, Urine: 7 mg/dL (ref 5–24)

## 2022-04-08 LAB — PROTEIN ELECTROPHORESIS, SERUM, WITH REFLEX
Albumin ELP: 4.3 g/dL (ref 3.8–4.8)
Alpha 1: 0.3 g/dL (ref 0.2–0.3)
Alpha 2: 0.8 g/dL (ref 0.5–0.9)
Beta 2: 0.5 g/dL (ref 0.2–0.5)
Beta Globulin: 0.5 g/dL (ref 0.4–0.6)
Gamma Globulin: 1.1 g/dL (ref 0.8–1.7)
Total Protein: 7.6 g/dL (ref 6.1–8.1)

## 2022-04-08 LAB — C-REACTIVE PROTEIN: CRP: 1 mg/L (ref <8.0)

## 2022-04-08 LAB — RNP ANTIBODY: Ribonucleic Protein(ENA) Antibody, IgG: <1 AI

## 2022-04-08 LAB — CK: Total CK: 132 U/L (ref 29–143)

## 2022-04-08 LAB — ANTI-DNA ANTIBODY, DOUBLE-STRANDED: ds DNA Ab: <1 IU/mL

## 2022-04-08 LAB — ANTI-SMITH ANTIBODY: ENA SM Ab Ser-aCnc: <1 AI

## 2022-04-08 LAB — SJOGRENS SYNDROME-A EXTRACTABLE NUCLEAR ANTIBODY: SSA (Ro) (ENA) Antibody, IgG: <1 AI

## 2022-04-08 LAB — VITAMIN D 25 HYDROXY (VIT D DEFICIENCY, FRACTURES): Vit D, 25-Hydroxy: 31 ng/mL (ref 30–100)

## 2022-04-08 LAB — C3 AND C4
C3 Complement: 140 mg/dL (ref 83–193)
C4 Complement: 52 mg/dL (ref 15–57)

## 2022-04-08 LAB — CYCLIC CITRUL PEPTIDE ANTIBODY, IGG: Cyclic Citrullin Peptide Ab: <16 UNITS

## 2022-04-08 LAB — SJOGRENS SYNDROME-B EXTRACTABLE NUCLEAR ANTIBODY: SSB (La) (ENA) Antibody, IgG: <1 AI

## 2022-04-08 LAB — SEDIMENTATION RATE: Sed Rate: 6 mm/h (ref 0–30)

## 2022-04-09 NOTE — Progress Notes (Signed)
I will discuss results at the follow-up visit.

## 2022-04-18 ENCOUNTER — Other Ambulatory Visit (HOSPITAL_COMMUNITY): Payer: Self-pay

## 2022-04-19 ENCOUNTER — Other Ambulatory Visit (HOSPITAL_COMMUNITY): Payer: Self-pay

## 2022-04-23 NOTE — Progress Notes (Signed)
Office Visit Note  Patient: Crystal Morris             Date of Birth: [birth year]           MRN: [id number]             PCP: Abner Greenspan, MD Referring: Tower, Wynelle Fanny, MD Visit Date: 05/03/2022 Occupation: _0 @  Subjective:  Pain in joints and positive ANA  History of Present Illness: Crystal Morris is a 56 y.o. female returns today for a follow-up visit.  She states she has attributed pain and discomfort in her joints.  The pain is mostly in her right shoulder and her hip joints.  She has not noticed any joint swelling.  She denies any history of oral ulcers, nasal ulcers, malar rash, photosensitivity, Raynaud's, lymphadenopathy or inflammatory arthritis.  She has dry eyes which she relates to working long hours at night.  She also has some generalized muscle pain.  Activities of Daily Living:  Patient reports morning stiffness for 2 hours.   Patient Denies nocturnal pain.  Difficulty dressing/grooming: Reports Difficulty climbing stairs: Denies Difficulty getting out of chair: Denies Difficulty using hands for taps, buttons, cutlery, and/or writing: Denies  Review of Systems  Constitutional:  Negative for fatigue.  HENT:  Negative for mouth sores and mouth dryness.   Eyes:  Positive for dryness.  Respiratory:  Negative for shortness of breath.   Cardiovascular:  Negative for chest pain and palpitations.  Gastrointestinal:  Negative for blood in stool, constipation and diarrhea.  Endocrine: Negative for increased urination.  Genitourinary:  Positive for involuntary urination.  Musculoskeletal:  Positive for joint pain, joint pain, morning stiffness and muscle tenderness. Negative for gait problem, joint swelling, myalgias, muscle weakness and myalgias.  Skin:  Negative for color change, rash, hair loss and sensitivity to sunlight.  Allergic/Immunologic: Negative for susceptible to infections.  Neurological:  Positive for headaches. Negative for dizziness.   Hematological:  Negative for swollen glands.  Psychiatric/Behavioral:  Negative for depressed mood and sleep disturbance. The patient is nervous/anxious.     PMFS History:  Patient Active Problem List   Diagnosis Date Noted   GAD (generalized anxiety disorder) 04/07/2022   Elevated blood pressure reading without diagnosis of hypertension 04/07/2022   Elevated antinuclear antibody (ANA) level 10/03/2021   Elevated sed rate 10/03/2021   Low back pain 09/28/2021   Bilateral flank pain 09/28/2021   Multiple joint pain 09/28/2021   Abnormal urinalysis 09/28/2021   Myalgia 09/12/2021   Dyspepsia 09/12/2021   DNR (do not resuscitate) discussion 09/12/2021   Routine general medical examination at a health care facility 10/22/2020   Urinary incontinence 10/22/2020   GERD (gastroesophageal reflux disease) 05/22/2019   Mood disorder ([place]) 09/09/2018   Primary insomnia 10/18/2017   Frequent headaches 10/18/2017   Symptoms, such as flushing, sleeplessness, headache, lack of concentration, associated with the menopause 10/18/2017   Iron deficiency anemia 09/09/2014   Constipation 12/13/2009   Vitamin D deficiency 12/13/2009   Anemia, unspecified 12/03/2009   Allergic rhinitis 12/01/2009   Attention or concentration deficit 12/01/2009   Migraine headache 12/01/2009    Past Medical History:  Diagnosis Date   Anemia    GERD (gastroesophageal reflux disease)    History of chicken pox    Medical history non-contributory    Wears glasses     Family History  Problem Relation Age of Onset   Gout Mother    Rheum arthritis Mother    Rheum arthritis Father  Gout Father    Colon cancer Neg Hx    Esophageal cancer Neg Hx    Rectal cancer Neg Hx    Stomach cancer Neg Hx    Past Surgical History:  Procedure Laterality Date   CESAREAN SECTION     CLOSED REDUCTION METACARPAL WITH PERCUTANEOUS PINNING Left 07/28/2014   Procedure: CLOSED REDUCTION METACARPAL WITH PERCUTANEOUS PINNING  PROXIMAL PHALANX FRACTURES LEFT MIDDLE RING AND SMALL FINGERS;  Surgeon: Leanora Cover, MD;  Location: [facility];  Service: Orthopedics;  Laterality: Left;   COLONOSCOPY     2016   HEMORROIDECTOMY     INTRAUTERINE DEVICE INSERTION     Removed "a long time ago"   Social History   Social History Narrative   Not on file   Immunization History  Administered Date(s) Administered   Influenza,inj,Quad PF,6+ Mos 04/03/2018, 05/22/2019, 04/20/2020   PFIZER Comirnaty(Gray Top)Covid-19 Tri-Sucrose Vaccine 07/08/2019, 08/05/2019, 10/06/2020     Objective: Vital Signs: BP (!) 180/95 (BP Location: Right Arm, Patient Position: Sitting, Cuff Size: Small)   Pulse 67   Resp 15   Ht _0  (1.575 m)   Wt 142 lb (64.4 kg)   BMI 25.97 kg/m    Physical Exam Vitals and nursing note reviewed.  Constitutional:      Appearance: She is well-developed.  HENT:     Head: Normocephalic and atraumatic.  Eyes:     Conjunctiva/sclera: Conjunctivae normal.  Cardiovascular:     Rate and Rhythm: Normal rate and regular rhythm.     Heart sounds: Normal heart sounds.  Pulmonary:     Effort: Pulmonary effort is normal.     Breath sounds: Normal breath sounds.  Abdominal:     General: Bowel sounds are normal.     Palpations: Abdomen is soft.  Musculoskeletal:     Cervical back: Normal range of motion.  Lymphadenopathy:     Cervical: No cervical adenopathy.  Skin:    General: Skin is warm and dry.     Capillary Refill: Capillary refill takes less than 2 seconds.  Neurological:     Mental Status: She is alert and oriented to person, place, and time.  Psychiatric:        Behavior: Behavior normal.      Musculoskeletal Exam: Cervical, thoracic and lumbar spine were in good range of motion.  She had no SI joint tenderness.  Shoulder joints, elbow joints, wrist joints, MCPs PIPs and DIPs with good range of motion with no synovitis.  She had discomfort range of motion of her right shoulder  joint without any point tenderness.  Hip joints and knee joints with good range of motion.  She had mild tenderness over trochanteric region.  There was no tenderness over ankles or MTPs.  CDAI Exam: CDAI Score: -- Patient Global: --; Provider Global: -- Swollen: --; Tender: -- Joint Exam 05/03/2022   No joint exam has been documented for this visit   There is currently no information documented on the homunculus. Go to the Rheumatology activity and complete the homunculus joint exam.  Investigation: No additional findings.  Imaging: XR KNEE 3 VIEW RIGHT  Result Date: 04/23/2022 No medial or lateral compartment narrowing was noted.  No patellofemoral narrowing was noted.  No chondrocalcinosis was noted. Impression: Unremarkable x-rays of the knee joint.  XR KNEE 3 VIEW LEFT  Result Date: 04/23/2022 No medial or lateral compartment narrowing was noted.  No patellofemoral narrowing was noted.  No chondrocalcinosis was noted. Impression: Unremarkable x-rays of  the knee joint.  XR HIP UNILAT W OR W/O PELVIS 2-3 VIEWS LEFT  Result Date: 04/06/2022 No SI joint narrowing or sclerosis was noted.  No hip joint narrowing was noted.  No chondrocalcinosis was noted. Impression: Unremarkable x-rays of the hip joint.  XR HIP UNILAT W OR W/O PELVIS 2-3 VIEWS RIGHT  Result Date: 04/06/2022 No SI joint narrowing or sclerosis was noted.  No hip joint narrowing was noted. Impression: Unremarkable x-rays of the hip joint.  XR Shoulder Left  Result Date: 04/06/2022 No glenohumeral or acromioclavicular joint space narrowing was noted.  No chondrocalcinosis was noted. Impression: Unremarkable x-rays of the shoulder.  XR Shoulder Right  Result Date: 04/06/2022 No glenohumeral or acromioclavicular joint space narrowing was noted.  No chondrocalcinosis was noted. Impression: Unremarkable x-rays of the shoulder.   Recent Labs: Lab Results  Component Value Date   WBC 5.6 03/16/2022   HGB 14.0  03/16/2022   PLT 319 03/16/2022   NA 135 03/16/2022   K 3.2 (L) 03/16/2022   CL 99 03/16/2022   CO2 25 03/16/2022   GLUCOSE 93 03/16/2022   BUN 17 03/16/2022   CREATININE 0.91 03/16/2022   BILITOT 0.8 03/16/2022   ALKPHOS 42 03/16/2022   AST 26 03/16/2022   ALT 17 03/16/2022   PROT 7.6 04/06/2022   ALBUMIN 4.1 03/16/2022   CALCIUM 9.1 03/16/2022   GFRAA 59 (L) 03/28/2019   April 06, 2022 ENA (Smith, RNP, [place], SSB, dsDNA) negative, C3-C4 normal, protein creatinine ratio normal, anti-CCP negative, ESR 6, C-reactive protein 1.0, CK132, SPEP normal vitamin D 31  09/28/21: ANA 1:80NS, RF<14, ESR 88, CRP<1    Speciality Comments: No specialty comments available.  Procedures:  No procedures performed Allergies: Oxycodone   Assessment / Plan:     Visit Diagnoses: Polyarthralgia - History of episodic joint pain and swelling per patient.  She had no response to prednisone taper in the past.  Her ANA was low titer positive.  Labs obtained on April 06, 2022 showed ENA (Smith RNP, SSA, [place], dsDNA antibodies) were negative.  Complements were normal.  Sedimentation rate was normal.  I reviewed lab work with the patient.  She denies any history of oral ulcers, nasal ulcers, malar rash, photosensitivity, Raynaud's or inflammatory arthritis.  I advised her to contact me if she develops any new symptoms.  At this point she does not have features of lupus or lupus-like syndrome.  I advised her to return in a year for a follow-up visit.  If she develops any new symptoms she should call us sooner.  Chronic pain of both shoulders - She continues to have discomfort in her right shoulder joint.  No effusion or warmth was noted.  X-rays were unremarkable.  X-ray findings were discussed with the patient.  I offered physical therapy which she declined.  I gave her a handout on shoulder exercises.  If her symptoms persist she should contact me.  Chronic pain of both hips - She had good range of motion of  bilateral hip joints.  X-rays of hip joints and SI joints were unremarkable.  X-ray findings were reviewed with the patient.  She had mild tenderness over trochanteric region.  A handout on IT band stretches was given.  Chronic pain of both knees - History of chronic discomfort.  No warmth swelling or effusion was noted.  X-rays of unremarkable.  X-ray findings were reviewed with the patient.  Elevated sed rate - Repeat sed rate is normal.  C-reactive protein is  normal.  Myalgia - History of episodic increased muscle pain.  No muscular weakness was noted on the examination.  She has episodic increased muscle pain.  She had no tenderness on the examination.  She denies any history of hyperalgesia.  Need for regular exercise and stretching was emphasized.  Other fatigue - History of chronic fatigue per patient.  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  Elevated blood pressure reading-her blood pressure was elevated at the last visit.  Today also her blood pressure was  190/93 and repeat blood pressure was elevated.  Patient states she has been taking blood pressure at work and at home which has been coming below 120/80.  She is discussing this further with her PCP.  Hx of migraines  Gastroesophageal reflux disease without esophagitis  Vitamin D deficiency-vitamin D was 31 which was low  normal.  I advised her to take vitamin D 22,000 units daily.  Orders: No orders of the defined types were placed in this encounter.  No orders of the defined types were placed in this encounter.    Follow-Up Instructions: Return in about 1 year (around 05/04/2023) for +ANA, joint pain.   Bo Merino, MD  Note - This record has been created using Editor, commissioning.  Chart creation errors have been sought, but may not always  have been located. Such creation errors do not reflect on  the standard of medical care.

## 2022-05-03 ENCOUNTER — Encounter: Payer: Self-pay | Admitting: Rheumatology

## 2022-05-03 ENCOUNTER — Ambulatory Visit: Payer: No Typology Code available for payment source | Attending: Rheumatology | Admitting: Rheumatology

## 2022-05-03 VITALS — BP 180/95 | HR 67 | Resp 15 | Ht 62.0 in | Wt 142.0 lb

## 2022-05-03 DIAGNOSIS — G8929 Other chronic pain: Secondary | ICD-10-CM

## 2022-05-03 DIAGNOSIS — M25551 Pain in right hip: Secondary | ICD-10-CM | POA: Diagnosis not present

## 2022-05-03 DIAGNOSIS — R5383 Other fatigue: Secondary | ICD-10-CM

## 2022-05-03 DIAGNOSIS — M25511 Pain in right shoulder: Secondary | ICD-10-CM

## 2022-05-03 DIAGNOSIS — M25562 Pain in left knee: Secondary | ICD-10-CM

## 2022-05-03 DIAGNOSIS — E559 Vitamin D deficiency, unspecified: Secondary | ICD-10-CM

## 2022-05-03 DIAGNOSIS — R030 Elevated blood-pressure reading, without diagnosis of hypertension: Secondary | ICD-10-CM

## 2022-05-03 DIAGNOSIS — M25512 Pain in left shoulder: Secondary | ICD-10-CM

## 2022-05-03 DIAGNOSIS — M25552 Pain in left hip: Secondary | ICD-10-CM

## 2022-05-03 DIAGNOSIS — M25561 Pain in right knee: Secondary | ICD-10-CM | POA: Diagnosis not present

## 2022-05-03 DIAGNOSIS — M2550 Pain in unspecified joint: Secondary | ICD-10-CM | POA: Diagnosis not present

## 2022-05-03 DIAGNOSIS — M791 Myalgia, unspecified site: Secondary | ICD-10-CM

## 2022-05-03 DIAGNOSIS — R700 Elevated erythrocyte sedimentation rate: Secondary | ICD-10-CM

## 2022-05-03 DIAGNOSIS — K219 Gastro-esophageal reflux disease without esophagitis: Secondary | ICD-10-CM

## 2022-05-03 DIAGNOSIS — Z8669 Personal history of other diseases of the nervous system and sense organs: Secondary | ICD-10-CM

## 2022-05-03 NOTE — Patient Instructions (Signed)
Shoulder Exercises Ask your health care provider which exercises are safe for you. Do exercises exactly as told by your health care provider and adjust them as directed. It is normal to feel mild stretching, pulling, tightness, or discomfort as you do these exercises. Stop right away if you feel sudden pain or your pain gets worse. Do not begin these exercises until told by your health care provider. Stretching exercises External rotation and abduction This exercise is sometimes called corner stretch. The exercise rotates your arm outward (external rotation) and moves your arm out from your body (abduction). Stand in a doorway with one of your feet slightly in front of the other. This is called a staggered stance. If you cannot reach your forearms to the door frame, stand facing a corner of a room. Choose one of the following positions as told by your health care provider: Place your hands and forearms on the door frame above your head. Place your hands and forearms on the door frame at the height of your head. Place your hands on the door frame at the height of your elbows. Slowly move your weight onto your front foot until you feel a stretch across your chest and in the front of your shoulders. Keep your head and chest upright and keep your abdominal muscles tight. Hold for __________ seconds. To release the stretch, shift your weight to your back foot. Repeat __________ times. Complete this exercise __________ times a day. Extension, standing  Stand and hold a broomstick, a cane, or a similar object behind your back. Your hands should be a little wider than shoulder-width apart. Your palms should face away from your back. Keeping your elbows straight and your shoulder muscles relaxed, move the stick away from your body until you feel a stretch in your shoulders (extension). Avoid shrugging your shoulders while you move the stick. Keep your shoulder blades tucked down toward the middle of your  back. Hold for __________ seconds. Slowly return to the starting position. Repeat __________ times. Complete this exercise __________ times a day. Range-of-motion exercises Pendulum  Stand near a wall or a surface that you can hold onto for balance. Bend at the waist and let your left / right arm hang straight down. Use your other arm to support you. Keep your back straight and do not lock your knees. Relax your left / right arm and shoulder muscles, and move your hips and your trunk so your left / right arm swings freely. Your arm should swing because of the motion of your body, not because you are using your arm or shoulder muscles. Keep moving your hips and trunk so your arm swings in the following directions, as told by your health care provider: Side to side. Forward and backward. In clockwise and counterclockwise circles. Continue each motion for __________ seconds, or for as long as told by your health care provider. Slowly return to the starting position. Repeat __________ times. Complete this exercise __________ times a day. Shoulder flexion, standing  Stand and hold a broomstick, a cane, or a similar object. Place your hands a little more than shoulder-width apart on the object. Your left / right hand should be palm-up, and your other hand should be palm-down. Keep your elbow straight and your shoulder muscles relaxed. Push the stick up with your healthy arm to raise your left / right arm in front of your body, and then over your head until you feel a stretch in your shoulder (flexion). Avoid shrugging your shoulder   while you raise your arm. Keep your shoulder blade tucked down toward the middle of your back. Hold for __________ seconds. Slowly return to the starting position. Repeat __________ times. Complete this exercise __________ times a day. Shoulder abduction, standing  Stand and hold a broomstick, a cane, or a similar object. Place your hands a little more than  shoulder-width apart on the object. Your left / right hand should be palm-up, and your other hand should be palm-down. Keep your elbow straight and your shoulder muscles relaxed. Push the object across your body toward your left / right side. Raise your left / right arm to the side of your body (abduction) until you feel a stretch in your shoulder. Do not raise your arm above shoulder height unless your health care provider tells you to do that. If directed, raise your arm over your head. Avoid shrugging your shoulder while you raise your arm. Keep your shoulder blade tucked down toward the middle of your back. Hold for __________ seconds. Slowly return to the starting position. Repeat __________ times. Complete this exercise __________ times a day. Internal rotation  Place your left / right hand behind your back, palm-up. Use your other hand to dangle an exercise band, a broomstick, or a similar object over your shoulder. Grasp the band with your left / right hand so you are holding on to both ends. Gently pull up on the band until you feel a stretch in the front of your left / right shoulder. The movement of your arm toward the center of your body is called internal rotation. Avoid shrugging your shoulder while you raise your arm. Keep your shoulder blade tucked down toward the middle of your back. Hold for __________ seconds. Release the stretch by letting go of the band and lowering your hands. Repeat __________ times. Complete this exercise __________ times a day. Strengthening exercises External rotation  Sit in a stable chair without armrests. Secure an exercise band to a stable object at elbow height on your left / right side. Place a soft object, such as a folded towel or a small pillow, between your left / right upper arm and your body to move your elbow about 4 inches (10 cm) away from your side. Hold the end of the exercise band so it is tight and there is no slack. Keeping your  elbow pressed against the soft object, slowly move your forearm out, away from your abdomen (external rotation). Keep your body steady so only your forearm moves. Hold for __________ seconds. Slowly return to the starting position. Repeat __________ times. Complete this exercise __________ times a day. Shoulder abduction  Sit in a stable chair without armrests, or stand up. Hold a __________ lb / kg weight in your left / right hand, or hold an exercise band with both hands. Start with your arms straight down and your left / right palm facing in, toward your body. Slowly lift your left / right hand out to your side (abduction). Do not lift your hand above shoulder height unless your health care provider tells you that this is safe. Keep your arms straight. Avoid shrugging your shoulder while you do this movement. Keep your shoulder blade tucked down toward the middle of your back. Hold for __________ seconds. Slowly lower your arm, and return to the starting position. Repeat __________ times. Complete this exercise __________ times a day. Shoulder extension  Sit in a stable chair without armrests, or stand up. Secure an exercise band to a   stable object in front of you so it is at shoulder height. Hold one end of the exercise band in each hand. Straighten your elbows and lift your hands up to shoulder height. Squeeze your shoulder blades together as you pull your hands down to the sides of your thighs (extension). Stop when your hands are straight down by your sides. Do not let your hands go behind your body. Hold for __________ seconds. Slowly return to the starting position. Repeat __________ times. Complete this exercise __________ times a day. Shoulder row  Sit in a stable chair without armrests, or stand up. Secure an exercise band to a stable object in front of you so it is at chest height. Hold one end of the exercise band in each hand. Position your palms so that your thumbs are  facing the ceiling (neutral position). Bend each of your elbows to a 90-degree angle (right angle) and keep your upper arms at your sides. Step back or move the chair back until the band is tight and there is no slack. Slowly pull your elbows back behind you. Hold for __________ seconds. Slowly return to the starting position. Repeat __________ times. Complete this exercise __________ times a day. Shoulder press-ups  Sit in a stable chair that has armrests. Sit upright, with your feet flat on the floor. Put your hands on the armrests so your elbows are bent and your fingers are pointing forward. Your hands should be about even with the sides of your body. Push down on the armrests and use your arms to lift yourself off the chair. Straighten your elbows and lift yourself up as much as you comfortably can. Move your shoulder blades down, and avoid letting your shoulders move up toward your ears. Keep your feet on the ground. As you get stronger, your feet should support less of your body weight as you lift yourself up. Hold for __________ seconds. Slowly lower yourself back into the chair. Repeat __________ times. Complete this exercise __________ times a day. Wall push-ups  Stand so you are facing a stable wall. Your feet should be about one arm-length away from the wall. Lean forward and place your palms on the wall at shoulder height. Keep your feet flat on the floor as you bend your elbows and lean forward toward the wall. Hold for __________ seconds. Straighten your elbows to push yourself back to the starting position. Repeat __________ times. Complete this exercise __________ times a day. This information is not intended to replace advice given to you by your health care provider. Make sure you discuss any questions you have with your health care provider. Document Revised: 07/12/2021 Document Reviewed: 07/12/2021 [employer] Patient Education  2023 [employer] Iliotibial Band  Syndrome Rehab Ask your health care provider which exercises are safe for you. Do exercises exactly as told by your health care provider and adjust them as directed. It is normal to feel mild stretching, pulling, tightness, or discomfort as you do these exercises. Stop right away if you feel sudden pain or your pain gets significantly worse. Do not begin these exercises until told by your health care provider. Stretching and range-of-motion exercises These exercises warm up your muscles and joints and improve the movement and flexibility of your hip and pelvis. Quadriceps stretch, prone  Lie on your abdomen (prone position) on a firm surface, such as a bed or padded floor. Bend your left / right knee and reach back to hold your ankle or pant leg. If you cannot   reach your ankle or pant leg, loop a belt around your foot and grab the belt instead. Gently pull your heel toward your buttocks. Your knee should not slide out to the side. You should feel a stretch in the front of your thigh and knee (quadriceps). Hold this position for __________ seconds. Repeat __________ times. Complete this exercise __________ times a day. Iliotibial band stretch An iliotibial band is a strong band of muscle tissue that runs from the outer side of your hip to the outer side of your thigh and knee. Lie on your side with your left / right leg in the top position. Bend both of your knees and grab your left / right ankle. Stretch out your bottom arm to help you balance. Slowly bring your top knee back so your thigh goes behind your trunk. Slowly lower your top leg toward the floor until you feel a gentle stretch on the outside of your left / right hip and thigh. If you do not feel a stretch and your knee will not fall farther, place the heel of your other foot on top of your knee and pull your knee down toward the floor with your foot. Hold this position for __________ seconds. Repeat __________ times. Complete this  exercise __________ times a day. Strengthening exercises These exercises build strength and endurance in your hip and pelvis. Endurance is the ability to use your muscles for a long time, even after they get tired. Straight leg raises, side-lying This exercise strengthens the muscles that rotate the leg at the hip and move it away from your body (hip abductors). Lie on your side with your left / right leg in the top position. Lie so your head, shoulder, hip, and knee line up. You may bend your bottom knee to help you balance. Roll your hips slightly forward so your hips are stacked directly over each other and your left / right knee is facing forward. Tense the muscles in your outer thigh and lift your top leg 4-6 inches (10-15 cm). Hold this position for __________ seconds. Slowly lower your leg to return to the starting position. Let your muscles relax completely before doing another repetition. Repeat __________ times. Complete this exercise __________ times a day. Leg raises, prone This exercise strengthens the muscles that move the hips backward (hip extensors). Lie on your abdomen (prone position) on your bed or a firm surface. You can put a pillow under your hips if that is more comfortable for your lower back. Bend your left / right knee so your foot is straight up in the air. Squeeze your buttocks muscles and lift your left / right thigh off the bed. Do not let your back arch. Tense your thigh muscle as hard as you can without increasing any knee pain. Hold this position for __________ seconds. Slowly lower your leg to return to the starting position and allow it to relax completely. Repeat __________ times. Complete this exercise __________ times a day. Hip hike Stand sideways on a bottom step. Stand on your left / right leg with your other foot unsupported next to the step. You can hold on to a railing or wall for balance if needed. Keep your knees straight and your torso square.  Then lift your left / right hip up toward the ceiling. Slowly let your left / right hip lower toward the floor, past the starting position. Your foot should get closer to the floor. Do not lean or bend your knees. Repeat __________ times.   Complete this exercise __________ times a day. This information is not intended to replace advice given to you by your health care provider. Make sure you discuss any questions you have with your health care provider. Document Revised: 07/30/2019 Document Reviewed: 07/30/2019 Else[employer] Patient Education  2023 [employer]  

## 2022-05-05 ENCOUNTER — Ambulatory Visit: Payer: No Typology Code available for payment source | Admitting: Family Medicine

## 2022-05-05 NOTE — Progress Notes (Deleted)
   Subjective:    Patient ID: Crystal Morris, female    DOB: May 16, 1966, 56 y.o.   MRN: [id number]  HPI Pt presents for f/u of elevated bp    This was high recently here (reviewed notes from Dr Ermalene Searing and also rheumatology) Noted worse anxiety at that time also - takex lexapro 20 mg and hydroxyzine was px 10 mg tid prn  Per pt - bp running in 130s/80s at home It was 190/93 at the rheumatology office      Review of Systems     Objective:   Physical Exam        Assessment & Plan:

## 2022-05-10 ENCOUNTER — Encounter: Payer: Self-pay | Admitting: Family Medicine

## 2022-05-10 ENCOUNTER — Ambulatory Visit (INDEPENDENT_AMBULATORY_CARE_PROVIDER_SITE_OTHER): Payer: No Typology Code available for payment source | Admitting: Family Medicine

## 2022-05-10 VITALS — BP 122/76 | HR 80 | Temp 97.6°F | Ht 61.5 in | Wt 142.0 lb

## 2022-05-10 DIAGNOSIS — F411 Generalized anxiety disorder: Secondary | ICD-10-CM | POA: Diagnosis not present

## 2022-05-10 DIAGNOSIS — M2550 Pain in unspecified joint: Secondary | ICD-10-CM

## 2022-05-10 DIAGNOSIS — R030 Elevated blood-pressure reading, without diagnosis of hypertension: Secondary | ICD-10-CM

## 2022-05-10 NOTE — Progress Notes (Signed)
Subjective:    Patient ID: Crystal Morris, female    DOB: [birth year], 56 y.o.   MRN: [id number]  HPI Pt presents for f/u of elevated bp and mood   Wt Readings from Last 3 Encounters:  05/10/22 142 lb (64.4 kg)  05/03/22 142 lb (64.4 kg)  04/07/22 134 lb 8 oz (61 kg)   26.40 kg/m  BP elevation BP Readings from Last 3 Encounters:  05/10/22 122/76  05/03/22 (!) 180/95  04/07/22 130/80   Pulse Readings from Last 3 Encounters:  05/10/22 80  05/03/22 67  04/07/22 76   At home her bp is fine  At work as well   Feeling better  Joint pain is much better   All her symptoms left  Now feels much better  Joint pain is better   Trying to take care of herself     Had a visit with Dr Jon Billings re: joint pain and elevated ANA   Has GAD  Lexapro 20 mg daily  Atarax 10 mg tid prn - did not end up trying or needing this   Lab Results  Component Value Date   CREATININE 0.91 03/16/2022   BUN 17 03/16/2022   NA 135 03/16/2022   K 3.2 (L) 03/16/2022   CL 99 03/16/2022   CO2 25 03/16/2022  Was vomiting at that time  Has zofran to use as needed Occ gets nausea /has zofran - has seen GI        Review of Systems  Constitutional:  Negative for activity change, appetite change, fatigue, fever and unexpected weight change.  HENT:  Negative for congestion, ear pain, rhinorrhea, sinus pressure and sore throat.   Eyes:  Negative for pain, redness and visual disturbance.  Respiratory:  Negative for cough, shortness of breath and wheezing.   Cardiovascular:  Negative for chest pain and palpitations.  Gastrointestinal:  Negative for abdominal pain, blood in stool, constipation and diarrhea.  Endocrine: Negative for polydipsia and polyuria.  Genitourinary:  Negative for dysuria, frequency and urgency.  Musculoskeletal:  Negative for arthralgias, back pain and myalgias.  Skin:  Negative for pallor and rash.  Allergic/Immunologic: Negative for environmental allergies.   Neurological:  Negative for dizziness, syncope and headaches.  Hematological:  Negative for adenopathy. Does not bruise/bleed easily.  Psychiatric/Behavioral:  Negative for decreased concentration and dysphoric mood. The patient is not nervous/anxious.        Objective:   Physical Exam Constitutional:      General: She is not in acute distress.    Appearance: Normal appearance. She is well-developed and normal weight. She is not ill-appearing or diaphoretic.  HENT:     Head: Normocephalic and atraumatic.  Eyes:     General: No scleral icterus.    Conjunctiva/sclera: Conjunctivae normal.     Pupils: Pupils are equal, round, and reactive to light.  Neck:     Thyroid: No thyromegaly.     Vascular: No carotid bruit or JVD.  Cardiovascular:     Rate and Rhythm: Normal rate and regular rhythm.     Heart sounds: Normal heart sounds.     No gallop.  Pulmonary:     Effort: Pulmonary effort is normal. No respiratory distress.     Breath sounds: Normal breath sounds. No stridor. No wheezing, rhonchi or rales.  Abdominal:     General: There is no distension or abdominal bruit.     Palpations: Abdomen is soft.  Musculoskeletal:     Cervical back: Normal range  of motion and neck supple.     Right lower leg: No edema.     Left lower leg: No edema.     Comments: No acute joint changes   Lymphadenopathy:     Cervical: No cervical adenopathy.  Skin:    General: Skin is warm and dry.     Coloration: Skin is not pale.     Findings: No rash.  Neurological:     Mental Status: She is alert.     Coordination: Coordination normal.     Deep Tendon Reflexes: Reflexes are normal and symmetric. Reflexes normal.  Psychiatric:        Mood and Affect: Mood normal.     Comments: Pleasant  Cheerful Good mood  Not anx            Assessment & Plan:   Problem List Items Addressed This Visit       Other   Elevated blood pressure reading without diagnosis of hypertension - Primary    In  the normal range today  BP: 122/76  Feeling better Anxiety is much improved   She will continue to monitor       GAD (generalized anxiety disorder)    This is much improved from last visit  Never did take the hydroxyzine  Less stressors   Physical symptoms/ joint pain also improved       Multiple joint pain    Much improved  Did see rheumatology for this and elevated ANA Reassuring w/u  Note reviewed

## 2022-05-10 NOTE — Assessment & Plan Note (Signed)
This is much improved from last visit  Never did take the hydroxyzine  Less stressors   Physical symptoms/ joint pain also improved

## 2022-05-10 NOTE — Assessment & Plan Note (Signed)
In the normal range today  BP: 122/76  Feeling better Anxiety is much improved   She will continue to monitor

## 2022-05-10 NOTE — Patient Instructions (Signed)
Take care of yourself   Blood pressure is improved  Let us know if this goes up or you have any problems   Continue current medicines

## 2022-05-10 NOTE — Assessment & Plan Note (Signed)
Much improved  Did see rheumatology for this and elevated ANA Reassuring w/u  Note reviewed

## 2022-05-15 ENCOUNTER — Encounter: Payer: Self-pay | Admitting: Oncology

## 2022-05-17 ENCOUNTER — Other Ambulatory Visit (HOSPITAL_COMMUNITY): Payer: Self-pay

## 2022-05-17 ENCOUNTER — Other Ambulatory Visit: Payer: Self-pay | Admitting: Physician Assistant

## 2022-05-17 ENCOUNTER — Other Ambulatory Visit: Payer: Self-pay | Admitting: Family Medicine

## 2022-05-17 ENCOUNTER — Other Ambulatory Visit: Payer: Self-pay

## 2022-05-17 MED ORDER — OMEPRAZOLE 40 MG PO CPDR
40.0000 mg | DELAYED_RELEASE_CAPSULE | Freq: Two times a day (BID) | ORAL | 6 refills | Status: DC
  Filled 2022-05-17: qty 60, 30d supply, fill #0
  Filled 2022-06-16: qty 60, 30d supply, fill #1
  Filled 2022-07-20: qty 60, 30d supply, fill #2
  Filled 2022-08-14: qty 60, 30d supply, fill #3
  Filled 2022-09-20: qty 120, 60d supply, fill #4
  Filled 2022-12-14: qty 60, 30d supply, fill #5

## 2022-05-20 ENCOUNTER — Encounter: Payer: Self-pay | Admitting: Oncology

## 2022-05-25 ENCOUNTER — Other Ambulatory Visit (HOSPITAL_COMMUNITY): Payer: Self-pay

## 2022-06-06 ENCOUNTER — Encounter: Payer: Self-pay | Admitting: Oncology

## 2022-06-07 ENCOUNTER — Other Ambulatory Visit (HOSPITAL_COMMUNITY): Payer: Self-pay

## 2022-06-07 ENCOUNTER — Other Ambulatory Visit: Payer: Self-pay

## 2022-06-07 ENCOUNTER — Ambulatory Visit (INDEPENDENT_AMBULATORY_CARE_PROVIDER_SITE_OTHER): Payer: 59 | Admitting: Family Medicine

## 2022-06-07 ENCOUNTER — Encounter: Payer: Self-pay | Admitting: Oncology

## 2022-06-07 VITALS — BP 126/80 | HR 95 | Temp 98.3°F | Ht 61.5 in | Wt 144.2 lb

## 2022-06-07 DIAGNOSIS — R051 Acute cough: Secondary | ICD-10-CM | POA: Insufficient documentation

## 2022-06-07 LAB — POCT INFLUENZA A/B
Influenza A, POC: NEGATIVE
Influenza B, POC: NEGATIVE

## 2022-06-07 LAB — POC COVID19 BINAXNOW: SARS Coronavirus 2 Ag: NEGATIVE

## 2022-06-07 MED ORDER — AZITHROMYCIN 250 MG PO TABS
ORAL_TABLET | ORAL | 0 refills | Status: AC
  Filled 2022-06-07 (×2): qty 6, 5d supply, fill #0

## 2022-06-07 MED ORDER — PROMETHAZINE-DM 6.25-15 MG/5ML PO SYRP
5.0000 mL | ORAL_SOLUTION | Freq: Every evening | ORAL | 0 refills | Status: DC | PRN
  Filled 2022-06-07 (×2): qty 118, 23d supply, fill #0

## 2022-06-07 NOTE — Patient Instructions (Signed)
Rest. Fluids.  Complete course of antibiotics. Can use plain Mucinex or Mucinex DM during the day.  Use prescription cough suppressant at night.  Go to ER if severe shortness of breath.

## 2022-06-07 NOTE — Progress Notes (Signed)
Patient ID: Crystal Morris, female    DOB: 1966-05-04, 57 y.o.   MRN: [id number]  This visit was conducted in person.  BP 126/80   Pulse 95   Temp 98.3 F (36.8 C) (Oral)   Ht 5' 1.5" (1.562 m)   Wt 144 lb 3.2 oz (65.4 kg)   SpO2 98%   BMI 26.81 kg/m    CC:  Chief Complaint  Patient presents with   Cough   Nasal Congestion    X 2 weeks.     Subjective:   HPI: Crystal Morris is a 57 y.o. female  patient of Dr. Marliss Coots with history of  elevated BP ( not yet Dx with HTN) presenting on 06/07/2022 for Cough and Nasal Congestion (X 2 weeks. )   Date of onset:  2 weeks Initial symptoms included  nasal congestion, dry cough Symptoms progressed to cough, now productive yellow green.  No fever. No wheeze, no SOB  Facial headache.  No ear pain, no ST. Hoarse voice.  No body aches.    Sick contacts:  works at [employer] testing:   none     She has tried to treat with OTC Theraflu and Tamiflu.     No history of chronic lung disease such as asthma or COPD. Non-smoker.       Relevant past medical, surgical, family and social history reviewed and updated as indicated. Interim medical history since our last visit reviewed. Allergies and medications reviewed and updated.     Per HPI unless specifically indicated in ROS section below Review of Systems  Constitutional:  Negative for fatigue and fever.  HENT:  Negative for congestion.   Eyes:  Negative for pain.  Respiratory:  Negative for cough and shortness of breath.   Cardiovascular:  Negative for chest pain, palpitations and leg swelling.  Gastrointestinal:  Negative for abdominal pain.  Genitourinary:  Negative for dysuria and vaginal bleeding.  Musculoskeletal:  Negative for back pain.  Neurological:  Negative for syncope, light-headedness and headaches.  Psychiatric/Behavioral:  Negative for dysphoric mood.    Objective:  BP 126/80   Pulse 95   Temp 98.3 F (36.8 C) (Oral)   Ht 5' 1.5" (1.562  m)   Wt 144 lb 3.2 oz (65.4 kg)   SpO2 98%   BMI 26.81 kg/m   Wt Readings from Last 3 Encounters:  06/07/22 144 lb 3.2 oz (65.4 kg)  05/10/22 142 lb (64.4 kg)  05/03/22 142 lb (64.4 kg)      Physical Exam Constitutional:      General: She is not in acute distress.    Appearance: Normal appearance. She is well-developed. She is not ill-appearing or toxic-appearing.  HENT:     Head: Normocephalic.     Right Ear: Hearing, tympanic membrane, ear canal and external ear normal. Tympanic membrane is not erythematous, retracted or bulging.     Left Ear: Hearing, tympanic membrane, ear canal and external ear normal. Tympanic membrane is not erythematous, retracted or bulging.     Nose: No mucosal edema or rhinorrhea.     Right Sinus: No maxillary sinus tenderness or frontal sinus tenderness.     Left Sinus: No maxillary sinus tenderness or frontal sinus tenderness.     Mouth/Throat:     Pharynx: Uvula midline.  Eyes:     General: Lids are normal. Lids are everted, no foreign bodies appreciated.     Conjunctiva/sclera: Conjunctivae normal.     Pupils: Pupils are  equal, round, and reactive to light.  Neck:     Thyroid: No thyroid mass or thyromegaly.     Vascular: No carotid bruit.     Trachea: Trachea normal.  Cardiovascular:     Rate and Rhythm: Normal rate and regular rhythm.     Pulses: Normal pulses.     Heart sounds: Normal heart sounds, S1 normal and S2 normal. No murmur heard.    No friction rub. No gallop.  Pulmonary:     Effort: Pulmonary effort is normal. No tachypnea or respiratory distress.     Breath sounds: Normal breath sounds. No decreased breath sounds, wheezing, rhonchi or rales.  Abdominal:     General: Bowel sounds are normal.     Palpations: Abdomen is soft.     Tenderness: There is no abdominal tenderness.  Musculoskeletal:     Cervical back: Normal range of motion and neck supple.  Skin:    General: Skin is warm and dry.     Findings: No rash.   Neurological:     Mental Status: She is alert.  Psychiatric:        Mood and Affect: Mood is not anxious or depressed.        Speech: Speech normal.        Behavior: Behavior normal. Behavior is cooperative.        Thought Content: Thought content normal.        Judgment: Judgment normal.       Results for orders placed or performed in visit on 04/06/22  Protein / creatinine ratio, urine  Result Value Ref Range   Creatinine, Urine 92 20 - 275 mg/dL   Protein/Creat Ratio 76 24 - 184 mg/g creat   Protein/Creatinine Ratio 0.076 0.024 - 0.184 mg/mg creat   Total Protein, Urine 7 5 - 24 mg/dL  Anti-DNA antibody, double-stranded  Result Value Ref Range   ds DNA Ab <1 IU/mL  Cyclic citrul peptide antibody, IgG  Result Value Ref Range   Cyclic Citrullin Peptide Ab <16 UNITS  RNP Antibody  Result Value Ref Range   Ribonucleic Protein(ENA) Antibody, IgG <1.0 NEG <1.0 NEG AI  Anti-Smith antibody  Result Value Ref Range   ENA SM Ab Ser-aCnc <1.0 NEG <1.0 NEG AI  Sjogrens syndrome-A extractable nuclear antibody  Result Value Ref Range   SSA (Ro) (ENA) Antibody, IgG <1.0 NEG <1.0 NEG AI  Sjogrens syndrome-B extractable nuclear antibody  Result Value Ref Range   SSB (La) (ENA) Antibody, IgG <1.0 NEG <1.0 NEG AI  C3 and C4  Result Value Ref Range   C3 Complement 140 83 - 193 mg/dL   C4 Complement 52 15 - 57 mg/dL  VITAMIN D 25 Hydroxy (Vit-D Deficiency, Fractures)  Result Value Ref Range   Vit D, 25-Hydroxy 31 30 - 100 ng/mL  Sedimentation rate  Result Value Ref Range   Sed Rate 6 0 - 30 mm/h  Serum protein electrophoresis with reflex  Result Value Ref Range   Total Protein 7.6 6.1 - 8.1 g/dL   Albumin ELP 4.3 3.8 - 4.8 g/dL   Alpha 1 0.3 0.2 - 0.3 g/dL   Alpha 2 0.8 0.5 - 0.9 g/dL   Beta Globulin 0.5 0.4 - 0.6 g/dL   Beta 2 0.5 0.2 - 0.5 g/dL   Gamma Globulin 1.1 0.8 - 1.7 g/dL   SPE Interp.    C-reactive protein  Result Value Ref Range   CRP 1.0 <8.0 mg/L  CK  Result  Value Ref Range   Total CK 132 29 - 143 U/L     COVID 19 screen:  No recent travel or known exposure to COVID19 The patient denies respiratory symptoms of COVID 19 at this time. The importance of social distancing was discussed today.   Assessment and Plan    Problem List Items Addressed This Visit     Acute cough - Primary    Acute, worsening now with productive cough.  Likely started as viral upper respiratory tract infection now with bacterial superinfection.  Will treat with antibiotics given greater than 10 days of illness.  Prescription sent in for azithromycin pack.  Cough suppressant at night and Mucinex DM during the day. Return and ER precautions provided.      Relevant Orders   POCT Influenza A/B   POC COVID-19   Meds ordered this encounter  Medications   azithromycin (ZITHROMAX) 250 MG tablet    Sig: 2 tab po x 1 day then 1 tab po daily    Dispense:  6 tablet    Refill:  0   promethazine-dextromethorphan (PROMETHAZINE-DM) 6.25-15 MG/5ML syrup    Sig: Take 5 mLs by mouth at bedtime as needed for cough.    Dispense:  118 mL    Refill:  0   Orders Placed This Encounter  Procedures   POCT Influenza A/B   POC COVID-19    Order Specific Question:   Previously tested for COVID-19    Answer:   Yes    Order Specific Question:   Resident in a congregate (group) care setting    Answer:   No    Order Specific Question:   Employed in healthcare setting    Answer:   No    Order Specific Question:   Pregnant    Answer:   No     Kerby Nora, MD

## 2022-06-07 NOTE — Assessment & Plan Note (Signed)
Acute, worsening now with productive cough.  Likely started as viral upper respiratory tract infection now with bacterial superinfection.  Will treat with antibiotics given greater than 10 days of illness.  Prescription sent in for azithromycin pack.  Cough suppressant at night and Mucinex DM during the day. Return and ER precautions provided.

## 2022-06-16 ENCOUNTER — Other Ambulatory Visit: Payer: Self-pay | Admitting: Family Medicine

## 2022-06-16 ENCOUNTER — Other Ambulatory Visit (HOSPITAL_COMMUNITY): Payer: Self-pay

## 2022-06-16 ENCOUNTER — Other Ambulatory Visit: Payer: Self-pay

## 2022-06-16 ENCOUNTER — Encounter: Payer: Self-pay | Admitting: Oncology

## 2022-06-16 MED ORDER — ZOLPIDEM TARTRATE ER 12.5 MG PO TBCR
12.5000 mg | EXTENDED_RELEASE_TABLET | Freq: Every day | ORAL | 1 refills | Status: DC
  Filled 2022-06-16: qty 90, 90d supply, fill #0
  Filled 2022-09-13: qty 90, 90d supply, fill #1

## 2022-06-16 NOTE — Telephone Encounter (Signed)
Name of Medication: Ambien Name of Pharmacy: [facility] Date and Quantity: 11/22/21 #90 tabs/ 1 refill Last Office Visit and Type: URI sxs1/3/24 (f/u on 05/10/22) Next Office Visit and Type: none scheduled

## 2022-06-19 ENCOUNTER — Other Ambulatory Visit: Payer: Self-pay

## 2022-06-29 ENCOUNTER — Other Ambulatory Visit (HOSPITAL_COMMUNITY): Payer: Self-pay

## 2022-07-06 ENCOUNTER — Other Ambulatory Visit (HOSPITAL_COMMUNITY): Payer: Self-pay

## 2022-07-20 ENCOUNTER — Other Ambulatory Visit (HOSPITAL_COMMUNITY): Payer: Self-pay

## 2022-07-20 ENCOUNTER — Other Ambulatory Visit: Payer: Self-pay

## 2022-07-24 ENCOUNTER — Other Ambulatory Visit (HOSPITAL_COMMUNITY): Payer: Self-pay

## 2022-07-24 ENCOUNTER — Encounter: Payer: Self-pay | Admitting: Oncology

## 2022-07-25 ENCOUNTER — Other Ambulatory Visit (HOSPITAL_COMMUNITY): Payer: Self-pay

## 2022-07-31 ENCOUNTER — Other Ambulatory Visit (HOSPITAL_COMMUNITY): Payer: Self-pay

## 2022-07-31 ENCOUNTER — Other Ambulatory Visit: Payer: Self-pay

## 2022-08-01 ENCOUNTER — Other Ambulatory Visit (HOSPITAL_COMMUNITY): Payer: Self-pay

## 2022-08-14 ENCOUNTER — Other Ambulatory Visit (HOSPITAL_COMMUNITY): Payer: Self-pay

## 2022-08-30 ENCOUNTER — Other Ambulatory Visit (HOSPITAL_COMMUNITY): Payer: Self-pay

## 2022-09-13 ENCOUNTER — Other Ambulatory Visit: Payer: Self-pay

## 2022-09-13 ENCOUNTER — Other Ambulatory Visit (HOSPITAL_COMMUNITY): Payer: Self-pay

## 2022-09-20 ENCOUNTER — Other Ambulatory Visit (HOSPITAL_COMMUNITY): Payer: Self-pay

## 2022-10-19 ENCOUNTER — Other Ambulatory Visit: Payer: Self-pay

## 2022-10-19 ENCOUNTER — Other Ambulatory Visit (HOSPITAL_COMMUNITY): Payer: Self-pay

## 2022-10-19 ENCOUNTER — Other Ambulatory Visit: Payer: Self-pay | Admitting: Family Medicine

## 2022-10-19 MED ORDER — PREMPRO 0.3-1.5 MG PO TABS
1.0000 | ORAL_TABLET | Freq: Every day | ORAL | 1 refills | Status: DC
  Filled 2022-10-19 – 2022-10-31 (×2): qty 84, 84d supply, fill #0
  Filled 2023-01-31: qty 84, 84d supply, fill #1

## 2022-10-20 ENCOUNTER — Other Ambulatory Visit: Payer: Self-pay

## 2022-10-31 ENCOUNTER — Encounter: Payer: Self-pay | Admitting: Family Medicine

## 2022-10-31 ENCOUNTER — Ambulatory Visit: Payer: 59 | Admitting: Family Medicine

## 2022-10-31 ENCOUNTER — Encounter: Payer: Self-pay | Admitting: Oncology

## 2022-10-31 ENCOUNTER — Other Ambulatory Visit: Payer: Self-pay

## 2022-10-31 ENCOUNTER — Other Ambulatory Visit (HOSPITAL_COMMUNITY): Payer: Self-pay

## 2022-10-31 VITALS — BP 130/82 | HR 74 | Temp 97.0°F | Ht 61.5 in | Wt 144.2 lb

## 2022-10-31 DIAGNOSIS — N6312 Unspecified lump in the right breast, upper inner quadrant: Secondary | ICD-10-CM | POA: Insufficient documentation

## 2022-10-31 NOTE — Patient Instructions (Signed)
We need to move forward to evaluate the area with a mammogram and [place].

## 2022-10-31 NOTE — Assessment & Plan Note (Addendum)
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  Concern for breast cancer. Recommend further evaluation with diagnostic mammogram and ultrasound ASAP. Last mammogram in 2022

## 2022-10-31 NOTE — Progress Notes (Signed)
Patient ID: Crystal Morris, female    DOB: 02/15/1966, 57 y.o.   MRN: [id number]  This visit was conducted in person.  BP 130/82 (BP Location: Left Arm, Patient Position: Sitting, Cuff Size: Normal)   Pulse 74   Temp (!) 97 F (36.1 C) (Temporal)   Ht 5' 1.5" (1.562 m)   Wt 144 lb 4 oz (65.4 kg)   SpO2 100%   BMI 26.81 kg/m    CC:  Chief Complaint  Patient presents with   Breast Mass    Right Breast    Subjective:   HPI: Crystal Morris is a 57 y.o. female patient of Dr. Royden Purl presenting on 10/31/2022 for Breast Mass (Right Breast)   She reports new onset breast mass in right breast.. first noted 1 week ago... awoke with bruising on right breast.  No known injury, no fall.  Noted lump in center of bruise.. no change in size over the week, quarter size.  Area is tender to touch. No nipple changes, no discharge.   She has been icing the area.   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   No personal history of family history of breat issue.   Mammogram 01/12/2021 scatter fibroglandular density.      Relevant past medical, surgical, family and social history reviewed and updated as indicated. Interim medical history since our last visit reviewed. Allergies and medications reviewed and updated.     Per HPI unless specifically indicated in ROS section below Review of Systems  Constitutional:  Negative for fatigue and fever.  HENT:  Negative for congestion.   Eyes:  Negative for pain.  Respiratory:  Negative for cough and shortness of breath.   Cardiovascular:  Negative for chest pain, palpitations and leg swelling.  Gastrointestinal:  Negative for abdominal pain.  Genitourinary:  Negative for dysuria and vaginal bleeding.  Musculoskeletal:  Negative for back pain.  Neurological:  Negative for syncope, light-headedness and headaches.  Psychiatric/Behavioral:  Negative for dysphoric mood.    Objective:  BP 130/82 (BP Location: Left Arm, Patient Position:  Sitting, Cuff Size: Normal)   Pulse 74   Temp (!) 97 F (36.1 C) (Temporal)   Ht 5' 1.5" (1.562 m)   Wt 144 lb 4 oz (65.4 kg)   SpO2 100%   BMI 26.81 kg/m   Wt Readings from Last 3 Encounters:  10/31/22 144 lb 4 oz (65.4 kg)  06/07/22 144 lb 3.2 oz (65.4 kg)  05/10/22 142 lb (64.4 kg)      Physical Exam Constitutional:      General: She is not in acute distress.    Appearance: Normal appearance. She is well-developed. She is not ill-appearing or toxic-appearing.  HENT:     Head: Normocephalic.     Right Ear: Hearing, tympanic membrane, ear canal and external ear normal. Tympanic membrane is not erythematous, retracted or bulging.     Left Ear: Hearing, tympanic membrane, ear canal and external ear normal. Tympanic membrane is not erythematous, retracted or bulging.     Nose: No mucosal edema or rhinorrhea.     Right Sinus: No maxillary sinus tenderness or frontal sinus tenderness.     Left Sinus: No maxillary sinus tenderness or frontal sinus tenderness.     Mouth/Throat:     Pharynx: Uvula midline.  Eyes:     General: Lids are normal. Lids are everted, no foreign bodies appreciated.     Conjunctiva/sclera: Conjunctivae normal.     Pupils: Pupils  are equal, round, and reactive to light.  Neck:     Thyroid: No thyroid mass or thyromegaly.     Vascular: No carotid bruit.     Trachea: Trachea normal.  Cardiovascular:     Rate and Rhythm: Normal rate and regular rhythm.     Pulses: Normal pulses.     Heart sounds: Normal heart sounds, S1 normal and S2 normal. No murmur heard.    No friction rub. No gallop.  Pulmonary:     Effort: Pulmonary effort is normal. No tachypnea or respiratory distress.     Breath sounds: Normal breath sounds. No decreased breath sounds, wheezing, rhonchi or rales.  Chest:  Breasts:    Right: Swelling, mass, skin change and tenderness present. No inverted nipple or nipple discharge.     Left: Normal.     Comments: right breast mass and bruising, no  known injury 3 oclock, 6 cm x 4 cm Abdominal:     General: Bowel sounds are normal.     Palpations: Abdomen is soft.     Tenderness: There is no abdominal tenderness.  Musculoskeletal:     Cervical back: Normal range of motion and neck supple.  Skin:    General: Skin is warm and dry.     Findings: No rash.  Neurological:     Mental Status: She is alert and oriented to person, place, and time.  Psychiatric:        Mood and Affect: Mood is not anxious or depressed.        Speech: Speech normal.        Behavior: Behavior normal. Behavior is cooperative.        Thought Content: Thought content normal.        Judgment: Judgment normal.       Results for orders placed or performed in visit on 06/07/22  POCT Influenza A/B  Result Value Ref Range   Influenza A, POC Negative Negative   Influenza B, POC Negative Negative  POC COVID-19  Result Value Ref Range   SARS Coronavirus 2 Ag Negative Negative    Assessment and Plan  Mass of upper inner quadrant of right breast Assessment & Plan: Acute, less likely fatty necrosis of the breast but unusual presentation as there was no known injury of breast tissue and changes occurred overnight. Recommend further evaluation with diagnostic mammogram and ultrasound ASAP. Last mammogram in 2022  Orders: -     [place] LIMITED ULTRASOUND INCLUDING AXILLA RIGHT BREAST; Future -     [place] LIMITED ULTRASOUND INCLUDING AXILLA LEFT BREAST ; Future -     MM 3D DIAGNOSTIC MAMMOGRAM BILATERAL BREAST; Future    No follow-ups on file.   Kerby Nora, MD

## 2022-11-16 ENCOUNTER — Other Ambulatory Visit: Payer: Self-pay | Admitting: Family Medicine

## 2022-11-16 DIAGNOSIS — N6312 Unspecified lump in the right breast, upper inner quadrant: Secondary | ICD-10-CM

## 2022-12-06 ENCOUNTER — Other Ambulatory Visit: Payer: Self-pay | Admitting: Family Medicine

## 2022-12-06 ENCOUNTER — Ambulatory Visit
Admission: RE | Admit: 2022-12-06 | Discharge: 2022-12-06 | Disposition: A | Discharge status: Home / Self Care | Payer: 59 | Source: Ambulatory Visit | Attending: Family Medicine | Admitting: Family Medicine

## 2022-12-06 DIAGNOSIS — N6312 Unspecified lump in the right breast, upper inner quadrant: Secondary | ICD-10-CM

## 2022-12-06 DIAGNOSIS — N644 Mastodynia: Secondary | ICD-10-CM | POA: Diagnosis not present

## 2022-12-06 DIAGNOSIS — R928 Other abnormal and inconclusive findings on diagnostic imaging of breast: Secondary | ICD-10-CM | POA: Diagnosis not present

## 2022-12-06 DIAGNOSIS — N641 Fat necrosis of breast: Secondary | ICD-10-CM

## 2022-12-14 ENCOUNTER — Other Ambulatory Visit (HOSPITAL_COMMUNITY): Payer: Self-pay

## 2022-12-14 ENCOUNTER — Other Ambulatory Visit: Payer: Self-pay | Admitting: Family Medicine

## 2022-12-15 MED ORDER — ZOLPIDEM TARTRATE ER 12.5 MG PO TBCR
12.5000 mg | EXTENDED_RELEASE_TABLET | Freq: Every day | ORAL | 1 refills | Status: DC
  Filled 2022-12-15: qty 90, 90d supply, fill #0
  Filled 2023-04-12: qty 90, 90d supply, fill #1

## 2022-12-15 NOTE — Telephone Encounter (Signed)
Name of Medication: Ambien Name of Pharmacy: [facility]  Last Fill or Written Date and Quantity: 06/16/22 #90 tabs/ 1 refill Last Office Visit and Type: acute appt with Dr. Ermalene Searing 10/31/22 Next Office Visit and Type: none scheduled

## 2022-12-16 ENCOUNTER — Other Ambulatory Visit (HOSPITAL_COMMUNITY): Payer: Self-pay

## 2023-01-01 ENCOUNTER — Other Ambulatory Visit (HOSPITAL_COMMUNITY): Payer: Self-pay

## 2023-01-12 ENCOUNTER — Ambulatory Visit: Payer: 59 | Admitting: Family Medicine

## 2023-01-12 VITALS — BP 135/80 | HR 83 | Temp 97.8°F | Ht 61.5 in | Wt 135.5 lb

## 2023-01-12 DIAGNOSIS — R52 Pain, unspecified: Secondary | ICD-10-CM | POA: Diagnosis not present

## 2023-01-12 DIAGNOSIS — J069 Acute upper respiratory infection, unspecified: Secondary | ICD-10-CM | POA: Insufficient documentation

## 2023-01-12 DIAGNOSIS — F39 Unspecified mood [affective] disorder: Secondary | ICD-10-CM | POA: Diagnosis not present

## 2023-01-12 LAB — POC COVID19 BINAXNOW: SARS Coronavirus 2 Ag: NEGATIVE

## 2023-01-12 NOTE — Progress Notes (Signed)
Subjective:    Patient ID: Crystal Morris, female    DOB: Mar 26, 1966, 57 y.o.   MRN: [id number]  HPI  Wt Readings from Last 3 Encounters:  01/12/23 135 lb 8 oz (61.5 kg)  10/31/22 144 lb 4 oz (65.4 kg)  06/07/22 144 lb 3.2 oz (65.4 kg)   25.19 kg/m  Vitals:   01/12/23 1150 01/12/23 1220  BP: (!) 146/82 135/80  Pulse: 83   Temp: 97.8 F (36.6 C)   SpO2: 99%     Pt presents with aches and fever/headache and fatigue   She does work with covid patients   Covid test is negative today   Symptoms started Tuesday  Body aches  Temp has been up to 102 degrees  Some sore throat (scratchy mild)  No ear pain  Some runny nose and congestion- clear mucous  Wet sounding cough / not producing  No wheezing or shortness of breath  Weak  Lethargic   Appetite ok  Not much sense of taste   Headache is more in sinuses  Midline-forehead     Over the counter :  Tylenol and ibuprofen     Tick bites - none  No rash   Results for orders placed or performed in visit on 01/12/23  POC COVID-19  Result Value Ref Range   SARS Coronavirus 2 Ag Negative Negative      Patient Active Problem List   Diagnosis Date Noted   Viral URI with cough 01/12/2023   Mass of upper inner quadrant of right breast 10/31/2022   GAD (generalized anxiety disorder) 04/07/2022   Elevated blood pressure reading without diagnosis of hypertension 04/07/2022   Elevated antinuclear antibody (ANA) level 10/03/2021   Elevated sed rate 10/03/2021   Low back pain 09/28/2021   Bilateral flank pain 09/28/2021   Multiple joint pain 09/28/2021   Abnormal urinalysis 09/28/2021   Dyspepsia 09/12/2021   DNR (do not resuscitate) discussion 09/12/2021   Routine general medical examination at a health care facility 10/22/2020   Urinary incontinence 10/22/2020   GERD (gastroesophageal reflux disease) 05/22/2019   Primary insomnia 10/18/2017   Frequent headaches 10/18/2017   Symptoms, such as flushing,  sleeplessness, headache, lack of concentration, associated with the menopause 10/18/2017   Iron deficiency anemia 09/09/2014   Constipation 12/13/2009   Vitamin D deficiency 12/13/2009   Anemia, unspecified 12/03/2009   Allergic rhinitis 12/01/2009   Attention or concentration deficit 12/01/2009   Migraine headache 12/01/2009   Past Medical History:  Diagnosis Date   Anemia    GERD (gastroesophageal reflux disease)    History of chicken pox    Medical history non-contributory    Wears glasses    Past Surgical History:  Procedure Laterality Date   CESAREAN SECTION     CLOSED REDUCTION METACARPAL WITH PERCUTANEOUS PINNING Left 07/28/2014   Procedure: CLOSED REDUCTION METACARPAL WITH PERCUTANEOUS PINNING PROXIMAL PHALANX FRACTURES LEFT MIDDLE RING AND SMALL FINGERS;  Surgeon: Betha Loa, MD;  Location: [facility];  Service: Orthopedics;  Laterality: Left;   COLONOSCOPY     2016   HEMORROIDECTOMY     INTRAUTERINE DEVICE INSERTION     Removed "a long time ago"   Social History   Tobacco Use   Smoking status: Never    Passive exposure: Current   Smokeless tobacco: Never  Vaping Use   Vaping status: Never Used  Substance Use Topics   Alcohol use: No   Drug use: No   Family History  Problem  Relation Age of Onset   Gout Mother    Rheum arthritis Mother    Rheum arthritis Father    Gout Father    Colon cancer Neg Hx    Esophageal cancer Neg Hx    Rectal cancer Neg Hx    Stomach cancer Neg Hx    Allergies  Allergen Reactions   Oxycodone Itching    Patient said that she doesn't have problems with it 05/13/2019   Current Outpatient Medications on File Prior to Visit  Medication Sig Dispense Refill   diphenhydrAMINE (BENADRYL) 25 MG tablet Take 25 mg by mouth 2 (two) times daily as needed for itching.     omeprazole (PRILOSEC) 40 MG capsule Take 1 capsule (40 mg total) by mouth 2 (two) times daily. 60 capsule 6   polyethylene glycol powder  (GLYCOLAX/MIRALAX) 17 GM/SCOOP powder Take 0.5 Containers by mouth daily as needed for moderate constipation or mild constipation.     PREMPRO 0.3-1.5 MG tablet Take 1 tablet by mouth daily. 84 tablet 1   zolpidem (AMBIEN CR) 12.5 MG CR tablet Take 1 tablet (12.5 mg total) by mouth at bedtime. 90 tablet 1   Current Facility-Administered Medications on File Prior to Visit  Medication Dose Route Frequency Provider Last Rate Last Admin   0.9 %  sodium chloride infusion  500 mL Intravenous Once Danis, Andreas Blower, MD        Review of Systems  Constitutional:  Positive for fatigue and fever. Negative for activity change, appetite change and unexpected weight change.  HENT:  Positive for congestion, postnasal drip, rhinorrhea and sinus pressure. Negative for ear pain, sore throat and trouble swallowing.        Throat is scratchy moreso than sore   Eyes:  Negative for pain, redness and visual disturbance.  Respiratory:  Positive for cough. Negative for shortness of breath, wheezing and stridor.   Cardiovascular:  Negative for chest pain and palpitations.  Gastrointestinal:  Negative for abdominal pain, blood in stool, constipation, diarrhea, nausea and vomiting.  Endocrine: Negative for polydipsia and polyuria.  Genitourinary:  Negative for dysuria, frequency and urgency.  Musculoskeletal:  Negative for arthralgias, back pain and myalgias.  Skin:  Negative for pallor and rash.  Allergic/Immunologic: Negative for environmental allergies.  Neurological:  Positive for headaches. Negative for dizziness and syncope.  Hematological:  Negative for adenopathy. Does not bruise/bleed easily.  Psychiatric/Behavioral:  Negative for decreased concentration and dysphoric mood. The patient is not nervous/anxious.        Objective:   Physical Exam Constitutional:      General: She is not in acute distress.    Appearance: Normal appearance. She is well-developed and normal weight. She is not ill-appearing,  toxic-appearing or diaphoretic.  HENT:     Head: Normocephalic and atraumatic.     Comments: Nares are injected and congested   No sinus tenderness    Right Ear: Tympanic membrane, ear canal and external ear normal.     Left Ear: Tympanic membrane, ear canal and external ear normal.     Nose: Congestion and rhinorrhea present.     Mouth/Throat:     Mouth: Mucous membranes are moist.     Pharynx: Oropharynx is clear. No oropharyngeal exudate or posterior oropharyngeal erythema.     Comments: Clear pnd  Eyes:     General:        Right eye: No discharge.        Left eye: No discharge.  Conjunctiva/sclera: Conjunctivae normal.     Pupils: Pupils are equal, round, and reactive to light.  Cardiovascular:     Rate and Rhythm: Normal rate.     Heart sounds: Normal heart sounds.  Pulmonary:     Effort: Pulmonary effort is normal. No respiratory distress.     Breath sounds: Normal breath sounds. No stridor. No wheezing, rhonchi or rales.     Comments: Good air exch Bs are slightly harsh  No wheeze even on forced expiration  Chest:     Chest wall: No tenderness.  Musculoskeletal:     Cervical back: Normal range of motion and neck supple.  Lymphadenopathy:     Cervical: No cervical adenopathy.  Skin:    General: Skin is warm and dry.     Capillary Refill: Capillary refill takes less than 2 seconds.     Findings: No rash.  Neurological:     Mental Status: She is alert.     Cranial Nerves: No cranial nerve deficit.  Psychiatric:        Mood and Affect: Mood normal.           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

## 2023-01-12 NOTE — Assessment & Plan Note (Signed)
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

## 2023-01-12 NOTE — Patient Instructions (Addendum)
Drink fluids and rest  ?mucinex DM is good for cough and congestion  ?Nasal saline for congestion as needed  ?Tylenol for fever or pain or headache  ?Please alert [place] if symptoms worsen (if severe or short of breath please go to the ER)  ? ?Update if not starting to improve in a week or if worsening   ?

## 2023-01-12 NOTE — Assessment & Plan Note (Signed)
Doing better  No longer taking lexapro

## 2023-01-31 ENCOUNTER — Other Ambulatory Visit (HOSPITAL_COMMUNITY): Payer: Self-pay

## 2023-02-02 ENCOUNTER — Other Ambulatory Visit: Payer: Self-pay

## 2023-02-02 ENCOUNTER — Encounter: Payer: Self-pay | Admitting: Family Medicine

## 2023-02-02 ENCOUNTER — Ambulatory Visit: Payer: 59 | Admitting: Family Medicine

## 2023-02-02 ENCOUNTER — Other Ambulatory Visit (HOSPITAL_COMMUNITY): Payer: Self-pay

## 2023-02-02 ENCOUNTER — Ambulatory Visit (INDEPENDENT_AMBULATORY_CARE_PROVIDER_SITE_OTHER)
Admission: RE | Admit: 2023-02-02 | Discharge: 2023-02-02 | Disposition: A | Discharge status: Home / Self Care | Payer: 59 | Source: Ambulatory Visit | Attending: Family Medicine | Admitting: Family Medicine

## 2023-02-02 VITALS — BP 138/68 | HR 71 | Temp 97.9°F | Ht 61.5 in | Wt 133.5 lb

## 2023-02-02 DIAGNOSIS — G8929 Other chronic pain: Secondary | ICD-10-CM

## 2023-02-02 DIAGNOSIS — M545 Low back pain, unspecified: Secondary | ICD-10-CM

## 2023-02-02 DIAGNOSIS — M5134 Other intervertebral disc degeneration, thoracic region: Secondary | ICD-10-CM | POA: Diagnosis not present

## 2023-02-02 DIAGNOSIS — M546 Pain in thoracic spine: Secondary | ICD-10-CM

## 2023-02-02 DIAGNOSIS — M50222 Other cervical disc displacement at C5-C6 level: Secondary | ICD-10-CM | POA: Diagnosis not present

## 2023-02-02 DIAGNOSIS — M419 Scoliosis, unspecified: Secondary | ICD-10-CM

## 2023-02-02 DIAGNOSIS — M4185 Other forms of scoliosis, thoracolumbar region: Secondary | ICD-10-CM | POA: Diagnosis not present

## 2023-02-02 MED ORDER — PREDNISONE 10 MG PO TABS
ORAL_TABLET | ORAL | 0 refills | Status: AC
  Filled 2023-02-02: qty 18, 9d supply, fill #0

## 2023-02-02 MED ORDER — METHOCARBAMOL 500 MG PO TABS
500.0000 mg | ORAL_TABLET | Freq: Every evening | ORAL | 1 refills | Status: DC | PRN
  Filled 2023-02-02: qty 30, 30d supply, fill #0

## 2023-02-02 NOTE — Progress Notes (Unsigned)
Subjective:    Patient ID: Crystal Morris, female    DOB: [birth year], 57 y.o.   MRN: [id number]  HPI  Wt Readings from Last 3 Encounters:  02/02/23 133 lb 8 oz (60.6 kg)  01/12/23 135 lb 8 oz (61.5 kg)  10/31/22 144 lb 4 oz (65.4 kg)   24.82 kg/m  Vitals:   02/02/23 0824  BP: 138/68  Pulse: 71  Temp: 97.9 F (36.6 C)  SpO2: 98%   Pt presents for worsening back pain   Low back  In the middle  Has some scoliosis  Both sharp and dull   No radiation to legs    No comfortable position or movement    Took prednisone in past- it helped   Working is getting harder    Over the counter  Orthopedic shoes Special chair cushion  Tries to stretch  Warm baths  Warm and cool compresses Ibuprofen Tylenol arthritis        Last LS film 09/2021   DG Lumbar Spine Complete (Accession [id number]) (Order [id number]) Imaging Date: 09/28/2021 Department: [facility] Ordering/Authorizing: Ohm Dentler, Audrie Gallus, MD   Exam Status  Status  Final [99]   PACS Intelerad Image Link   Show images for DG Lumbar Spine Complete Study Result  Narrative & Impression  CLINICAL DATA:  Bilateral low back pain since 2 weeks. No known injury.   EXAM: LUMBAR SPINE - COMPLETE five views   COMPARISON:  None.   FINDINGS: There is no evidence of lumbar spine fracture. Minimal dextroscoliosis of the lumbar spine. Intervertebral disc spaces are maintained.   IMPRESSION: Minimal dextroscoliosis of the lumbar spine. Otherwise the study is unremarkable.    Xr today DG Thoracic Spine 2 View  Result Date: 02/02/2023 CLINICAL DATA:  Lower thoracic and upper lumbar spine pain. EXAM: THORACIC SPINE 2 VIEWS COMPARISON:  None Available. FINDINGS: There are 12 rib-bearing thoracic vertebrae. Minimal thoracic levoscoliosis and minimal upper lumbar dextroscoliosis are noted. No significant listhesis is evident in the thoracic spine. Limited visualization of the  cervical spine demonstrates mild reversal of the normal cervical lordosis with minimal anterolisthesis of C4 on C5 and minimal retrolisthesis of C5 on C6 with disc degeneration at these levels. No fracture is identified. No age advanced degenerative changes are evident in the thoracic spine. The included portions of the lungs are grossly clear. IMPRESSION: 1. Minimal thoracolumbar scoliosis. 2. No acute osseous abnormality. Electronically Signed   By: Sebastian Ache M.D.   On: 02/02/2023 09:20      Patient Active Problem List   Diagnosis Date Noted   Scoliosis 02/02/2023   Mass of upper inner quadrant of right breast 10/31/2022   GAD (generalized anxiety disorder) 04/07/2022   Elevated blood pressure reading without diagnosis of hypertension 04/07/2022   Elevated antinuclear antibody (ANA) level 10/03/2021   Elevated sed rate 10/03/2021   Low back pain 09/28/2021   Bilateral flank pain 09/28/2021   Multiple joint pain 09/28/2021   Abnormal urinalysis 09/28/2021   Dyspepsia 09/12/2021   DNR (do not resuscitate) discussion 09/12/2021   Routine general medical examination at a health care facility 10/22/2020   Urinary incontinence 10/22/2020   GERD (gastroesophageal reflux disease) 05/22/2019   Primary insomnia 10/18/2017   Frequent headaches 10/18/2017   Symptoms, such as flushing, sleeplessness, headache, lack of concentration, associated with the menopause 10/18/2017   Iron deficiency anemia 09/09/2014   Constipation 12/13/2009   Vitamin D deficiency 12/13/2009   Anemia, unspecified  12/03/2009   Thoracic back pain 12/03/2009   Allergic rhinitis 12/01/2009   Attention or concentration deficit 12/01/2009   Migraine headache 12/01/2009   Past Medical History:  Diagnosis Date   Anemia    GERD (gastroesophageal reflux disease)    History of chicken pox    Medical history non-contributory    Wears glasses    Past Surgical History:  Procedure Laterality Date   CESAREAN SECTION      CLOSED REDUCTION METACARPAL WITH PERCUTANEOUS PINNING Left 07/28/2014   Procedure: CLOSED REDUCTION METACARPAL WITH PERCUTANEOUS PINNING PROXIMAL PHALANX FRACTURES LEFT MIDDLE RING AND SMALL FINGERS;  Surgeon: Betha Loa, MD;  Location: [facility];  Service: Orthopedics;  Laterality: Left;   COLONOSCOPY     2016   HEMORROIDECTOMY     INTRAUTERINE DEVICE INSERTION     Removed "a long time ago"   Social History   Tobacco Use   Smoking status: Never    Passive exposure: Current   Smokeless tobacco: Never  Vaping Use   Vaping status: Never Used  Substance Use Topics   Alcohol use: No   Drug use: No   Family History  Problem Relation Age of Onset   Gout Mother    Rheum arthritis Mother    Rheum arthritis Father    Gout Father    Colon cancer Neg Hx    Esophageal cancer Neg Hx    Rectal cancer Neg Hx    Stomach cancer Neg Hx    Allergies  Allergen Reactions   Oxycodone Itching    Patient said that she doesn't have problems with it 05/13/2019   Current Outpatient Medications on File Prior to Visit  Medication Sig Dispense Refill   diphenhydrAMINE (BENADRYL) 25 MG tablet Take 25 mg by mouth 2 (two) times daily as needed for itching.     omeprazole (PRILOSEC) 40 MG capsule Take 1 capsule (40 mg total) by mouth 2 (two) times daily. 60 capsule 6   polyethylene glycol powder (GLYCOLAX/MIRALAX) 17 GM/SCOOP powder Take 0.5 Containers by mouth daily as needed for moderate constipation or mild constipation.     PREMPRO 0.3-1.5 MG tablet Take 1 tablet by mouth daily. 84 tablet 1   zolpidem (AMBIEN CR) 12.5 MG CR tablet Take 1 tablet (12.5 mg total) by mouth at bedtime. 90 tablet 1   Current Facility-Administered Medications on File Prior to Visit  Medication Dose Route Frequency Provider Last Rate Last Admin   0.9 %  sodium chloride infusion  500 mL Intravenous Once Danis, Andreas Blower, MD        Review of Systems  Constitutional:  Positive for fatigue. Negative for  activity change, appetite change, fever and unexpected weight change.  HENT:  Negative for congestion, ear pain, rhinorrhea, sinus pressure and sore throat.   Eyes:  Negative for pain, redness and visual disturbance.  Respiratory:  Negative for cough, shortness of breath and wheezing.   Cardiovascular:  Negative for chest pain and palpitations.  Gastrointestinal:  Negative for abdominal pain, blood in stool, constipation and diarrhea.  Endocrine: Negative for polydipsia and polyuria.  Genitourinary:  Negative for dysuria, frequency and urgency.  Musculoskeletal:  Positive for back pain. Negative for arthralgias and myalgias.  Skin:  Negative for pallor and rash.  Allergic/Immunologic: Negative for environmental allergies.  Neurological:  Negative for dizziness, syncope and headaches.  Hematological:  Negative for adenopathy. Does not bruise/bleed easily.  Psychiatric/Behavioral:  Negative for decreased concentration and dysphoric mood. The patient is not  nervous/anxious.        Objective:   Physical Exam Constitutional:      General: She is not in acute distress.    Appearance: She is well-developed.  HENT:     Head: Normocephalic and atraumatic.  Eyes:     Conjunctiva/sclera: Conjunctivae normal.     Pupils: Pupils are equal, round, and reactive to light.  Neck:     Thyroid: No thyromegaly.     Vascular: No carotid bruit or JVD.  Cardiovascular:     Rate and Rhythm: Normal rate and regular rhythm.     Heart sounds: Normal heart sounds.     No gallop.  Pulmonary:     Effort: Pulmonary effort is normal. No respiratory distress.     Breath sounds: Normal breath sounds. No wheezing or rales.  Abdominal:     General: There is no distension or abdominal bruit.     Palpations: Abdomen is soft.  Musculoskeletal:     Cervical back: Normal range of motion and neck supple.     Thoracic back: Tenderness present. No swelling, edema, signs of trauma or bony tenderness. Normal range of  motion. Scoliosis present.     Lumbar back: Tenderness present. No bony tenderness. Decreased range of motion. Negative right straight leg raise test and negative left straight leg raise test. Scoliosis present.     Right lower leg: No edema.     Left lower leg: No edema.     Comments: Some muscular thoracic and lumbar tenderness with tightness   Limited flexion of LS Some pain with twisting   Lymphadenopathy:     Cervical: No cervical adenopathy.  Skin:    General: Skin is warm and dry.     Coloration: Skin is not pale.     Findings: No rash.  Neurological:     Mental Status: She is alert.     Coordination: Coordination normal.     Deep Tendon Reflexes: Reflexes are normal and symmetric. Reflexes normal.  Psychiatric:        Mood and Affect: Mood normal.           Assessment & Plan:   Problem List Items Addressed This Visit       Musculoskeletal and Integument   Scoliosis    No doubt adding to her msk back pain  Pt declines PT at this time Given handout with exercises to try        Other   Low back pain - Primary    Intermittent in setting of scoliosis  See a/p for TS pain       Relevant Medications   predniSONE (DELTASONE) 10 MG tablet   methocarbamol (ROBAXIN) 500 MG tablet   Other Relevant Orders   DG Thoracic Spine 2 View (Completed)   Thoracic back pain    In setting of scoliosis and intermittent low back pain  Is midline  Xray noted scoliosis/no acute changes  Declines PT / given rehab han[place]  Prednisone course/ discussed side effects Methocarbamol for bedtime prn Update if not starting to improve in a week or if worsening  Call back and Er precautions noted in detail today         Relevant Medications   predniSONE (DELTASONE) 10 MG tablet   methocarbamol (ROBAXIN) 500 MG tablet   Other Relevant Orders   DG Thoracic Spine 2 View (Completed)

## 2023-02-02 NOTE — Patient Instructions (Addendum)
Continue ice/ heat on back as needed  Take the prednisone as directed  Tylenol is ok  Try the muscle relaxer (methocarbamol) at bedtime   Try the exercises in the handout  When you are ready to do physical therapy let us know    Xray of your mid spine today  We will get back to you with results

## 2023-02-04 NOTE — Assessment & Plan Note (Signed)
No doubt adding to her msk back pain  Pt declines PT at this time Given handout with exercises to try

## 2023-02-04 NOTE — Assessment & Plan Note (Signed)
In setting of scoliosis and intermittent low back pain  Is midline  Xray noted scoliosis/no acute changes  Declines PT / given rehab hanod[place]  Prednisone course/ discussed side effects Methocarbamol for bedtime prn Update if not starting to improve in a week or if worsening  Call back and Er precautions noted in detail today

## 2023-02-04 NOTE — Assessment & Plan Note (Signed)
Intermittent in setting of scoliosis  See a/p for TS pain

## 2023-02-20 ENCOUNTER — Other Ambulatory Visit: Payer: 59

## 2023-02-23 ENCOUNTER — Other Ambulatory Visit: Payer: 59

## 2023-02-27 ENCOUNTER — Encounter: Payer: 59 | Admitting: Family Medicine

## 2023-03-02 ENCOUNTER — Encounter: Payer: 59 | Admitting: Family Medicine

## 2023-03-27 DIAGNOSIS — Z79899 Other long term (current) drug therapy: Secondary | ICD-10-CM | POA: Insufficient documentation

## 2023-03-27 NOTE — Progress Notes (Unsigned)
Subjective:    Patient ID: Crystal Morris, female    DOB: [birth year], 57 y.o.   MRN: [id number]  HPI  Here for health maintenance exam and to review chronic medical problems   Wt Readings from Last 3 Encounters:  03/28/23 126 lb (57.2 kg)  02/02/23 133 lb 8 oz (60.6 kg)  01/12/23 135 lb 8 oz (61.5 kg)   23.89 kg/m  Vitals:   03/28/23 0817  BP: 112/78  Pulse: 78  Temp: 97.8 F (36.6 C)  SpO2: 100%    Immunization History  Administered Date(s) Administered   Influenza,inj,Quad PF,6+ Mos 04/03/2018, 05/22/2019, 04/20/2020   Influenza,trivalent, recombinat, inj, PF 03/06/2015   PFIZER Comirnaty(Gray Top)Covid-19 Tri-Sucrose Vaccine 07/08/2019, 08/05/2019, 10/06/2020   Tdap 03/28/2023    Health Maintenance Due  Topic Date Due   HIV Screening  Never done   Hepatitis C Screening  Never done   Zoster Vaccines- Shingrix (1 of 2) Never done   Cervical Cancer Screening (HPV/Pap Cotest)  07/22/2019   Shingrix- wants to get later   Tetanus shot - wants to do today   Flu shot - had this season   Mammogram 12/2022  Self breast exam- no lumps   Gyn health- no problems  Pap 07/2016  Not sure sure when last pap was  No history of HPV or abn paps  Declines gyn exam today    Colon cancer screening  Colonoscopy 04/2019  Bone health  Falls- none  Fractures-none  Supplements  Last vitamin D Lab Results  Component Value Date   VD25OH 35.15 03/28/2023    Exercise :  No strength training  A lot of walking during the day     Mood    03/28/2023    8:26 AM 02/02/2023    8:31 AM 01/12/2023   12:00 PM 05/10/2022    8:55 AM 10/22/2020    5:18 PM  Depression screen PHQ 2/9  Decreased Interest 0 0 0 1 0  Down, Depressed, Hopeless 0 0 0 0 0  PHQ - 2 Score 0 0 0 1 0  Altered sleeping 0 0 0  0  Tired, decreased energy 0 0 0  0  Change in appetite 0 0 0  0  Feeling bad or failure about yourself  0 0 0  0  Trouble concentrating 0 0 0  0  Moving slowly or  fidgety/restless 0 0 0  0  Suicidal thoughts 0 0 0  0  PHQ-9 Score 0 0 0  0  Difficult doing work/chores Not difficult at all Not difficult at all Not difficult at all  Not difficult at all  Weaned off lexapro Doing well   GERD On omeprazole 40 mg bid = ran out of protonix and omeprazole  She stopped it herself  No problems now  Doing well  Sees GI   History of iron def anemia Lab Results  Component Value Date   WBC 4.4 03/28/2023   HGB 12.8 03/28/2023   HCT 41.9 03/28/2023   MCV 82.7 03/28/2023   PLT 272.0 03/28/2023       Patient Active Problem List   Diagnosis Date Noted   Scoliosis 02/02/2023   Mass of upper inner quadrant of right breast 10/31/2022   GAD (generalized anxiety disorder) 04/07/2022   Elevated antinuclear antibody (ANA) level 10/03/2021   Elevated sed rate 10/03/2021   Low back pain 09/28/2021   DNR (do not resuscitate) discussion 09/12/2021   Routine general medical examination at a health  care facility 10/22/2020   Urinary incontinence 10/22/2020   GERD (gastroesophageal reflux disease) 05/22/2019   Primary insomnia 10/18/2017   Iron deficiency anemia 09/09/2014   Vitamin D deficiency 12/13/2009   Thoracic back pain 12/03/2009   Allergic rhinitis 12/01/2009   Past Medical History:  Diagnosis Date   Anemia    GERD (gastroesophageal reflux disease)    History of chicken pox    Medical history non-contributory    Wears glasses    Past Surgical History:  Procedure Laterality Date   CESAREAN SECTION     CLOSED REDUCTION METACARPAL WITH PERCUTANEOUS PINNING Left 07/28/2014   Procedure: CLOSED REDUCTION METACARPAL WITH PERCUTANEOUS PINNING PROXIMAL PHALANX FRACTURES LEFT MIDDLE RING AND SMALL FINGERS;  Surgeon: Betha Loa, MD;  Location: [facility];  Service: Orthopedics;  Laterality: Left;   COLONOSCOPY     2016   HEMORROIDECTOMY     INTRAUTERINE DEVICE INSERTION     Removed "a long time ago"   Social History   Tobacco  Use   Smoking status: Never    Passive exposure: Current   Smokeless tobacco: Never  Vaping Use   Vaping status: Never Used  Substance Use Topics   Alcohol use: No   Drug use: No   Family History  Problem Relation Age of Onset   Gout Mother    Rheum arthritis Mother    Rheum arthritis Father    Gout Father    Colon cancer Neg Hx    Esophageal cancer Neg Hx    Rectal cancer Neg Hx    Stomach cancer Neg Hx    Allergies  Allergen Reactions   Oxycodone Itching    Patient said that she doesn't have problems with it 05/13/2019   Current Outpatient Medications on File Prior to Visit  Medication Sig Dispense Refill   diphenhydrAMINE (BENADRYL) 25 MG tablet Take 25 mg by mouth 2 (two) times daily as needed for itching.     methocarbamol (ROBAXIN) 500 MG tablet Take 1 tablet (500 mg total) by mouth at bedtime as needed for muscle spasms. 30 tablet 1   omeprazole (PRILOSEC) 40 MG capsule Take 1 capsule (40 mg total) by mouth 2 (two) times daily. 60 capsule 6   polyethylene glycol powder (GLYCOLAX/MIRALAX) 17 GM/SCOOP powder Take 0.5 Containers by mouth daily as needed for moderate constipation or mild constipation.     PREMPRO 0.3-1.5 MG tablet Take 1 tablet by mouth daily. 84 tablet 1   zolpidem (AMBIEN CR) 12.5 MG CR tablet Take 1 tablet (12.5 mg total) by mouth at bedtime. 90 tablet 1   No current facility-administered medications on file prior to visit.    Review of Systems  Constitutional:  Negative for activity change, appetite change, fatigue, fever and unexpected weight change.  HENT:  Negative for congestion, ear pain, rhinorrhea, sinus pressure and sore throat.   Eyes:  Negative for pain, redness and visual disturbance.  Respiratory:  Negative for cough, shortness of breath and wheezing.   Cardiovascular:  Negative for chest pain and palpitations.  Gastrointestinal:  Negative for abdominal pain, blood in stool, constipation and diarrhea.  Endocrine: Negative for polydipsia  and polyuria.  Genitourinary:  Negative for dysuria, frequency and urgency.  Musculoskeletal:  Negative for arthralgias, back pain and myalgias.  Skin:  Negative for pallor and rash.  Allergic/Immunologic: Negative for environmental allergies.  Neurological:  Negative for dizziness, syncope and headaches.  Hematological:  Negative for adenopathy. Does not bruise/bleed easily.  Psychiatric/Behavioral:  Negative for  decreased concentration and dysphoric mood. The patient is not nervous/anxious.        Objective:   Physical Exam Constitutional:      General: She is not in acute distress.    Appearance: Normal appearance. She is well-developed and normal weight. She is not ill-appearing or diaphoretic.  HENT:     Head: Normocephalic and atraumatic.     Right Ear: Tympanic membrane, ear canal and external ear normal.     Left Ear: Tympanic membrane, ear canal and external ear normal.     Nose: Nose normal. No congestion.     Mouth/Throat:     Mouth: Mucous membranes are moist.     Pharynx: Oropharynx is clear. No posterior oropharyngeal erythema.  Eyes:     General: No scleral icterus.    Extraocular Movements: Extraocular movements intact.     Conjunctiva/sclera: Conjunctivae normal.     Pupils: Pupils are equal, round, and reactive to light.  Neck:     Thyroid: No thyromegaly.     Vascular: No carotid bruit or JVD.  Cardiovascular:     Rate and Rhythm: Normal rate and regular rhythm.     Pulses: Normal pulses.     Heart sounds: Normal heart sounds.     No gallop.  Pulmonary:     Effort: Pulmonary effort is normal. No respiratory distress.     Breath sounds: Normal breath sounds. No wheezing.     Comments: Good air exch Chest:     Chest wall: No tenderness.  Abdominal:     General: Bowel sounds are normal. There is no distension or abdominal bruit.     Palpations: Abdomen is soft. There is no mass.     Tenderness: There is no abdominal tenderness.     Hernia: No hernia is  present.  Genitourinary:    Comments: Breast exam: No mass, nodules, thickening, tenderness, bulging, retraction, inflamation, nipple discharge or skin changes noted.  No axillary or clavicular LA.     Musculoskeletal:        General: No tenderness. Normal range of motion.     Cervical back: Normal range of motion and neck supple. No rigidity. No muscular tenderness.     Right lower leg: No edema.     Left lower leg: No edema.     Comments: No kyphosis  Mild scoliosis   Lymphadenopathy:     Cervical: No cervical adenopathy.  Skin:    General: Skin is warm and dry.     Coloration: Skin is not pale.     Findings: No erythema or rash.     Comments: Some scattered lentigines   Neurological:     Mental Status: She is alert. Mental status is at baseline.     Cranial Nerves: No cranial nerve deficit.     Motor: No abnormal muscle tone.     Coordination: Coordination normal.     Gait: Gait normal.     Deep Tendon Reflexes: Reflexes are normal and symmetric. Reflexes normal.  Psychiatric:        Mood and Affect: Mood normal.        Cognition and Memory: Cognition and memory normal.           Assessment & Plan:   Problem List Items Addressed This Visit       Digestive   GERD (gastroesophageal reflux disease)    Stopped ppi and no reported symptoms         Other   Iron deficiency  anemia    Labs today  Has seen GI  On ppi in past      Relevant Orders   CBC with Differential/Platelet (Completed)   Iron (Completed)   Primary insomnia    Takes ambien cr 12.5 mg with caution       Routine general medical examination at a health care facility - Primary    Reviewed health habits including diet and exercise and skin cancer prevention Reviewed appropriate screening tests for age  Also reviewed health mt list, fam hx and immunization status , as well as social and family history   See HPI Labs reviewed and ordered Plans to get shingrix later  Flu shot today  Mammo utd  12/2022 Declines pap/gyn exam  Colonoscopy 04/2019 Discussed fall prevention, supplements and exercise for bone density  PHQ 0       Relevant Orders   TSH (Completed)   Lipid panel (Completed)   Comprehensive metabolic panel (Completed)   CBC with Differential/Platelet (Completed)   Vitamin D deficiency    D level today  Discussed importance to bone and overall health  Plans to get back on it      Relevant Orders   VITAMIN D 25 Hydroxy (Vit-D Deficiency, Fractures) (Completed)   Other Visit Diagnoses     Need for Tdap vaccination       Relevant Orders   Tdap vaccine greater than or equal to 7yo IM (Completed)

## 2023-03-28 ENCOUNTER — Ambulatory Visit (INDEPENDENT_AMBULATORY_CARE_PROVIDER_SITE_OTHER): Payer: 59 | Admitting: Family Medicine

## 2023-03-28 VITALS — BP 112/78 | HR 78 | Temp 97.8°F | Ht 60.9 in | Wt 126.0 lb

## 2023-03-28 DIAGNOSIS — D649 Anemia, unspecified: Secondary | ICD-10-CM

## 2023-03-28 DIAGNOSIS — K219 Gastro-esophageal reflux disease without esophagitis: Secondary | ICD-10-CM | POA: Diagnosis not present

## 2023-03-28 DIAGNOSIS — E559 Vitamin D deficiency, unspecified: Secondary | ICD-10-CM | POA: Diagnosis not present

## 2023-03-28 DIAGNOSIS — Z23 Encounter for immunization: Secondary | ICD-10-CM | POA: Diagnosis not present

## 2023-03-28 DIAGNOSIS — Z0000 Encounter for general adult medical examination without abnormal findings: Secondary | ICD-10-CM | POA: Diagnosis not present

## 2023-03-28 DIAGNOSIS — F5101 Primary insomnia: Secondary | ICD-10-CM

## 2023-03-28 DIAGNOSIS — Z79899 Other long term (current) drug therapy: Secondary | ICD-10-CM

## 2023-03-28 DIAGNOSIS — D508 Other iron deficiency anemias: Secondary | ICD-10-CM

## 2023-03-28 LAB — COMPREHENSIVE METABOLIC PANEL
ALT: 9 U/L (ref 0–35)
AST: 16 U/L (ref 0–37)
Albumin: 4.6 g/dL (ref 3.5–5.2)
Alkaline Phosphatase: 36 U/L — ABNORMAL LOW (ref 39–117)
BUN: 16 mg/dL (ref 6–23)
CO2: 28 meq/L (ref 19–32)
Calcium: 10.1 mg/dL (ref 8.4–10.5)
Chloride: 101 meq/L (ref 96–112)
Creatinine, Ser: 0.86 mg/dL (ref 0.40–1.20)
GFR: 74.52 mL/min (ref >60.00)
Glucose, Bld: 75 mg/dL (ref 70–99)
Potassium: 4.1 meq/L (ref 3.5–5.1)
Sodium: 138 meq/L (ref 135–145)
Total Bilirubin: 0.5 mg/dL (ref 0.2–1.2)
Total Protein: 7.6 g/dL (ref 6.0–8.3)

## 2023-03-28 LAB — LIPID PANEL
Cholesterol: 168 mg/dL (ref 0–200)
HDL: 56.7 mg/dL (ref >39.00)
LDL Cholesterol: 99 mg/dL (ref 0–99)
NonHDL: 111.05
Total CHOL/HDL Ratio: 3
Triglycerides: 59 mg/dL (ref 0.0–149.0)
VLDL: 11.8 mg/dL (ref 0.0–40.0)

## 2023-03-28 LAB — CBC WITH DIFFERENTIAL/PLATELET
Basophils Absolute: 0 10*3/uL (ref 0.0–0.1)
Basophils Relative: 0.3 % (ref 0.0–3.0)
Eosinophils Absolute: 0 10*3/uL (ref 0.0–0.7)
Eosinophils Relative: 0.5 % (ref 0.0–5.0)
HCT: 41.9 % (ref 36.0–46.0)
Hemoglobin: 12.8 g/dL (ref 12.0–15.0)
Lymphocytes Relative: 42.9 % (ref 12.0–46.0)
Lymphs Abs: 1.9 10*3/uL (ref 0.7–4.0)
MCHC: 30.5 g/dL (ref 30.0–36.0)
MCV: 82.7 fL (ref 78.0–100.0)
Monocytes Absolute: 0.4 10*3/uL (ref 0.1–1.0)
Monocytes Relative: 9.9 % (ref 3.0–12.0)
Neutro Abs: 2 10*3/uL (ref 1.4–7.7)
Neutrophils Relative %: 46.4 % (ref 43.0–77.0)
Platelets: 272 10*3/uL (ref 150.0–400.0)
RBC: 5.07 Mil/uL (ref 3.87–5.11)
RDW: 14.3 % (ref 11.5–15.5)
WBC: 4.4 10*3/uL (ref 4.0–10.5)

## 2023-03-28 LAB — IRON: Iron: 103 ug/dL (ref 42–145)

## 2023-03-28 LAB — TSH: TSH: 1.77 u[IU]/mL (ref 0.35–5.50)

## 2023-03-28 LAB — VITAMIN D 25 HYDROXY (VIT D DEFICIENCY, FRACTURES): VITD: 35.15 ng/mL (ref 30.00–100.00)

## 2023-03-28 NOTE — Assessment & Plan Note (Addendum)
Labs today  Has seen GI  On ppi in past

## 2023-03-28 NOTE — Assessment & Plan Note (Signed)
Takes ambien cr 12.5 mg with caution

## 2023-03-28 NOTE — Assessment & Plan Note (Signed)
D level today  Discussed importance to bone and overall health  Plans to get back on it

## 2023-03-28 NOTE — Assessment & Plan Note (Signed)
Stopped ppi and no reported symptoms

## 2023-03-28 NOTE — Assessment & Plan Note (Signed)
Reviewed health habits including diet and exercise and skin cancer prevention Reviewed appropriate screening tests for age  Also reviewed health mt list, fam hx and immunization status , as well as social and family history   See HPI Labs reviewed and ordered Plans to get shingrix later  Flu shot today  Mammo utd 12/2022 Declines pap/gyn exam  Colonoscopy 04/2019 Discussed fall prevention, supplements and exercise for bone density  PHQ 0

## 2023-03-28 NOTE — Patient Instructions (Addendum)
If you are interested in the shingles vaccine series (Shingrix), call your insurance or pharmacy to check on coverage and location it must be given.  If affordable - you can schedule it here or at your pharmacy depending on coverage   Tetanus shot today   Try to get 1200 of calcium per day with at least 2000 iu of vitamin D - for bone health (divide that twice daily)  If calcium constipates you , just take the D    Exercise is very important  Add some strength training to your routine, this is important for bone and brain health and can reduce your risk of falls and help your body use insulin properly and regulate weight  Light weights, exercise bands , and internet videos are a good way to start  Yoga (chair or regular), machines , floor exercises or a gym with machines are also good options    Labs today

## 2023-04-11 NOTE — Progress Notes (Deleted)
Office Visit Note  Patient: Crystal Morris             Date of Birth: [birth year]           MRN: [id number]             PCP: Judy Pimple, MD Referring: Tower, Audrie Gallus, MD Visit Date: 04/25/2023 Occupation: @GUAROCC @  Subjective:  No chief complaint on file.   History of Present Illness: Crystal Morris is a 57 y.o. female ***     Activities of Daily Living:  Patient reports morning stiffness for *** {minute/hour:19697}.   Patient {ACTIONS;DENIES/REPORTS:21021675::"Denies"} nocturnal pain.  Difficulty dressing/grooming: {ACTIONS;DENIES/REPORTS:21021675::"Denies"} Difficulty climbing stairs: {ACTIONS;DENIES/REPORTS:21021675::"Denies"} Difficulty getting out of chair: {ACTIONS;DENIES/REPORTS:21021675::"Denies"} Difficulty using hands for taps, buttons, cutlery, and/or writing: {ACTIONS;DENIES/REPORTS:21021675::"Denies"}  No Rheumatology ROS completed.   PMFS History:  Patient Active Problem List   Diagnosis Date Noted   Scoliosis 02/02/2023   Mass of upper inner quadrant of right breast 10/31/2022   GAD (generalized anxiety disorder) 04/07/2022   Elevated antinuclear antibody (ANA) level 10/03/2021   Elevated sed rate 10/03/2021   Low back pain 09/28/2021   DNR (do not resuscitate) discussion 09/12/2021   Routine general medical examination at a health care facility 10/22/2020   Urinary incontinence 10/22/2020   GERD (gastroesophageal reflux disease) 05/22/2019   Primary insomnia 10/18/2017   Iron deficiency anemia 09/09/2014   Vitamin D deficiency 12/13/2009   Thoracic back pain 12/03/2009   Allergic rhinitis 12/01/2009    Past Medical History:  Diagnosis Date   Anemia    GERD (gastroesophageal reflux disease)    History of chicken pox    Medical history non-contributory    Wears glasses     Family History  Problem Relation Age of Onset   Gout Mother    Rheum arthritis Mother    Rheum arthritis Father    Gout Father    Colon cancer Neg Hx    Esophageal  cancer Neg Hx    Rectal cancer Neg Hx    Stomach cancer Neg Hx    Past Surgical History:  Procedure Laterality Date   CESAREAN SECTION     CLOSED REDUCTION METACARPAL WITH PERCUTANEOUS PINNING Left 07/28/2014   Procedure: CLOSED REDUCTION METACARPAL WITH PERCUTANEOUS PINNING PROXIMAL PHALANX FRACTURES LEFT MIDDLE RING AND SMALL FINGERS;  Surgeon: Betha Loa, MD;  Location: [facility];  Service: Orthopedics;  Laterality: Left;   COLONOSCOPY     2016   HEMORROIDECTOMY     INTRAUTERINE DEVICE INSERTION     Removed "a long time ago"   Social History   Social History Narrative   Not on file   Immunization History  Administered Date(s) Administered   Influenza,inj,Quad PF,6+ Mos 04/03/2018, 05/22/2019, 04/20/2020   Influenza,trivalent, recombinat, inj, PF 03/06/2015   PFIZER Comirnaty(Gray Top)Covid-19 Tri-Sucrose Vaccine 07/08/2019, 08/05/2019, 10/06/2020   Tdap 03/28/2023     Objective: Vital Signs: There were no vitals taken for this visit.   Physical Exam   Musculoskeletal Exam: ***  CDAI Exam: CDAI Score: -- Patient Global: --; Provider Global: -- Swollen: --; Tender: -- Joint Exam 04/25/2023   No joint exam has been documented for this visit   There is currently no information documented on the homunculus. Go to the Rheumatology activity and complete the homunculus joint exam.  Investigation: No additional findings.  Imaging: No results found.  Recent Labs: Lab Results  Component Value Date   WBC 4.4 03/28/2023   HGB 12.8 03/28/2023   PLT  272.0 03/28/2023   NA 138 03/28/2023   K 4.1 03/28/2023   CL 101 03/28/2023   CO2 28 03/28/2023   GLUCOSE 75 03/28/2023   BUN 16 03/28/2023   CREATININE 0.86 03/28/2023   BILITOT 0.5 03/28/2023   ALKPHOS 36 (L) 03/28/2023   AST 16 03/28/2023   ALT 9 03/28/2023   PROT 7.6 03/28/2023   ALBUMIN 4.6 03/28/2023   CALCIUM 10.1 03/28/2023   GFRAA 59 (L) 03/28/2019    Speciality Comments: No specialty  comments available.  Procedures:  No procedures performed Allergies: Oxycodone   Assessment / Plan:     Visit Diagnoses: No diagnosis found.  Orders: No orders of the defined types were placed in this encounter.  No orders of the defined types were placed in this encounter.   Face-to-face time spent with patient was *** minutes. Greater than 50% of time was spent in counseling and coordination of care.  Follow-Up Instructions: No follow-ups on file.   Ellen Henri, CMA  Note - This record has been created using Animal nutritionist.  Chart creation errors have been sought, but may not always  have been located. Such creation errors do not reflect on  the standard of medical care.

## 2023-04-12 ENCOUNTER — Other Ambulatory Visit: Payer: Self-pay

## 2023-04-12 ENCOUNTER — Other Ambulatory Visit (HOSPITAL_COMMUNITY): Payer: Self-pay

## 2023-04-12 ENCOUNTER — Other Ambulatory Visit: Payer: Self-pay | Admitting: Family Medicine

## 2023-04-12 MED ORDER — PREMPRO 0.3-1.5 MG PO TABS
1.0000 | ORAL_TABLET | Freq: Every day | ORAL | 2 refills | Status: DC
  Filled 2023-04-12: qty 84, 84d supply, fill #0
  Filled 2023-09-25: qty 84, 84d supply, fill #1
  Filled 2024-02-18: qty 84, 84d supply, fill #2

## 2023-04-25 ENCOUNTER — Ambulatory Visit: Payer: Self-pay | Admitting: Rheumatology

## 2023-04-25 DIAGNOSIS — M2550 Pain in unspecified joint: Secondary | ICD-10-CM

## 2023-04-25 DIAGNOSIS — R030 Elevated blood-pressure reading, without diagnosis of hypertension: Secondary | ICD-10-CM

## 2023-04-25 DIAGNOSIS — G8929 Other chronic pain: Secondary | ICD-10-CM

## 2023-04-25 DIAGNOSIS — Z8669 Personal history of other diseases of the nervous system and sense organs: Secondary | ICD-10-CM

## 2023-04-25 DIAGNOSIS — E559 Vitamin D deficiency, unspecified: Secondary | ICD-10-CM

## 2023-04-25 DIAGNOSIS — K219 Gastro-esophageal reflux disease without esophagitis: Secondary | ICD-10-CM

## 2023-04-25 DIAGNOSIS — R700 Elevated erythrocyte sedimentation rate: Secondary | ICD-10-CM

## 2023-04-25 DIAGNOSIS — M791 Myalgia, unspecified site: Secondary | ICD-10-CM

## 2023-04-25 DIAGNOSIS — R5383 Other fatigue: Secondary | ICD-10-CM

## 2023-06-08 ENCOUNTER — Other Ambulatory Visit (HOSPITAL_BASED_OUTPATIENT_CLINIC_OR_DEPARTMENT_OTHER): Payer: Self-pay

## 2023-06-08 ENCOUNTER — Other Ambulatory Visit (HOSPITAL_COMMUNITY): Payer: Self-pay

## 2023-06-09 ENCOUNTER — Other Ambulatory Visit (HOSPITAL_COMMUNITY): Payer: Self-pay

## 2023-06-10 ENCOUNTER — Other Ambulatory Visit (HOSPITAL_COMMUNITY): Payer: Self-pay

## 2023-06-12 ENCOUNTER — Other Ambulatory Visit (HOSPITAL_COMMUNITY): Payer: Self-pay

## 2023-06-13 ENCOUNTER — Other Ambulatory Visit (HOSPITAL_COMMUNITY): Payer: Self-pay

## 2023-06-13 MED ORDER — TRANEXAMIC ACID 650 MG PO TABS
ORAL_TABLET | ORAL | 2 refills | Status: DC
  Filled 2023-06-13 – 2023-06-28 (×3): qty 30, 30d supply, fill #0

## 2023-06-14 ENCOUNTER — Other Ambulatory Visit: Payer: Self-pay

## 2023-06-18 ENCOUNTER — Other Ambulatory Visit: Payer: Self-pay

## 2023-06-18 ENCOUNTER — Other Ambulatory Visit (HOSPITAL_COMMUNITY): Payer: Self-pay

## 2023-06-19 ENCOUNTER — Other Ambulatory Visit: Payer: Self-pay

## 2023-06-21 ENCOUNTER — Other Ambulatory Visit: Payer: Self-pay

## 2023-06-28 ENCOUNTER — Other Ambulatory Visit: Payer: Self-pay | Admitting: Family Medicine

## 2023-06-28 ENCOUNTER — Encounter (HOSPITAL_COMMUNITY): Payer: Self-pay | Admitting: Pharmacist

## 2023-06-28 ENCOUNTER — Other Ambulatory Visit: Payer: Self-pay

## 2023-06-28 ENCOUNTER — Other Ambulatory Visit (HOSPITAL_COMMUNITY): Payer: Self-pay

## 2023-06-28 MED ORDER — ZOLPIDEM TARTRATE ER 12.5 MG PO TBCR
12.5000 mg | EXTENDED_RELEASE_TABLET | Freq: Every day | ORAL | 1 refills | Status: DC
  Filled 2023-06-28 – 2023-07-09 (×2): qty 90, 90d supply, fill #0
  Filled 2023-10-25: qty 90, 90d supply, fill #1

## 2023-06-28 NOTE — Telephone Encounter (Signed)
Name of Medication: Ambien Name of Pharmacy: [facility]  Last Fill or Written Date and Quantity: 12/15/22 #90 tabs/ 1 refill Last Office Visit and Type: CPE 03/28/23 Next Office Visit and Type: none scheduled

## 2023-06-29 ENCOUNTER — Other Ambulatory Visit (HOSPITAL_COMMUNITY): Payer: Self-pay

## 2023-07-09 ENCOUNTER — Other Ambulatory Visit: Payer: Self-pay

## 2023-08-16 ENCOUNTER — Ambulatory Visit: Payer: Self-pay | Admitting: Family Medicine

## 2023-08-16 NOTE — Telephone Encounter (Signed)
 We can not give med without seeing pt, please schedule an appt tomorrow Fulton Mole, NP) has a lot of opening for tomorrow.

## 2023-08-16 NOTE — Telephone Encounter (Signed)
 Copied from CRM [id number]. Topic: Clinical - Red Word Triage >> Aug 16, 2023  9:16 AM Sim Boast F wrote: Red Word that prompted transfer to Nurse Triage: Patient says her chest is hurting, she has a headache and bad cough   Chief Complaint: dry cough Symptoms: body aches, sinus headache, runny nose, nausea, vomiting, chest pain when coughing Frequency: ongoing since Saturday Pertinent Negatives: Patient denies shortness of breath Disposition: [] ED /[] Urgent Care (no appt availability in office) / [x] Appointment(In office/virtual)/ []  [facility]/ [] Home Care/ [x] Refused Recommended Disposition /[] [facility]/ []  Follow-up with PCP Additional Notes:  The patient reported that beginning Saturday she has had a dry cough, chest pain with coughing, a runny nose, the cough causes nausea and vomiting, a sinus headache, body aches, and a temperature of 99-100.  She is requesting a z-pak and prescription cough medicine as the over the counter medication that she has been taking is unhelpful  She has taken Mucinex DM, Sudafed, tylenol and ibuprofen.  She is also requesting a work note.  Advised her that an office visit is needed.  She was agreeable to come to her normal pcp's office to see anyone available however, there is no availability until tomorrow morning.  She stated that she cannot wait until tomorrow.  She declined to go to another pcp's office or do an urgent care virtual visit.  Routed to pcp for treatment recommendations and possible availability.  Reason for Disposition  SEVERE coughing spells (e.g., whooping sound after coughing, vomiting after coughing)  Answer Assessment - Initial Assessment Questions 1. ONSET: "When did the cough begin?"      Saturday  2. SEVERITY: "How bad is the cough today?"      Constant  3. SPUTUM: "Describe the color of your sputum" (none, dry cough; clear, white, yellow, green)     Dry  4. HEMOPTYSIS: "Are you coughing up any blood?" If so  ask: "How much?" (flecks, streaks, tablespoons, etc.)     None  5. DIFFICULTY BREATHING: "Are you having difficulty breathing?" If Yes, ask: "How bad is it?" (e.g., mild, moderate, severe)    - MILD: No SOB at rest, mild SOB with walking, speaks normally in sentences, can lie down, no retractions, pulse < 100.    - MODERATE: SOB at rest, SOB with minimal exertion and prefers to sit, cannot lie down flat, speaks in phrases, mild retractions, audible wheezing, pulse 100-120.    - SEVERE: Very SOB at rest, speaks in single words, struggling to breathe, sitting hunched forward, retractions, pulse > 120      None  6. FEVER: "Do you have a fever?" If Yes, ask: "What is your temperature, how was it measured, and when did it start?"     99-100 7. CARDIAC HISTORY: "Do you have any history of heart disease?" (e.g., heart attack, congestive heart failure)      None  8. LUNG HISTORY: "Do you have any history of lung disease?"  (e.g., pulmonary embolus, asthma, emphysema)     None  10. OTHER SYMPTOMS: "Do you have any other symptoms?" (e.g., runny nose, wheezing, chest pain)     Runny nose  Chest pain with coughing Nausea/vomiting Body aches Sinus headache  Protocols used: Cough - Acute Non-Productive-A-AH

## 2023-08-17 ENCOUNTER — Ambulatory Visit: Admitting: Family

## 2023-09-25 ENCOUNTER — Other Ambulatory Visit (HOSPITAL_COMMUNITY): Payer: Self-pay

## 2023-10-25 ENCOUNTER — Other Ambulatory Visit (HOSPITAL_COMMUNITY): Payer: Self-pay

## 2023-10-25 ENCOUNTER — Other Ambulatory Visit: Payer: Self-pay

## 2024-02-05 ENCOUNTER — Other Ambulatory Visit: Payer: Self-pay

## 2024-02-05 ENCOUNTER — Other Ambulatory Visit: Payer: Self-pay | Admitting: Family Medicine

## 2024-02-05 ENCOUNTER — Other Ambulatory Visit (HOSPITAL_COMMUNITY): Payer: Self-pay

## 2024-02-07 ENCOUNTER — Other Ambulatory Visit (HOSPITAL_COMMUNITY): Payer: Self-pay

## 2024-02-07 MED ORDER — ZOLPIDEM TARTRATE ER 12.5 MG PO TBCR
12.5000 mg | EXTENDED_RELEASE_TABLET | Freq: Every day | ORAL | 0 refills | Status: DC
  Filled 2024-02-07: qty 90, 90d supply, fill #0

## 2024-02-07 NOTE — Telephone Encounter (Signed)
 Name of Medication: Ambien  Name of Pharmacy: [facility]  Last [facility] or Written Date and Quantity: 06/28/23 #90 tabs/ 1 refill Last Office Visit and Type: CPE 03/28/23 Next Office Visit and Type: none scheduled

## 2024-02-07 NOTE — Telephone Encounter (Signed)
 Please schedule annual exam in November

## 2024-02-08 NOTE — Telephone Encounter (Signed)
 I called and patient has been scheduled.

## 2024-02-18 ENCOUNTER — Other Ambulatory Visit (HOSPITAL_COMMUNITY): Payer: Self-pay

## 2024-03-30 ENCOUNTER — Telehealth: Payer: Self-pay | Admitting: Family Medicine

## 2024-03-30 DIAGNOSIS — E559 Vitamin D deficiency, unspecified: Secondary | ICD-10-CM

## 2024-03-30 DIAGNOSIS — Z79899 Other long term (current) drug therapy: Secondary | ICD-10-CM

## 2024-03-30 DIAGNOSIS — D508 Other iron deficiency anemias: Secondary | ICD-10-CM

## 2024-03-30 DIAGNOSIS — Z0000 Encounter for general adult medical examination without abnormal findings: Secondary | ICD-10-CM

## 2024-03-30 NOTE — Telephone Encounter (Signed)
-----   Message from Veva JINNY Ferrari sent at 03/18/2024  3:32 PM EDT ----- Regarding: Lab orders forTue, 10.28.25 Patient is scheduled for CPX labs, please order future labs, Thanks , Veva

## 2024-04-01 ENCOUNTER — Ambulatory Visit: Payer: Self-pay | Admitting: Family Medicine

## 2024-04-01 ENCOUNTER — Other Ambulatory Visit

## 2024-04-01 DIAGNOSIS — Z79899 Other long term (current) drug therapy: Secondary | ICD-10-CM | POA: Diagnosis not present

## 2024-04-01 DIAGNOSIS — Z0000 Encounter for general adult medical examination without abnormal findings: Secondary | ICD-10-CM | POA: Diagnosis not present

## 2024-04-01 DIAGNOSIS — D508 Other iron deficiency anemias: Secondary | ICD-10-CM

## 2024-04-01 DIAGNOSIS — E559 Vitamin D deficiency, unspecified: Secondary | ICD-10-CM | POA: Diagnosis not present

## 2024-04-01 LAB — IRON: Iron: 60 ug/dL (ref 42–145)

## 2024-04-01 LAB — CBC WITH DIFFERENTIAL/PLATELET
Basophils Absolute: 0 K/uL (ref 0.0–0.1)
Basophils Relative: 0.5 % (ref 0.0–3.0)
Eosinophils Absolute: 0 K/uL (ref 0.0–0.7)
Eosinophils Relative: 0.8 % (ref 0.0–5.0)
HCT: 37.3 % (ref 36.0–46.0)
Hemoglobin: 11.7 g/dL — ABNORMAL LOW (ref 12.0–15.0)
Lymphocytes Relative: 45.7 % (ref 12.0–46.0)
Lymphs Abs: 2.2 K/uL (ref 0.7–4.0)
MCHC: 31.5 g/dL (ref 30.0–36.0)
MCV: 81.4 fl (ref 78.0–100.0)
Monocytes Absolute: 0.5 K/uL (ref 0.1–1.0)
Monocytes Relative: 10.5 % (ref 3.0–12.0)
Neutro Abs: 2.1 K/uL (ref 1.4–7.7)
Neutrophils Relative %: 42.5 % — ABNORMAL LOW (ref 43.0–77.0)
Platelets: 239 K/uL (ref 150.0–400.0)
RBC: 4.58 Mil/uL (ref 3.87–5.11)
RDW: 13.6 % (ref 11.5–15.5)
WBC: 4.8 K/uL (ref 4.0–10.5)

## 2024-04-01 LAB — COMPREHENSIVE METABOLIC PANEL WITH GFR
ALT: 7 U/L (ref 0–35)
AST: 13 U/L (ref 0–37)
Albumin: 4.4 g/dL (ref 3.5–5.2)
Alkaline Phosphatase: 29 U/L — ABNORMAL LOW (ref 39–117)
BUN: 25 mg/dL — ABNORMAL HIGH (ref 6–23)
CO2: 28 meq/L (ref 19–32)
Calcium: 9.1 mg/dL (ref 8.4–10.5)
Chloride: 100 meq/L (ref 96–112)
Creatinine, Ser: 1.22 mg/dL — ABNORMAL HIGH (ref 0.40–1.20)
GFR: 48.63 mL/min — ABNORMAL LOW (ref >60.00)
Glucose, Bld: 80 mg/dL (ref 70–99)
Potassium: 3.8 meq/L (ref 3.5–5.1)
Sodium: 138 meq/L (ref 135–145)
Total Bilirubin: 0.3 mg/dL (ref 0.2–1.2)
Total Protein: 7.4 g/dL (ref 6.0–8.3)

## 2024-04-01 LAB — VITAMIN B12: Vitamin B-12: 248 pg/mL (ref 211–911)

## 2024-04-01 LAB — LIPID PANEL
Cholesterol: 169 mg/dL (ref 0–200)
HDL: 57.3 mg/dL (ref >39.00)
LDL Cholesterol: 95 mg/dL (ref 0–99)
NonHDL: 111.67
Total CHOL/HDL Ratio: 3
Triglycerides: 81 mg/dL (ref 0.0–149.0)
VLDL: 16.2 mg/dL (ref 0.0–40.0)

## 2024-04-01 LAB — VITAMIN D 25 HYDROXY (VIT D DEFICIENCY, FRACTURES): VITD: 32.55 ng/mL (ref 30.00–100.00)

## 2024-04-01 LAB — TSH: TSH: 2.02 u[IU]/mL (ref 0.35–5.50)

## 2024-04-01 LAB — FERRITIN: Ferritin: 51.3 ng/mL (ref 10.0–291.0)

## 2024-04-08 ENCOUNTER — Encounter: Payer: Self-pay | Admitting: Family Medicine

## 2024-04-08 ENCOUNTER — Telehealth: Payer: Self-pay | Admitting: Family Medicine

## 2024-04-08 ENCOUNTER — Ambulatory Visit (INDEPENDENT_AMBULATORY_CARE_PROVIDER_SITE_OTHER): Admitting: Family Medicine

## 2024-04-08 VITALS — BP 140/80 | HR 76 | Temp 97.9°F | Ht 61.0 in | Wt 119.1 lb

## 2024-04-08 DIAGNOSIS — K219 Gastro-esophageal reflux disease without esophagitis: Secondary | ICD-10-CM | POA: Diagnosis not present

## 2024-04-08 DIAGNOSIS — N641 Fat necrosis of breast: Secondary | ICD-10-CM | POA: Diagnosis not present

## 2024-04-08 DIAGNOSIS — Z79899 Other long term (current) drug therapy: Secondary | ICD-10-CM | POA: Diagnosis not present

## 2024-04-08 DIAGNOSIS — E559 Vitamin D deficiency, unspecified: Secondary | ICD-10-CM

## 2024-04-08 DIAGNOSIS — R944 Abnormal results of kidney function studies: Secondary | ICD-10-CM | POA: Insufficient documentation

## 2024-04-08 DIAGNOSIS — R030 Elevated blood-pressure reading, without diagnosis of hypertension: Secondary | ICD-10-CM

## 2024-04-08 DIAGNOSIS — F5101 Primary insomnia: Secondary | ICD-10-CM | POA: Diagnosis not present

## 2024-04-08 DIAGNOSIS — Z1231 Encounter for screening mammogram for malignant neoplasm of breast: Secondary | ICD-10-CM | POA: Insufficient documentation

## 2024-04-08 DIAGNOSIS — Z0000 Encounter for general adult medical examination without abnormal findings: Secondary | ICD-10-CM

## 2024-04-08 NOTE — Assessment & Plan Note (Signed)
 New Was taking a lot of ibuprofen for back pain and not drinking fluids  Instructed to stop nsaids Increase fluids -at least 48 oz daily   Follow up 2 wk for blood pressure re check and labs

## 2024-04-08 NOTE — Assessment & Plan Note (Signed)
 Encouraged to take at least 1000 international units D3 daily for bone and overall health   Last vitamin D  Lab Results  Component Value Date   VD25OH 32.55 04/01/2024

## 2024-04-08 NOTE — Assessment & Plan Note (Signed)
 BP: (!) 140/80  Discussed lifestyle change   Noted gfr down also   Will stop nsaid Increase fluid  DASH handout given Follow up 2 weeks

## 2024-04-08 NOTE — Assessment & Plan Note (Signed)
 Continues ambien  with caution  Ambien  cr 12.5 mg prn

## 2024-04-08 NOTE — Assessment & Plan Note (Signed)
 Reviewed health habits including diet and exercise and skin cancer prevention Reviewed appropriate screening tests for age  Also reviewed health mt list, fam hx and immunization status , as well as social and family history   See HPI Labs reviewed and ordered Health Maintenance  Topic Date Due   HIV Screening  Never done   Hepatitis C Screening  Never done   Hepatitis B Vaccine (1 of 3 - 19+ 3-dose series) Never done   Zoster (Shingles) Vaccine (1 of 2) Never done   Pneumococcal Vaccine for age over 80 (1 of 1 - PCV) Never done   Flu Shot  09/02/2024*   Pap with HPV screening  04/08/2025*   COVID-19 Vaccine (4 - 2025-26 season) 04/24/2026*   Breast Cancer Screening  12/05/2024   Colon Cancer Screening  04/13/2029   DTaP/Tdap/Td vaccine (2 - Td or Tdap) 03/27/2033   HPV Vaccine  Aged Out   Meningitis B Vaccine  Aged Out  *Topic was postponed. The date shown is not the original due date.    Declines breast exam and pelvic exam  On HRT  Mammo and us  ordered  No falls or fractures  Discussed fall prevention, supplements and exercise for bone density  PHQ 0
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
## 2024-04-08 NOTE — Assessment & Plan Note (Signed)
 No longer on ppi

## 2024-04-08 NOTE — Telephone Encounter (Signed)
I put the u/s order in.

## 2024-04-08 NOTE — Assessment & Plan Note (Signed)
 Overdue for diag mm and us  of right   Ordered Pt will schedule  Declines exam

## 2024-04-08 NOTE — Assessment & Plan Note (Addendum)
 Off ppi now

## 2024-04-08 NOTE — Telephone Encounter (Signed)
 Copied from CRM #[id number]. Topic: Referral - Request for Referral >> Apr 08, 2024 10:24 AM Eva FALCON wrote: Did the patient discuss referral with their provider in the last year? Yes (If No - schedule appointment) (If Yes - send message)  Appointment offered? No, it was someone form [facility] center calling in   Type of order/referral and detailed reason for visit: Bilateral diagnostic right breast ultrasound - due to delay of follow up last year   Preference of office, provider, location:  [facility]  [address] [place], [place] [address]  If referral order, have you been seen by this specialty before? Yes (If Yes, this issue or another issue? When? Where?  Can we respond through MyChart? Yes

## 2024-04-08 NOTE — Patient Instructions (Addendum)
 For bone health  Get vitamin D3 over the counter and take 1000 international units daily   Keep walking  Add some strength training to your routine, this is important for bone and brain health and can reduce your risk of falls and help your body use insulin properly and regulate weight  Light weights, exercise bands , and internet videos are a good way to start  Yoga (chair or regular), machines , floor exercises or a gym with machines are also good options    Aim for at least 48 oz of fluids daily/mostly water  Up to 64 oz is good as well  This is for kidney health   Nsaids like ibuprofen and naproxen (advil/aleve)  Stop the ibuprofen  Avoid excessive salt and processed foods  Follow up in 2 weeks for a visit for blood pressure We will re check labs that day      You have an order for:  [x]   3D Mammogram  []   Bone Density     Please call for appointment:   []   [facility]  [address] [place] [address]  [phone]  []   [facility]   [address]  [place], [place] [address]  [phone]  [x]   [facility]      [address] [place], [place]        [phone]         []   [facility]  [address] [place], [place]  [phone]  []  [facility]   [address]   [place], [place] [address]  [phone]  []  [facility]  [address] [place], [place] [address] [phone]    Make sure to wear two piece clothing  No lotions powders or deodorants the day of the appointment Make sure to bring picture ID and insurance card.  Bring list of medications you are currently taking including any supplements.   Schedule your screening mammogram through MyChart!   Select [facility] imaging sites can now  be scheduled through MyChart.  Log into your MyChart account.  Go to 'Visit' (or 'Appointments' if  on mobile App) --> Schedule an  Appointment  Under 'Select a Reason for Visit' choose the Mammogram  Screening option.  Complete the pre-visit questions  and select the time and place that  best fits your schedule

## 2024-04-08 NOTE — Assessment & Plan Note (Signed)
 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

## 2024-04-09 ENCOUNTER — Encounter: Payer: Self-pay | Admitting: Family Medicine

## 2024-04-09 ENCOUNTER — Other Ambulatory Visit: Payer: Self-pay | Admitting: Family Medicine

## 2024-04-09 DIAGNOSIS — R030 Elevated blood-pressure reading, without diagnosis of hypertension: Secondary | ICD-10-CM

## 2024-04-09 DIAGNOSIS — R944 Abnormal results of kidney function studies: Secondary | ICD-10-CM

## 2024-04-09 DIAGNOSIS — N641 Fat necrosis of breast: Secondary | ICD-10-CM

## 2024-04-09 DIAGNOSIS — Z0000 Encounter for general adult medical examination without abnormal findings: Secondary | ICD-10-CM

## 2024-04-09 DIAGNOSIS — K219 Gastro-esophageal reflux disease without esophagitis: Secondary | ICD-10-CM

## 2024-04-09 DIAGNOSIS — E559 Vitamin D deficiency, unspecified: Secondary | ICD-10-CM

## 2024-04-09 DIAGNOSIS — Z79899 Other long term (current) drug therapy: Secondary | ICD-10-CM

## 2024-04-09 DIAGNOSIS — Z1231 Encounter for screening mammogram for malignant neoplasm of breast: Secondary | ICD-10-CM

## 2024-04-09 DIAGNOSIS — F5101 Primary insomnia: Secondary | ICD-10-CM

## 2024-04-17 ENCOUNTER — Emergency Department (HOSPITAL_COMMUNITY)

## 2024-04-17 ENCOUNTER — Other Ambulatory Visit: Payer: Self-pay

## 2024-04-17 ENCOUNTER — Encounter (HOSPITAL_COMMUNITY): Payer: Self-pay

## 2024-04-17 ENCOUNTER — Emergency Department (HOSPITAL_COMMUNITY)
Admission: EM | Admit: 2024-04-17 | Discharge: 2024-04-17 | Disposition: A | Discharge status: Home / Self Care | Attending: Emergency Medicine | Admitting: Emergency Medicine

## 2024-04-17 DIAGNOSIS — R935 Abnormal findings on diagnostic imaging of other abdominal regions, including retroperitoneum: Secondary | ICD-10-CM | POA: Insufficient documentation

## 2024-04-17 DIAGNOSIS — K769 Liver disease, unspecified: Secondary | ICD-10-CM | POA: Diagnosis not present

## 2024-04-17 DIAGNOSIS — R7401 Elevation of levels of liver transaminase levels: Secondary | ICD-10-CM | POA: Insufficient documentation

## 2024-04-17 DIAGNOSIS — K828 Other specified diseases of gallbladder: Secondary | ICD-10-CM | POA: Diagnosis not present

## 2024-04-17 DIAGNOSIS — R1010 Upper abdominal pain, unspecified: Secondary | ICD-10-CM | POA: Diagnosis not present

## 2024-04-17 DIAGNOSIS — R079 Chest pain, unspecified: Secondary | ICD-10-CM | POA: Diagnosis not present

## 2024-04-17 DIAGNOSIS — R1013 Epigastric pain: Secondary | ICD-10-CM | POA: Diagnosis not present

## 2024-04-17 DIAGNOSIS — R0789 Other chest pain: Secondary | ICD-10-CM | POA: Diagnosis not present

## 2024-04-17 DIAGNOSIS — R059 Cough, unspecified: Secondary | ICD-10-CM | POA: Diagnosis not present

## 2024-04-17 LAB — COMPREHENSIVE METABOLIC PANEL WITH GFR
ALT: 185 U/L — ABNORMAL HIGH (ref 0–44)
AST: 384 U/L — ABNORMAL HIGH (ref 15–41)
Albumin: 4.6 g/dL (ref 3.5–5.0)
Alkaline Phosphatase: 72 U/L (ref 38–126)
Anion gap: 12 (ref 5–15)
BUN: 8 mg/dL (ref 6–20)
CO2: 26 mmol/L (ref 22–32)
Calcium: 9.7 mg/dL (ref 8.9–10.3)
Chloride: 102 mmol/L (ref 98–111)
Creatinine, Ser: 1.12 mg/dL — ABNORMAL HIGH (ref 0.44–1.00)
GFR, Estimated: 56 mL/min — ABNORMAL LOW (ref >60)
Glucose, Bld: 104 mg/dL — ABNORMAL HIGH (ref 70–99)
Potassium: 3.5 mmol/L (ref 3.5–5.1)
Sodium: 140 mmol/L (ref 135–145)
Total Bilirubin: 0.5 mg/dL (ref 0.0–1.2)
Total Protein: 8 g/dL (ref 6.5–8.1)

## 2024-04-17 LAB — TROPONIN T, HIGH SENSITIVITY
Troponin T High Sensitivity: <15 ng/L (ref 0–19)
Troponin T High Sensitivity: <15 ng/L (ref 0–19)

## 2024-04-17 LAB — CBC
HCT: 37.5 % (ref 36.0–46.0)
Hemoglobin: 11.9 g/dL — ABNORMAL LOW (ref 12.0–15.0)
MCH: 25.9 pg — ABNORMAL LOW (ref 26.0–34.0)
MCHC: 31.7 g/dL (ref 30.0–36.0)
MCV: 81.7 fL (ref 80.0–100.0)
Platelets: 286 K/uL (ref 150–400)
RBC: 4.59 MIL/uL (ref 3.87–5.11)
RDW: 13.4 % (ref 11.5–15.5)
WBC: 7.7 K/uL (ref 4.0–10.5)
nRBC: 0 % (ref 0.0–0.2)

## 2024-04-17 LAB — URINALYSIS, ROUTINE W REFLEX MICROSCOPIC
Bilirubin Urine: NEGATIVE
Glucose, UA: NEGATIVE mg/dL
Hgb urine dipstick: NEGATIVE
Ketones, ur: NEGATIVE mg/dL
Leukocytes,Ua: NEGATIVE
Nitrite: NEGATIVE
Protein, ur: NEGATIVE mg/dL
Specific Gravity, Urine: 1.011 (ref 1.005–1.030)
pH: 6 (ref 5.0–8.0)

## 2024-04-17 LAB — RESP PANEL BY RT-PCR (RSV, FLU A&B, COVID)  RVPGX2
Influenza A by PCR: NEGATIVE
Influenza B by PCR: NEGATIVE
Resp Syncytial Virus by PCR: NEGATIVE
SARS Coronavirus 2 by RT PCR: NEGATIVE

## 2024-04-17 LAB — ACETAMINOPHEN LEVEL: Acetaminophen (Tylenol), Serum: <10 ug/mL — ABNORMAL LOW (ref 10–30)

## 2024-04-17 LAB — LIPASE, BLOOD: Lipase: 53 U/L — ABNORMAL HIGH (ref 11–51)

## 2024-04-17 MED ORDER — SODIUM CHLORIDE 0.9 % IV BOLUS
1000.0000 mL | Freq: Once | INTRAVENOUS | Status: AC
  Administered 2024-04-17: 1000 mL via INTRAVENOUS

## 2024-04-17 MED ORDER — PANTOPRAZOLE SODIUM 40 MG IV SOLR
40.0000 mg | Freq: Once | INTRAVENOUS | Status: AC
  Administered 2024-04-17: 40 mg via INTRAVENOUS
  Filled 2024-04-17: qty 10

## 2024-04-17 MED ORDER — MORPHINE SULFATE (PF) 4 MG/ML IV SOLN
4.0000 mg | Freq: Once | INTRAVENOUS | Status: AC
  Administered 2024-04-17: 4 mg via INTRAVENOUS
  Filled 2024-04-17: qty 1

## 2024-04-17 MED ORDER — BENZONATATE 100 MG PO CAPS
100.0000 mg | ORAL_CAPSULE | Freq: Three times a day (TID) | ORAL | 0 refills | Status: AC
  Filled 2024-04-17: qty 21, 7d supply, fill #0

## 2024-04-17 MED ORDER — LANSOPRAZOLE 30 MG PO CPDR
30.0000 mg | DELAYED_RELEASE_CAPSULE | Freq: Two times a day (BID) | ORAL | 0 refills | Status: AC
  Filled 2024-04-17 – 2024-04-18 (×2): qty 30, 15d supply, fill #0

## 2024-04-17 MED ORDER — DICYCLOMINE HCL 20 MG PO TABS
20.0000 mg | ORAL_TABLET | Freq: Two times a day (BID) | ORAL | 0 refills | Status: AC
  Filled 2024-04-17: qty 20, 10d supply, fill #0

## 2024-04-17 MED ORDER — IOHEXOL 300 MG/ML  SOLN
100.0000 mL | Freq: Once | INTRAMUSCULAR | Status: AC | PRN
  Administered 2024-04-17: 80 mL via INTRAVENOUS

## 2024-04-17 MED ORDER — ONDANSETRON HCL 4 MG/2ML IJ SOLN
4.0000 mg | Freq: Once | INTRAMUSCULAR | Status: AC
  Administered 2024-04-17: 4 mg via INTRAVENOUS
  Filled 2024-04-17: qty 2

## 2024-04-17 NOTE — Discharge Instructions (Signed)
 You have been evaluated for your symptoms.  No acute concerning finding were noted on today's exam.  Your liver enzyme is elevated.  Please discuss with your doctor and have a recheck at your earliest convenience.  Avoid Tylenol  use as it can be harmful for your liver.  Incidentally there are 2 cystic lesions in your liver measuring up to 2.3 cm.  Discussed this with your primary care provider and request for a liver MRI outpatient for further assessment.  Your symptoms may also be due to H. pylori infection but discussed this with your doctor to have it tested so that way you can be treated appropriately.  Return to ER if you have any concern.

## 2024-04-17 NOTE — ED Triage Notes (Signed)
 Pt arrives via POV. Pt c/o abdominal pain, nausea, vomiting, and diarrhea for the past two weeks. She reports she has also been experiencing chest pain. Pt is AxOx4.

## 2024-04-17 NOTE — ED Provider Notes (Signed)
 [place] EMERGENCY DEPARTMENT AT [facility] Provider Note   CSN: [id number] Arrival date & time: 04/17/24  1139     Patient presents with: Abdominal Pain and Chest Pain   Crystal Morris is a 58 y.o. female.   The history is provided by the patient and medical records. No language interpreter was used.  Abdominal Pain Associated symptoms: chest pain   Chest Pain Associated symptoms: abdominal pain      58 year old female with history of GERD, anemia, peptic ulcer disease presenting with complaint of abdominal pain.  Patient report having upper abdominal discomfort ongoing for the past 2 weeks.  Pain described as a sharp crampy achy sensation that radiates towards the chest.  She also endorsed persistent nausea vomiting diarrhea as well.  She endorsed having fever and chills without bodyaches.  She endorsed occasional nonproductive cough.  She has had similar symptoms like this related to stomach ulcer and she was using some over-the-counter medication including omeprazole  and Pepcid  but without adequate relief.  She has not noticed any blood in her vomitus blood in her stool.  No urinary symptoms.  No recent antibiotic use.  She admits she was taking ibuprofen quite a bit for her back pain but her doctor told her to stop taking it so she stopped approximately a week ago.  Denies heavy alcohol use.  She still has an intact appendix.  Patient does admits to taking quite a bit of Tylenol  on a regular basis for her back pain and abdominal cramping.  States she takes roughly about 4000 mg daily and her last dose was yesterday.  She has been doing so for nearly a month.  She did mention having a colonoscopy done last year that was normal and endoscopy that shows gastric ulcers.  Prior to Admission medications   Medication Sig Start Date End Date Taking? Authorizing Provider  diphenhydrAMINE (BENADRYL) 25 MG tablet Take 25 mg by mouth 2 (two) times daily as needed for itching.     [provider]  estrogen, conjugated,-medroxyprogesterone (PREMPRO ) 0.3-1.5 MG tablet Take 1 tablet by mouth daily. 04/12/23   Tower, Laine LABOR, MD  polyethylene glycol powder (GLYCOLAX/MIRALAX) 17 GM/SCOOP powder Take 0.5 Containers by mouth daily as needed for moderate constipation or mild constipation.    [provider]  zolpidem  (AMBIEN  CR) 12.5 MG CR tablet Take 1 tablet (12.5 mg total) by mouth at bedtime. 02/07/24   Tower, Laine LABOR, MD    Allergies: Oxycodone     Review of Systems  Cardiovascular:  Positive for chest pain.  Gastrointestinal:  Positive for abdominal pain.  All other systems reviewed and are negative.   Updated Vital Signs BP (!) 162/107 (BP Location: Left Arm)   Pulse 95   Temp 100.3 F (37.9 C) (Oral)   Resp 17   SpO2 99%   Physical Exam Vitals and nursing note reviewed.  Constitutional:      General: She is not in acute distress.    Appearance: She is well-developed.  HENT:     Head: Atraumatic.  Eyes:     Conjunctiva/sclera: Conjunctivae normal.  Cardiovascular:     Rate and Rhythm: Normal rate and regular rhythm.     Pulses: Normal pulses.     Heart sounds: Normal heart sounds.  Pulmonary:     Effort: Pulmonary effort is normal.  Abdominal:     Palpations: Abdomen is soft.     Tenderness: There is no abdominal tenderness.     Comments: Negative  Murphy sign, no pain at McBurney's point  Musculoskeletal:     Cervical back: Neck supple.  Skin:    Findings: No rash.  Neurological:     Mental Status: She is alert. Mental status is at baseline.  Psychiatric:        Mood and Affect: Mood normal.     (all labs ordered are listed, but only abnormal results are displayed) Labs Reviewed  LIPASE, BLOOD - Abnormal; Notable for the following components:      Result Value   Lipase 53 (*)    All other components within normal limits  COMPREHENSIVE METABOLIC PANEL WITH GFR - Abnormal; Notable for the following components:   Glucose,  Bld 104 (*)    Creatinine, Ser 1.12 (*)    AST 384 (*)    ALT 185 (*)    GFR, Estimated 56 (*)    All other components within normal limits  CBC - Abnormal; Notable for the following components:   Hemoglobin 11.9 (*)    MCH 25.9 (*)    All other components within normal limits  URINALYSIS, ROUTINE W REFLEX MICROSCOPIC - Abnormal; Notable for the following components:   Color, Urine STRAW (*)    All other components within normal limits  ACETAMINOPHEN  LEVEL - Abnormal; Notable for the following components:   Acetaminophen  (Tylenol ), Serum <10 (*)    All other components within normal limits  RESP PANEL BY RT-PCR (RSV, FLU A&B, COVID)  RVPGX2  TROPONIN T, HIGH SENSITIVITY  TROPONIN T, HIGH SENSITIVITY    EKG: EKG Interpretation Date/Time:  Thursday April 17 2024 11:50:07 EST Ventricular Rate:  93 PR Interval:  198 QRS Duration:  77 QT Interval:  354 QTC Calculation: 441 R Axis:   73  Text Interpretation: Sinus rhythm Atrial premature complex Borderline prolonged PR interval Right atrial enlargement Confirmed by Garrick Charleston [id number]) on 04/17/2024 4:43:34 PM  Radiology: US  Abdomen Limited RUQ (LIVER/GB) Result Date: 04/17/2024 EXAM: Right Upper Quadrant Abdominal Ultrasound 04/17/2024 08:32:37 PM TECHNIQUE: Real-time ultrasonography of the right upper quadrant of the abdomen was performed. COMPARISON: Comparison with same day CT abdomen and pelvis. CLINICAL HISTORY: Upper abdominal pain. FINDINGS: LIVER: The liver demonstrates normal echogenicity. Patent portal vein with antegrade flow. No intrahepatic biliary ductal dilatation. No sonographic correlate for the indeterminate liver lesion seen on same day CT abdomen and pelvis. The recommendation for further evaluation with MRI remains. BILIARY SYSTEM: No pericholecystic fluid or wall thickening. No cholelithiasis. Negative sonographic Murphy's sign. Common bile duct measures 4 mm. OTHER: No right upper quadrant ascites.  IMPRESSION: 1. No evidence of acute cholecystitis. 2. No sonographic correlate for the indeterminate liver lesion seen on same day CT abdomen and pelvis. The recommendation for further evaluation with non-emergent hepatic protocol MRI remains. Electronically signed by: Norman Gatlin MD 04/17/2024 08:39 PM EST RP Workstation: [id number]   CT ABDOMEN PELVIS W CONTRAST Result Date: 04/17/2024 CLINICAL DATA:  Epigastric pain EXAM: CT ABDOMEN AND PELVIS WITH CONTRAST TECHNIQUE: Multidetector CT imaging of the abdomen and pelvis was performed using the standard protocol following bolus administration of intravenous contrast. RADIATION DOSE REDUCTION: This exam was performed according to the departmental dose-optimization program which includes automated exposure control, adjustment of the mA and/or kV according to patient size and/or use of iterative reconstruction technique. CONTRAST:  80mL OMNIPAQUE  IOHEXOL  300 MG/ML  SOLN COMPARISON:  CT abdomen and pelvis 09/30/2018. FINDINGS: Lower chest: There are 2 heterogeneous hypodense lesions in the right lobe of the liver measuring up to 2.3  cm. These are indeterminate, possibly hemangiomas. These were not definitely seen in 2023. There is mild gallbladder wall edema. There is no biliary ductal dilatation. Hepatobiliary: No focal liver abnormality is seen. No gallstones, gallbladder wall thickening, or biliary dilatation. Pancreas: Unremarkable. No pancreatic ductal dilatation or surrounding inflammatory changes. Spleen: Normal in size without focal abnormality. Adrenals/Urinary Tract: Adrenal glands are unremarkable. Kidneys are normal, without renal calculi, focal lesion, or hydronephrosis. Bladder is unremarkable. Stomach/Bowel: Stomach is within normal limits. Appendix is not seen. No evidence of bowel wall thickening, distention, or inflammatory changes. Vascular/Lymphatic: No significant vascular findings are present. No enlarged abdominal or pelvic lymph nodes.  Reproductive: Uterus and bilateral adnexa are unremarkable. Other: No abdominal wall hernia or abnormality. No abdominopelvic ascites. Musculoskeletal: No acute or significant osseous findings. IMPRESSION: 1. Mild gallbladder wall edema. Correlate clinically for cholecystitis. 2. Indeterminate liver lesions measuring up to 2.3 cm. Recommend further evaluation with MRI. Electronically Signed   By: Greig Pique M.D.   On: 04/17/2024 18:42   DG Chest 2 View Result Date: 04/17/2024 EXAM: 2 VIEW(S) XRAY OF THE CHEST 04/17/2024 12:19:22 PM COMPARISON: 09/14/2021 CLINICAL HISTORY: chest pain FINDINGS: LUNGS AND PLEURA: No focal pulmonary opacity. No pleural effusion. No pneumothorax. HEART AND MEDIASTINUM: No acute abnormality of the cardiac and mediastinal silhouettes. BONES AND SOFT TISSUES: No acute osseous abnormality. IMPRESSION: 1. No acute cardiopulmonary pathology. Electronically signed by: Camellia Candle MD 04/17/2024 01:04 PM EST RP Workstation: [id number]     Procedures   Medications Ordered in the ED  sodium chloride  0.9 % bolus 1,000 mL (1,000 mLs Intravenous New Bag/Given 04/17/24 1817)  morphine  (PF) 4 MG/ML injection 4 mg (4 mg Intravenous Given 04/17/24 1811)  ondansetron  (ZOFRAN ) injection 4 mg (4 mg Intravenous Given 04/17/24 1817)  pantoprazole  (PROTONIX ) injection 40 mg (40 mg Intravenous Given 04/17/24 1817)  iohexol  (OMNIPAQUE ) 300 MG/ML solution 100 mL (80 mLs Intravenous Contrast Given 04/17/24 1829)                                    Medical Decision Making Amount and/or Complexity of Data Reviewed Labs: ordered. Radiology: ordered.  Risk Prescription drug management.   BP (!) 162/107 (BP Location: Left Arm)   Pulse 95   Temp 100.3 F (37.9 C) (Oral)   Resp 17   SpO2 99%   82:83 PM   58 year old female with history of GERD, anemia, peptic ulcer disease presenting with complaint of abdominal pain.  Patient report having upper abdominal discomfort ongoing for the  past 2 weeks.  Pain described as a sharp crampy achy sensation that radiates towards the chest.  She also endorsed persistent nausea vomiting diarrhea as well.  She endorsed having fever and chills without bodyaches.  She endorsed occasional nonproductive cough.  She has had similar symptoms like this related to stomach ulcer and she was using some over-the-counter medication including omeprazole  and Pepcid  but without adequate relief.  She has not noticed any blood in her vomitus blood in her stool.  No urinary symptoms.  No recent antibiotic use.  She admits she was taking ibuprofen quite a bit for her back pain but her doctor told her to stop taking it so she stopped approximately a week ago.  Denies heavy alcohol use.  She still has an intact appendix.  Patient does admits to taking quite a bit of Tylenol  on a regular basis for her back pain and abdominal  cramping.  States she takes roughly about 4000 mg daily and her last dose was yesterday.  She has been doing so for nearly a month.  She did mention having a colonoscopy done last year that was normal and endoscopy that shows gastric ulcers.  On exam, patient is resting comfortably appears to be in no acute discomfort.  She has a fairly benign abdominal exam.  She is mentating appropriately.  -Labs ordered, independently viewed and interpreted by me.  Labs remarkable for evidence of transaminitis with AST 384, ALT 185, normal total bili and normal alk phos.  Creatinine of 1.12 with normal BUN.  Lipase is 53.  Normal WBC normal troponin and urinalysis without signs of UTI -The patient was maintained on a cardiac monitor.  I personally viewed and interpreted the cardiac monitored which showed an underlying rhythm of: Normal sinus rhythm -Imaging independently viewed and interpreted by me and I agree with radiologist's interpretation.  Result remarkable for chest x-ray reassuring but given evidence of transaminitis, will obtain abdominal pelvis CT scan for  further assessment.  -CT of the abdomen pelvis shows mild gallbladder wall edema.  There is also indeterminate liver lesion measuring 2.3 cm.  Radiologist recommended further evaluation with MRI.  I have obtained a limited abdominal ultrasound which shows no evidence of acute cholecystitis.  Radiologist still recommend nonemergent hepatic protocol MRI for hepatic lesion. -This patient presents to the ED for concern of abdominal pain, this involves an extensive number of treatment options, and is a complaint that carries with it a high risk of complications and morbidity.  The differential diagnosis includes pancreatitis, cholecystitis, peptic ulcer disease, viral illness, appendicitis, hepatitis, H.pylori -Co morbidities that complicate the patient evaluation includes GERD, anemia, -Treatment includes morphine , Zofran , IV fluid -Reevaluation of the patient after these medicines showed that the patient improved -PCP office notes or outside notes reviewed -Escalation to admission/observation considered: patients feels much better, is comfortable with discharge, and will follow up with PCP -Prescription medication considered, patient comfortable with Tessalon Perles, Protonix , Bentyl  -Social Determinant of Health considered which includes food insecurity      Final diagnoses:  Upper abdominal pain  Transaminitis  Abnormal abdominal CT scan  1  ED Discharge Orders          Ordered    lansoprazole (PREVACID) 30 MG capsule  2 times daily before meals        04/17/24 2059    dicyclomine  (BENTYL ) 20 MG tablet  2 times daily        04/17/24 2059    benzonatate (TESSALON) 100 MG capsule  Every 8 hours        04/17/24 2059          Short course     Nivia Colon, DEVONNA 04/17/24 2101    Garrick Charleston, MD 04/18/24 2230

## 2024-04-18 ENCOUNTER — Other Ambulatory Visit (HOSPITAL_COMMUNITY): Payer: Self-pay

## 2024-04-18 ENCOUNTER — Other Ambulatory Visit (HOSPITAL_BASED_OUTPATIENT_CLINIC_OR_DEPARTMENT_OTHER): Payer: Self-pay

## 2024-04-22 ENCOUNTER — Other Ambulatory Visit (HOSPITAL_COMMUNITY): Payer: Self-pay

## 2024-04-22 ENCOUNTER — Encounter: Payer: Self-pay | Admitting: Family Medicine

## 2024-04-22 ENCOUNTER — Ambulatory Visit: Payer: Self-pay | Admitting: Family Medicine

## 2024-04-22 ENCOUNTER — Other Ambulatory Visit

## 2024-04-22 ENCOUNTER — Other Ambulatory Visit: Payer: Self-pay

## 2024-04-22 ENCOUNTER — Ambulatory Visit

## 2024-04-22 ENCOUNTER — Ambulatory Visit (INDEPENDENT_AMBULATORY_CARE_PROVIDER_SITE_OTHER): Admitting: Family Medicine

## 2024-04-22 VITALS — BP 141/88 | HR 78 | Temp 97.8°F | Ht 61.0 in | Wt 120.2 lb

## 2024-04-22 DIAGNOSIS — R052 Subacute cough: Secondary | ICD-10-CM | POA: Diagnosis not present

## 2024-04-22 DIAGNOSIS — Z79899 Other long term (current) drug therapy: Secondary | ICD-10-CM

## 2024-04-22 DIAGNOSIS — N641 Fat necrosis of breast: Secondary | ICD-10-CM | POA: Diagnosis not present

## 2024-04-22 DIAGNOSIS — R519 Headache, unspecified: Secondary | ICD-10-CM

## 2024-04-22 DIAGNOSIS — R748 Abnormal levels of other serum enzymes: Secondary | ICD-10-CM | POA: Diagnosis not present

## 2024-04-22 DIAGNOSIS — K219 Gastro-esophageal reflux disease without esophagitis: Secondary | ICD-10-CM | POA: Diagnosis not present

## 2024-04-22 DIAGNOSIS — R1013 Epigastric pain: Secondary | ICD-10-CM

## 2024-04-22 DIAGNOSIS — R944 Abnormal results of kidney function studies: Secondary | ICD-10-CM | POA: Diagnosis not present

## 2024-04-22 DIAGNOSIS — R030 Elevated blood-pressure reading, without diagnosis of hypertension: Secondary | ICD-10-CM | POA: Diagnosis not present

## 2024-04-22 DIAGNOSIS — K769 Liver disease, unspecified: Secondary | ICD-10-CM | POA: Insufficient documentation

## 2024-04-22 DIAGNOSIS — R059 Cough, unspecified: Secondary | ICD-10-CM | POA: Insufficient documentation

## 2024-04-22 LAB — BASIC METABOLIC PANEL WITH GFR
BUN: 15 mg/dL (ref 6–23)
CO2: 30 meq/L (ref 19–32)
Calcium: 9.4 mg/dL (ref 8.4–10.5)
Chloride: 99 meq/L (ref 96–112)
Creatinine, Ser: 0.89 mg/dL (ref 0.40–1.20)
GFR: 70.98 mL/min (ref >60.00)
Glucose, Bld: 90 mg/dL (ref 70–99)
Potassium: 3.9 meq/L (ref 3.5–5.1)
Sodium: 137 meq/L (ref 135–145)

## 2024-04-22 LAB — CBC WITH DIFFERENTIAL/PLATELET
Basophils Absolute: 0 K/uL (ref 0.0–0.1)
Basophils Relative: 0.7 % (ref 0.0–3.0)
Eosinophils Absolute: 0 K/uL (ref 0.0–0.7)
Eosinophils Relative: 0.2 % (ref 0.0–5.0)
HCT: 36.7 % (ref 36.0–46.0)
Hemoglobin: 11.5 g/dL — ABNORMAL LOW (ref 12.0–15.0)
Lymphocytes Relative: 41.6 % (ref 12.0–46.0)
Lymphs Abs: 2.5 K/uL (ref 0.7–4.0)
MCHC: 31.3 g/dL (ref 30.0–36.0)
MCV: 81.2 fl (ref 78.0–100.0)
Monocytes Absolute: 0.6 K/uL (ref 0.1–1.0)
Monocytes Relative: 9.3 % (ref 3.0–12.0)
Neutro Abs: 2.9 K/uL (ref 1.4–7.7)
Neutrophils Relative %: 48.2 % (ref 43.0–77.0)
Platelets: 346 K/uL (ref 150.0–400.0)
RBC: 4.52 Mil/uL (ref 3.87–5.11)
RDW: 13.3 % (ref 11.5–15.5)
WBC: 6 K/uL (ref 4.0–10.5)

## 2024-04-22 LAB — HEPATIC FUNCTION PANEL
ALT: 45 U/L — ABNORMAL HIGH (ref 0–35)
AST: 22 U/L (ref 0–37)
Albumin: 4.4 g/dL (ref 3.5–5.2)
Alkaline Phosphatase: 47 U/L (ref 39–117)
Bilirubin, Direct: 0.1 mg/dL (ref 0.0–0.3)
Total Bilirubin: 0.4 mg/dL (ref 0.2–1.2)
Total Protein: 7.7 g/dL (ref 6.0–8.3)

## 2024-04-22 MED ORDER — BENZONATATE 200 MG PO CAPS
200.0000 mg | ORAL_CAPSULE | Freq: Three times a day (TID) | ORAL | 3 refills | Status: AC | PRN
  Filled 2024-04-22: qty 30, 10d supply, fill #0

## 2024-04-22 MED ORDER — PREMPRO 0.3-1.5 MG PO TABS
1.0000 | ORAL_TABLET | Freq: Every day | ORAL | 0 refills | Status: AC
  Filled 2024-04-22 – 2024-04-25 (×2): qty 84, 84d supply, fill #0

## 2024-04-22 NOTE — Assessment & Plan Note (Signed)
 Seen in ER for cough and GI symptoms  Reviewed hospital records, lab results and studies in detail  Cxr clear/reassuring  Tessalon 100 not helpful- can increase to 200 mg tid prn  Also delsym over the counter  Reassuring exam  Update if not starting to improve in a week or if worsening  Call back and Er precautions noted in detail today

## 2024-04-22 NOTE — Assessment & Plan Note (Signed)
 Is back on ppi Prevacid 30 mg bid after GI episode and Er visit

## 2024-04-22 NOTE — Assessment & Plan Note (Signed)
 This did initially improve with holding nsaid  Re check today  Working to hydrate but did have ER visit for GI symptoms  No longer vomiting or diarrhea

## 2024-04-22 NOTE — Assessment & Plan Note (Signed)
 More lately  In setting of elevated blood pressure  Pt mentions at end of visit  Reassuring exam Will plan follow up for this  Blood pressure treatment may help
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
## 2024-04-22 NOTE — Assessment & Plan Note (Signed)
 In setting of multiple other problems BP: (!) 141/88  Off nsaid now Hydrating Just had ER visit for acute gi/resp symptoms- better now   Discussed lifestyle habits Lab today  Will plan follow up and may need treatment in future

## 2024-04-22 NOTE — Assessment & Plan Note (Signed)
 Indeterminate liver lesions measuring up to 2.3 cm on CT when at Er for GI symptoms Will plan MRI for this

## 2024-04-22 NOTE — Patient Instructions (Addendum)
 Continue delsym and benzonatate for cough  Let us  know if no improvement   Go down on the generic prevacid 30 mg from twice daily to once daily  Let us  know if the abdominal symptoms come back   Labs for kidney/liver and cbc today    Then I will work on MRI order in [place]   Please let us  know if you don't hear in 1-2 weeks to set that up (mychart message or call or letter)    Get your mammogram    We will get you back to discuss the blood pressure and headaches   Continue to avoid tylenol  and nsaids Keep hydrating

## 2024-04-22 NOTE — Assessment & Plan Note (Signed)
 Breast imaging is planned later today

## 2024-04-22 NOTE — Assessment & Plan Note (Signed)
 Is back on prevacid 30 mg bid after bad episode of epigastric pain with n/v/d and cough  Seen in ER Reviewed hospital records, lab results and studies in detail    Will continue this as symptoms have improved and continue to monitor

## 2024-04-22 NOTE — Assessment & Plan Note (Signed)
 In ER  After tylenol  dosing for back pain (when holding nsaid) Also during episode of n/v/d  ? Gallbladder edema on Ct / reassuring us   There indeterminate liver lesions on CT-will order US  for this

## 2024-04-22 NOTE — Assessment & Plan Note (Signed)
 ER visit noted for this with n/v/d and cough  Reviewed hospital records, lab results and studies in detail   Much improved Continues prevacid 30 mg bid Also off nsaids

## 2024-04-23 ENCOUNTER — Other Ambulatory Visit: Payer: Self-pay

## 2024-04-23 ENCOUNTER — Other Ambulatory Visit (HOSPITAL_COMMUNITY): Payer: Self-pay

## 2024-04-25 ENCOUNTER — Other Ambulatory Visit: Payer: Self-pay

## 2024-04-25 ENCOUNTER — Other Ambulatory Visit (HOSPITAL_COMMUNITY): Payer: Self-pay

## 2024-05-06 ENCOUNTER — Ambulatory Visit
Admission: RE | Admit: 2024-05-06 | Discharge: 2024-05-06 | Disposition: A | Source: Ambulatory Visit | Attending: Family Medicine | Admitting: Family Medicine

## 2024-05-06 ENCOUNTER — Ambulatory Visit

## 2024-05-06 ENCOUNTER — Ambulatory Visit: Payer: Self-pay | Admitting: Family Medicine

## 2024-05-06 DIAGNOSIS — N641 Fat necrosis of breast: Secondary | ICD-10-CM

## 2024-05-06 DIAGNOSIS — R928 Other abnormal and inconclusive findings on diagnostic imaging of breast: Secondary | ICD-10-CM | POA: Diagnosis not present

## 2024-05-15 ENCOUNTER — Other Ambulatory Visit: Payer: Self-pay | Admitting: Family Medicine

## 2024-05-15 ENCOUNTER — Other Ambulatory Visit (HOSPITAL_COMMUNITY): Payer: Self-pay

## 2024-05-15 ENCOUNTER — Other Ambulatory Visit: Payer: Self-pay

## 2024-05-15 MED ORDER — ZOLPIDEM TARTRATE ER 12.5 MG PO TBCR
12.5000 mg | EXTENDED_RELEASE_TABLET | Freq: Every day | ORAL | 0 refills | Status: AC
  Filled 2024-05-15: qty 90, 90d supply, fill #0

## 2024-05-15 NOTE — Telephone Encounter (Signed)
 Name of Medication: Ambien  Name of Pharmacy: [facility]  Last Fill or Written Date and Quantity: 02/07/24 #90 tabs/ 0 refill Last Office Visit and Type: F/u 04/22/24 Next Office Visit and Type: none scheduled
